# Patient Record
Sex: Female | Born: 1940 | Race: White | Hispanic: No | Smoking: Never smoker
Health system: Southern US, Community
[De-identification: ages and names within clinical notes are randomized; demographics above are authoritative.]

## PROBLEM LIST (undated history)

## (undated) DIAGNOSIS — F32A Depression, unspecified: Secondary | ICD-10-CM

## (undated) DIAGNOSIS — E039 Hypothyroidism, unspecified: Secondary | ICD-10-CM

## (undated) DIAGNOSIS — F329 Major depressive disorder, single episode, unspecified: Secondary | ICD-10-CM

## (undated) DIAGNOSIS — I2510 Atherosclerotic heart disease of native coronary artery without angina pectoris: Secondary | ICD-10-CM

## (undated) DIAGNOSIS — I639 Cerebral infarction, unspecified: Secondary | ICD-10-CM

## (undated) DIAGNOSIS — N289 Disorder of kidney and ureter, unspecified: Secondary | ICD-10-CM

## (undated) DIAGNOSIS — T8859XA Other complications of anesthesia, initial encounter: Secondary | ICD-10-CM

## (undated) DIAGNOSIS — I739 Peripheral vascular disease, unspecified: Secondary | ICD-10-CM

## (undated) DIAGNOSIS — N200 Calculus of kidney: Secondary | ICD-10-CM

## (undated) DIAGNOSIS — R5382 Chronic fatigue, unspecified: Secondary | ICD-10-CM

## (undated) DIAGNOSIS — I998 Other disorder of circulatory system: Secondary | ICD-10-CM

## (undated) DIAGNOSIS — D649 Anemia, unspecified: Secondary | ICD-10-CM

## (undated) DIAGNOSIS — Z992 Dependence on renal dialysis: Secondary | ICD-10-CM

## (undated) DIAGNOSIS — G629 Polyneuropathy, unspecified: Secondary | ICD-10-CM

## (undated) DIAGNOSIS — T884XXA Failed or difficult intubation, initial encounter: Secondary | ICD-10-CM

## (undated) DIAGNOSIS — M1990 Unspecified osteoarthritis, unspecified site: Secondary | ICD-10-CM

## (undated) DIAGNOSIS — N186 End stage renal disease: Secondary | ICD-10-CM

## (undated) DIAGNOSIS — G9332 Myalgic encephalomyelitis/chronic fatigue syndrome: Secondary | ICD-10-CM

## (undated) DIAGNOSIS — E669 Obesity, unspecified: Secondary | ICD-10-CM

## (undated) DIAGNOSIS — K5900 Constipation, unspecified: Secondary | ICD-10-CM

## (undated) DIAGNOSIS — E785 Hyperlipidemia, unspecified: Secondary | ICD-10-CM

## (undated) DIAGNOSIS — T4145XA Adverse effect of unspecified anesthetic, initial encounter: Secondary | ICD-10-CM

## (undated) DIAGNOSIS — I10 Essential (primary) hypertension: Secondary | ICD-10-CM

## (undated) DIAGNOSIS — M8580 Other specified disorders of bone density and structure, unspecified site: Secondary | ICD-10-CM

## (undated) DIAGNOSIS — J310 Chronic rhinitis: Secondary | ICD-10-CM

## (undated) HISTORY — DX: Other specified disorders of bone density and structure, unspecified site: M85.80

## (undated) HISTORY — DX: Major depressive disorder, single episode, unspecified: F32.9

## (undated) HISTORY — DX: Obesity, unspecified: E66.9

## (undated) HISTORY — DX: Depression, unspecified: F32.A

## (undated) HISTORY — DX: Hyperlipidemia, unspecified: E78.5

## (undated) HISTORY — PX: KNEE ARTHROSCOPY: SUR90

## (undated) HISTORY — DX: Essential (primary) hypertension: I10

## (undated) HISTORY — DX: Disorder of kidney and ureter, unspecified: N28.9

## (undated) HISTORY — DX: Chronic rhinitis: J31.0

## (undated) HISTORY — DX: Chronic fatigue, unspecified: R53.82

## (undated) HISTORY — PX: BACK SURGERY: SHX140

## (undated) HISTORY — PX: CARDIAC VALVE REPLACEMENT: SHX585

## (undated) HISTORY — PX: AORTIC VALVE REPLACEMENT: SHX41

## (undated) HISTORY — DX: Hypothyroidism, unspecified: E03.9

## (undated) HISTORY — DX: Anemia, unspecified: D64.9

## (undated) HISTORY — DX: Atherosclerotic heart disease of native coronary artery without angina pectoris: I25.10

## (undated) HISTORY — DX: Myalgic encephalomyelitis/chronic fatigue syndrome: G93.32

## (undated) HISTORY — DX: Unspecified osteoarthritis, unspecified site: M19.90

## (undated) HISTORY — PX: CORONARY ARTERY BYPASS GRAFT: SHX141

---

## 1974-11-09 HISTORY — PX: ABDOMINAL HYSTERECTOMY: SHX81

## 2001-05-02 ENCOUNTER — Encounter: Payer: Self-pay | Admitting: Emergency Medicine

## 2001-05-02 ENCOUNTER — Emergency Department (HOSPITAL_COMMUNITY): Admission: EM | Admit: 2001-05-02 | Discharge: 2001-05-02 | Payer: Self-pay | Admitting: Emergency Medicine

## 2009-02-27 ENCOUNTER — Encounter: Admission: RE | Admit: 2009-02-27 | Discharge: 2009-02-27 | Payer: Self-pay | Admitting: Orthopaedic Surgery

## 2009-12-26 ENCOUNTER — Ambulatory Visit (HOSPITAL_COMMUNITY): Admission: RE | Admit: 2009-12-26 | Discharge: 2009-12-26 | Payer: Self-pay | Admitting: Cardiology

## 2009-12-26 ENCOUNTER — Encounter: Payer: Self-pay | Admitting: Surgery

## 2009-12-26 ENCOUNTER — Ambulatory Visit: Payer: Self-pay | Admitting: Surgery

## 2010-01-21 ENCOUNTER — Ambulatory Visit: Payer: Self-pay | Admitting: Surgery

## 2010-01-22 ENCOUNTER — Encounter: Payer: Self-pay | Admitting: Surgery

## 2010-01-23 ENCOUNTER — Inpatient Hospital Stay (HOSPITAL_COMMUNITY): Admission: RE | Admit: 2010-01-23 | Discharge: 2010-02-01 | Payer: Self-pay | Admitting: Surgery

## 2010-01-23 ENCOUNTER — Ambulatory Visit: Payer: Self-pay | Admitting: Surgery

## 2010-01-23 ENCOUNTER — Encounter: Payer: Self-pay | Admitting: Surgery

## 2010-02-24 ENCOUNTER — Encounter: Admission: RE | Admit: 2010-02-24 | Discharge: 2010-02-24 | Payer: Self-pay | Admitting: Surgery

## 2010-02-24 ENCOUNTER — Ambulatory Visit: Payer: Self-pay | Admitting: Surgery

## 2011-01-30 LAB — CBC
HCT: 22.8 % — ABNORMAL LOW (ref 36.0–46.0)
HCT: 26 % — ABNORMAL LOW (ref 36.0–46.0)
HCT: 26.7 % — ABNORMAL LOW (ref 36.0–46.0)
HCT: 27.4 % — ABNORMAL LOW (ref 36.0–46.0)
HCT: 28.4 % — ABNORMAL LOW (ref 36.0–46.0)
HCT: 28.6 % — ABNORMAL LOW (ref 36.0–46.0)
HCT: 29 % — ABNORMAL LOW (ref 36.0–46.0)
HCT: 31.3 % — ABNORMAL LOW (ref 36.0–46.0)
Hemoglobin: 9 g/dL — ABNORMAL LOW (ref 12.0–15.0)
Hemoglobin: 9.3 g/dL — ABNORMAL LOW (ref 12.0–15.0)
Hemoglobin: 9.6 g/dL — ABNORMAL LOW (ref 12.0–15.0)
Hemoglobin: 9.7 g/dL — ABNORMAL LOW (ref 12.0–15.0)
MCHC: 33.9 g/dL (ref 30.0–36.0)
MCHC: 34 g/dL (ref 30.0–36.0)
MCHC: 34 g/dL (ref 30.0–36.0)
MCHC: 34.3 g/dL (ref 30.0–36.0)
MCV: 88.1 fL (ref 78.0–100.0)
MCV: 89.2 fL (ref 78.0–100.0)
MCV: 89.6 fL (ref 78.0–100.0)
MCV: 89.6 fL (ref 78.0–100.0)
Platelets: 123 10*3/uL — ABNORMAL LOW (ref 150–400)
Platelets: 141 10*3/uL — ABNORMAL LOW (ref 150–400)
Platelets: 156 10*3/uL (ref 150–400)
Platelets: 162 10*3/uL (ref 150–400)
Platelets: 170 10*3/uL (ref 150–400)
Platelets: 256 10*3/uL (ref 150–400)
Platelets: 295 10*3/uL (ref 150–400)
Platelets: 330 10*3/uL (ref 150–400)
RBC: 2.94 MIL/uL — ABNORMAL LOW (ref 3.87–5.11)
RBC: 2.97 MIL/uL — ABNORMAL LOW (ref 3.87–5.11)
RBC: 3.06 MIL/uL — ABNORMAL LOW (ref 3.87–5.11)
RBC: 3.26 MIL/uL — ABNORMAL LOW (ref 3.87–5.11)
RBC: 3.56 MIL/uL — ABNORMAL LOW (ref 3.87–5.11)
RDW: 15.1 % (ref 11.5–15.5)
RDW: 15.5 % (ref 11.5–15.5)
RDW: 15.5 % (ref 11.5–15.5)
RDW: 15.6 % — ABNORMAL HIGH (ref 11.5–15.5)
RDW: 15.6 % — ABNORMAL HIGH (ref 11.5–15.5)
RDW: 15.6 % — ABNORMAL HIGH (ref 11.5–15.5)
RDW: 15.8 % — ABNORMAL HIGH (ref 11.5–15.5)
RDW: 15.8 % — ABNORMAL HIGH (ref 11.5–15.5)
RDW: 16.3 % — ABNORMAL HIGH (ref 11.5–15.5)
WBC: 11.4 10*3/uL — ABNORMAL HIGH (ref 4.0–10.5)
WBC: 13 10*3/uL — ABNORMAL HIGH (ref 4.0–10.5)
WBC: 13.6 10*3/uL — ABNORMAL HIGH (ref 4.0–10.5)
WBC: 14 10*3/uL — ABNORMAL HIGH (ref 4.0–10.5)
WBC: 6.3 10*3/uL (ref 4.0–10.5)

## 2011-01-30 LAB — BASIC METABOLIC PANEL
BUN: 25 mg/dL — ABNORMAL HIGH (ref 6–23)
BUN: 37 mg/dL — ABNORMAL HIGH (ref 6–23)
BUN: 40 mg/dL — ABNORMAL HIGH (ref 6–23)
BUN: 45 mg/dL — ABNORMAL HIGH (ref 6–23)
BUN: 45 mg/dL — ABNORMAL HIGH (ref 6–23)
BUN: 46 mg/dL — ABNORMAL HIGH (ref 6–23)
CO2: 27 mEq/L (ref 19–32)
CO2: 28 mEq/L (ref 19–32)
CO2: 29 mEq/L (ref 19–32)
CO2: 30 mEq/L (ref 19–32)
Calcium: 8.1 mg/dL — ABNORMAL LOW (ref 8.4–10.5)
Calcium: 8.6 mg/dL (ref 8.4–10.5)
Calcium: 8.6 mg/dL (ref 8.4–10.5)
Calcium: 8.7 mg/dL (ref 8.4–10.5)
Calcium: 9.1 mg/dL (ref 8.4–10.5)
Chloride: 101 mEq/L (ref 96–112)
Chloride: 103 mEq/L (ref 96–112)
Chloride: 108 mEq/L (ref 96–112)
Chloride: 95 mEq/L — ABNORMAL LOW (ref 96–112)
Chloride: 99 mEq/L (ref 96–112)
Creatinine, Ser: 1.08 mg/dL (ref 0.4–1.2)
Creatinine, Ser: 1.09 mg/dL (ref 0.4–1.2)
Creatinine, Ser: 1.45 mg/dL — ABNORMAL HIGH (ref 0.4–1.2)
Creatinine, Ser: 1.64 mg/dL — ABNORMAL HIGH (ref 0.4–1.2)
Creatinine, Ser: 1.96 mg/dL — ABNORMAL HIGH (ref 0.4–1.2)
GFR calc Af Amer: 43 mL/min — ABNORMAL LOW (ref >60)
GFR calc Af Amer: >60 mL/min (ref >60)
GFR calc Af Amer: >60 mL/min (ref >60)
GFR calc non Af Amer: 27 mL/min — ABNORMAL LOW (ref >60)
GFR calc non Af Amer: 31 mL/min — ABNORMAL LOW (ref >60)
GFR calc non Af Amer: 36 mL/min — ABNORMAL LOW (ref >60)
Glucose, Bld: 113 mg/dL — ABNORMAL HIGH (ref 70–99)
Glucose, Bld: 116 mg/dL — ABNORMAL HIGH (ref 70–99)
Glucose, Bld: 116 mg/dL — ABNORMAL HIGH (ref 70–99)
Glucose, Bld: 122 mg/dL — ABNORMAL HIGH (ref 70–99)
Glucose, Bld: 125 mg/dL — ABNORMAL HIGH (ref 70–99)
Glucose, Bld: 141 mg/dL — ABNORMAL HIGH (ref 70–99)
Potassium: 4.1 mEq/L (ref 3.5–5.1)
Potassium: 4.2 mEq/L (ref 3.5–5.1)
Potassium: 4.2 mEq/L (ref 3.5–5.1)
Sodium: 135 mEq/L (ref 135–145)
Sodium: 136 mEq/L (ref 135–145)
Sodium: 137 mEq/L (ref 135–145)

## 2011-01-30 LAB — TYPE AND SCREEN
ABO/RH(D): A POS
Antibody Screen: NEGATIVE

## 2011-01-30 LAB — POCT I-STAT 3, ART BLOOD GAS (G3+)
Acid-base deficit: 1 mmol/L (ref 0.0–2.0)
Acid-base deficit: 2 mmol/L (ref 0.0–2.0)
Acid-base deficit: 4 mmol/L — ABNORMAL HIGH (ref 0.0–2.0)
Bicarbonate: 24.3 mEq/L — ABNORMAL HIGH (ref 20.0–24.0)
Bicarbonate: 27.6 mEq/L — ABNORMAL HIGH (ref 20.0–24.0)
Patient temperature: 36.2
Patient temperature: 37.3
TCO2: 25 mmol/L (ref 0–100)
TCO2: 25 mmol/L (ref 0–100)
TCO2: 29 mmol/L (ref 0–100)
pCO2 arterial: 45.8 mmHg — ABNORMAL HIGH (ref 35.0–45.0)
pCO2 arterial: 51.8 mmHg — ABNORMAL HIGH (ref 35.0–45.0)
pH, Arterial: 7.394 (ref 7.350–7.400)
pO2, Arterial: 74 mmHg — ABNORMAL LOW (ref 80.0–100.0)

## 2011-01-30 LAB — URINALYSIS, ROUTINE W REFLEX MICROSCOPIC
Bilirubin Urine: NEGATIVE
Bilirubin Urine: NEGATIVE
Glucose, UA: NEGATIVE mg/dL
Nitrite: NEGATIVE
Nitrite: NEGATIVE
Protein, ur: NEGATIVE mg/dL
Specific Gravity, Urine: 1.014 (ref 1.005–1.030)
Urobilinogen, UA: 1 mg/dL (ref 0.0–1.0)
pH: 5 (ref 5.0–8.0)

## 2011-01-30 LAB — URINE MICROSCOPIC-ADD ON

## 2011-01-30 LAB — URINE CULTURE
Colony Count: >=100000
Colony Count: >=100000

## 2011-01-30 LAB — POCT I-STAT 4, (NA,K, GLUC, HGB,HCT)
Glucose, Bld: 103 mg/dL — ABNORMAL HIGH (ref 70–99)
Glucose, Bld: 113 mg/dL — ABNORMAL HIGH (ref 70–99)
Glucose, Bld: 124 mg/dL — ABNORMAL HIGH (ref 70–99)
HCT: 24 % — ABNORMAL LOW (ref 36.0–46.0)
HCT: 25 % — ABNORMAL LOW (ref 36.0–46.0)
Hemoglobin: 8.2 g/dL — ABNORMAL LOW (ref 12.0–15.0)
Hemoglobin: 8.8 g/dL — ABNORMAL LOW (ref 12.0–15.0)
Hemoglobin: 8.8 g/dL — ABNORMAL LOW (ref 12.0–15.0)
Potassium: 4.1 mEq/L (ref 3.5–5.1)
Potassium: 4.2 mEq/L (ref 3.5–5.1)
Potassium: 4.3 mEq/L (ref 3.5–5.1)
Sodium: 137 mEq/L (ref 135–145)
Sodium: 138 mEq/L (ref 135–145)
Sodium: 139 mEq/L (ref 135–145)

## 2011-01-30 LAB — BLOOD GAS, ARTERIAL
Bicarbonate: 25.5 mEq/L — ABNORMAL HIGH (ref 20.0–24.0)
TCO2: 26.7 mmol/L (ref 0–100)
pO2, Arterial: 92.3 mmHg (ref 80.0–100.0)

## 2011-01-30 LAB — COMPREHENSIVE METABOLIC PANEL
ALT: 12 U/L (ref 0–35)
Albumin: 3.9 g/dL (ref 3.5–5.2)
BUN: 34 mg/dL — ABNORMAL HIGH (ref 6–23)
Calcium: 9.1 mg/dL (ref 8.4–10.5)
GFR calc non Af Amer: 44 mL/min — ABNORMAL LOW (ref >60)
Glucose, Bld: 102 mg/dL — ABNORMAL HIGH (ref 70–99)
Sodium: 135 mEq/L (ref 135–145)
Total Protein: 7.1 g/dL (ref 6.0–8.3)

## 2011-01-30 LAB — GLUCOSE, CAPILLARY
Glucose-Capillary: 103 mg/dL — ABNORMAL HIGH (ref 70–99)
Glucose-Capillary: 114 mg/dL — ABNORMAL HIGH (ref 70–99)
Glucose-Capillary: 125 mg/dL — ABNORMAL HIGH (ref 70–99)
Glucose-Capillary: 126 mg/dL — ABNORMAL HIGH (ref 70–99)
Glucose-Capillary: 157 mg/dL — ABNORMAL HIGH (ref 70–99)
Glucose-Capillary: 160 mg/dL — ABNORMAL HIGH (ref 70–99)

## 2011-01-30 LAB — APTT
aPTT: 33 seconds (ref 24–37)
aPTT: 40 seconds — ABNORMAL HIGH (ref 24–37)

## 2011-01-30 LAB — PROTIME-INR
INR: 1 (ref 0.00–1.49)
INR: 1.43 (ref 0.00–1.49)
Prothrombin Time: 17.3 seconds — ABNORMAL HIGH (ref 11.6–15.2)

## 2011-01-30 LAB — CREATININE, SERUM
Creatinine, Ser: 1.11 mg/dL (ref 0.4–1.2)
GFR calc Af Amer: 59 mL/min — ABNORMAL LOW (ref >60)

## 2011-01-30 LAB — POCT I-STAT 3, VENOUS BLOOD GAS (G3P V)
O2 Saturation: 89 %
TCO2: 28 mmol/L (ref 0–100)
pCO2, Ven: 52.2 mmHg — ABNORMAL HIGH (ref 45.0–50.0)

## 2011-01-30 LAB — ABO/RH: ABO/RH(D): A POS

## 2011-01-30 LAB — POCT I-STAT, CHEM 8
Calcium, Ion: 0.99 mmol/L — ABNORMAL LOW (ref 1.12–1.32)
Creatinine, Ser: 1.1 mg/dL (ref 0.4–1.2)
Glucose, Bld: 143 mg/dL — ABNORMAL HIGH (ref 70–99)
Hemoglobin: 9.2 g/dL — ABNORMAL LOW (ref 12.0–15.0)
Sodium: 137 mEq/L (ref 135–145)
TCO2: 25 mmol/L (ref 0–100)

## 2011-01-30 LAB — SURGICAL PCR SCREEN: MRSA, PCR: NEGATIVE

## 2011-01-30 LAB — MRSA PCR SCREENING: MRSA by PCR: NEGATIVE

## 2011-01-30 LAB — POCT I-STAT GLUCOSE: Glucose, Bld: 115 mg/dL — ABNORMAL HIGH (ref 70–99)

## 2011-03-24 NOTE — Assessment & Plan Note (Signed)
OFFICE VISIT   Jill Johnson, Jill Johnson  DOB:  04-Nov-1941                                        February 24, 2010  CHART #:  [id number]   REASON FOR OFFICE VISIT:  Routine followup status post CABG x3 and AVR  on January 23, 2010.   HISTORY OF PRESENT ILLNESS:  This is a 70 year old Caucasian female with  a past medical history of moderate AS, hypertension, hyperlipidemia,  DJD, history of TIA, history of chronic fatigue syndrome who had been  experiencing substernal chest discomfort and associated shortness of  breath that occurred with exertion.  A thorough workup was undertaken  and she was found to have moderate AS, as well as multivessel coronary  artery disease.  She underwent a CABG x3 (LIMA to LAD, SVG to diagonal,  SVG to PDA) as well as an AVR using a 21-mm Edwards pericardial Magna  Ease valve by Dr. Laneta Simmers on January 23, 2010.  The patient was discharged  from [facility] on January 30, 2010, in stable condition.  The patient's only complaints at this time include:  1.  Pain in  bilateral knees (the patient with a history of degenerative arthritis)  as well as occasional gait and balance.  The patient denies any chest  pain, shortness of breath, fever or chills.  The patient denies any  blurry or double vision, denies any upper or lower extremity weakness.  Denies any difficulty in speech.   PHYSICAL EXAMINATION:  General:  This is a pleasant 70 year old  Caucasian female who is in no acute distress.  Vital signs are as  follows:  BP 140/66, heart rate 71, respirations 18, O2 sat 95% on room  air.  Cardiovascular:  Regular rate and rhythm, S1 and S2 with a slight  systolic murmur.  Pulmonary:  Clear to auscultation bilaterally.  No  rales, wheezes or rhonchi.  Abdomen:  Soft, nontender.  Bowel sounds  present.  Extremities:  No cyanosis, clubbing or edema.  Right lower  extremity wound and sternal wounds are clean, dry and continuing to  heal.  No  signs of infection.  Some slight tenderness just distal to the  right _________ site.  NEURO:  Cranial nerves II through XII grossly  intact without any focal deficits.   IMPRESSION AND PLAN:  Overall, the patient surgically stable status post  coronary artery bypass graft x3 and aortic valve replacement.  She will  be seen by Dr. Laneta Simmers on a p.r.n. basis.  She has already followed up  with Dr. Anne Fu and will continue to be followed by him closely.  Regarding the patient's occasional gait instability, I have recommended  that she follow up with Dr. Kevan Ny, her primary medical doctor.  She was  instructed on potential sinus symptoms of stroke and to seek medical  treatment immediately (as the patient has a history of previous  transient ischemic attacks).  None of these symptoms grouped at the time  of this physical examination.  Regarding the patient's history  degenerative joint disease, her Mobic has been restarted, she will take  them preoperatively.  There are no changes to her medications but the  patient was instructed to continue the iron until it is finished but do  not refill this.  In addition, Dr. Anne Fu had stopped the Lopressor.  The patient is  on Cozaar 50 mg p.o. daily and hydrochlorothiazide 12.5  mg p.o. daily, however.  Finally, the patient was instructed may be can  driving short distances during the day and gradually the next couple of  weeks increase the duration and frequency (the patient is currently not  on a narcotics).  Also, the patient was instructed to continue with  sternal precautions for the next 3-4 weeks i.e. (no lifting more than 10  pounds).  Finally, the patient was encouraged to begin cardiac rehab  when she has decreased pain in her knees.   Evelene Croon, M.D.  Electronically Signed   DZ/MEDQ  D:  02/24/2010  T:  02/25/2010  Job:  [id number]   cc:   [name], MD  Evelene Croon, M.D.

## 2013-12-08 ENCOUNTER — Telehealth: Payer: Self-pay | Admitting: *Deleted

## 2013-12-08 MED ORDER — AMLODIPINE BESYLATE 10 MG PO TABS: 10.0000 mg | ORAL_TABLET | Freq: Every day | ORAL | Status: DC

## 2013-12-08 NOTE — Telephone Encounter (Signed)
Rx refilled.

## 2013-12-08 NOTE — Telephone Encounter (Signed)
[facility] in [place] requests amlodipine refill. Thanks, MI

## 2014-05-10 ENCOUNTER — Other Ambulatory Visit: Payer: Self-pay

## 2014-05-10 MED ORDER — AMLODIPINE BESYLATE 10 MG PO TABS: 10.0000 mg | ORAL_TABLET | Freq: Every day | ORAL | Status: AC

## 2014-06-23 ENCOUNTER — Encounter: Payer: Self-pay | Admitting: *Deleted

## 2014-08-22 ENCOUNTER — Other Ambulatory Visit: Payer: Self-pay | Admitting: Internal Medicine

## 2014-08-22 DIAGNOSIS — R7989 Other specified abnormal findings of blood chemistry: Secondary | ICD-10-CM

## 2014-08-31 ENCOUNTER — Other Ambulatory Visit: Payer: Self-pay

## 2014-10-09 ENCOUNTER — Ambulatory Visit: Payer: Self-pay | Admitting: Nurse Practitioner

## 2014-11-12 ENCOUNTER — Ambulatory Visit (INDEPENDENT_AMBULATORY_CARE_PROVIDER_SITE_OTHER): Payer: Commercial Managed Care - HMO

## 2014-11-12 ENCOUNTER — Ambulatory Visit (INDEPENDENT_AMBULATORY_CARE_PROVIDER_SITE_OTHER): Payer: Commercial Managed Care - HMO | Admitting: Family Medicine

## 2014-11-12 VITALS — BP 160/60 | HR 64 | Temp 97.5°F | Resp 16

## 2014-11-12 DIAGNOSIS — T148 Other injury of unspecified body region: Secondary | ICD-10-CM | POA: Diagnosis not present

## 2014-11-12 DIAGNOSIS — T148XXA Other injury of unspecified body region, initial encounter: Secondary | ICD-10-CM

## 2014-11-12 DIAGNOSIS — S0083XA Contusion of other part of head, initial encounter: Secondary | ICD-10-CM

## 2014-11-12 LAB — POCT CBC
Granulocyte percent: 69 %G (ref 37–80)
HCT, POC: 30.8 % — AB (ref 37.7–47.9)
HEMOGLOBIN: 10 g/dL — AB (ref 12.2–16.2)
LYMPH, POC: 3.1 (ref 0.6–3.4)
MCH: 29.3 pg (ref 27–31.2)
MCHC: 32.3 g/dL (ref 31.8–35.4)
MCV: 90.5 fL (ref 80–97)
MID (CBC): 0.8 (ref 0–0.9)
MPV: 8.2 fL (ref 0–99.8)
PLATELET COUNT, POC: 347 10*3/uL (ref 142–424)
POC Granulocyte: 8.6 — AB (ref 2–6.9)
POC LYMPH PERCENT: 24.6 %L (ref 10–50)
POC MID %: 6.4 %M (ref 0–12)
RBC: 3.41 M/uL — AB (ref 4.04–5.48)
RDW, POC: 14.4 %
WBC: 12.4 10*3/uL — AB (ref 4.6–10.2)

## 2014-11-12 NOTE — Progress Notes (Addendum)
Subjective 74 year old lady who for 5 days ago tripped and fell going through her house to the bathroom. She hit her right forehead on the door frame. She was not knocked out. She did fall down. She had to crawl into her bedroom and called her grandson came in helped her up. She had no loss of consciousness. She had some pain in her right forehead. She subsequently developed bruising around her right eye and in her left. The bruising tracked all the way down the right side of the face and down into the neck. She said everybody Negative her so she came in to see the doctor finally. She has felt okay, wondered whether it was still bleeding into the wound. She did not actually have a laceration had a tiny bit of blood on the skin. She does take aspirin. She is a retired Engineer, civil (consulting).  Objective: 2 cm crease visible on the right forehead where she struck the door frame. There is a tiny scab that. Has the large area of black and blue around both eyes and down the right side of the nose and down to the neck on the right. Some bruising around the left cheek. She has mild tenderness at the orbits. The bridge of the nose is a little bit tender.  Assessment: Forehead injury with extensive bruising  Plan: CBC Try and get an old labs to compare with [facility] x-rays  Results for orders placed or performed in visit on 11/12/14  POCT CBC  Result Value Ref Range   WBC 12.4 (A) 4.6 - 10.2 K/uL   Lymph, poc 3.1 0.6 - 3.4   POC LYMPH PERCENT 24.6 10 - 50 %L   MID (cbc) 0.8 0 - 0.9   POC MID % 6.4 0 - 12 %M   POC Granulocyte 8.6 (A) 2 - 6.9   Granulocyte percent 69.0 37 - 80 %G   RBC 3.41 (A) 4.04 - 5.48 M/uL   Hemoglobin 10.0 (A) 12.2 - 16.2 g/dL   HCT, POC 16.1 (A) 09.6 - 47.9 %   MCV 90.5 80 - 97 fL   MCH, POC 29.3 27 - 31.2 pg   MCHC 32.3 31.8 - 35.4 g/dL   RDW, POC 04.5 %   Platelet Count, POC 347 142 - 424 K/uL   MPV 8.2 0 - 99.8 fL   [facility] reading (PRIMARY) by  Dr. Alwyn Ren Normal orbit and face and skull.   Possibly a little fluid right maxillary sinus.  Marland Kitchen

## 2014-11-12 NOTE — Patient Instructions (Signed)
Stay off the aspirin for the next few days. Resume about Friday  Apply ice several times daily for the next couple of days  The bruise will probably spread more before he gets better.  Return if any concerns

## 2015-01-16 DIAGNOSIS — M1711 Unilateral primary osteoarthritis, right knee: Secondary | ICD-10-CM | POA: Diagnosis not present

## 2015-01-16 DIAGNOSIS — M1712 Unilateral primary osteoarthritis, left knee: Secondary | ICD-10-CM | POA: Diagnosis not present

## 2015-05-09 DIAGNOSIS — N184 Chronic kidney disease, stage 4 (severe): Secondary | ICD-10-CM | POA: Diagnosis not present

## 2015-05-09 DIAGNOSIS — I10 Essential (primary) hypertension: Secondary | ICD-10-CM | POA: Diagnosis not present

## 2015-05-09 DIAGNOSIS — I8310 Varicose veins of unspecified lower extremity with inflammation: Secondary | ICD-10-CM | POA: Diagnosis not present

## 2015-05-09 DIAGNOSIS — R350 Frequency of micturition: Secondary | ICD-10-CM | POA: Diagnosis not present

## 2015-05-09 DIAGNOSIS — K219 Gastro-esophageal reflux disease without esophagitis: Secondary | ICD-10-CM | POA: Diagnosis not present

## 2015-05-09 DIAGNOSIS — E039 Hypothyroidism, unspecified: Secondary | ICD-10-CM | POA: Diagnosis not present

## 2015-05-09 DIAGNOSIS — G609 Hereditary and idiopathic neuropathy, unspecified: Secondary | ICD-10-CM | POA: Diagnosis not present

## 2015-05-09 DIAGNOSIS — Z79899 Other long term (current) drug therapy: Secondary | ICD-10-CM | POA: Diagnosis not present

## 2015-05-16 DIAGNOSIS — I10 Essential (primary) hypertension: Secondary | ICD-10-CM | POA: Diagnosis not present

## 2015-09-18 DIAGNOSIS — M1712 Unilateral primary osteoarthritis, left knee: Secondary | ICD-10-CM | POA: Diagnosis not present

## 2015-09-18 DIAGNOSIS — M1711 Unilateral primary osteoarthritis, right knee: Secondary | ICD-10-CM | POA: Diagnosis not present

## 2015-11-13 ENCOUNTER — Other Ambulatory Visit: Payer: Self-pay | Admitting: Internal Medicine

## 2015-11-13 ENCOUNTER — Ambulatory Visit
Admission: RE | Admit: 2015-11-13 | Discharge: 2015-11-13 | Disposition: A | Discharge status: Home / Self Care | Payer: Commercial Managed Care - HMO | Source: Ambulatory Visit | Attending: Internal Medicine | Admitting: Internal Medicine

## 2015-11-13 DIAGNOSIS — R634 Abnormal weight loss: Secondary | ICD-10-CM | POA: Diagnosis not present

## 2015-11-13 DIAGNOSIS — R231 Pallor: Secondary | ICD-10-CM | POA: Diagnosis not present

## 2015-11-13 DIAGNOSIS — R630 Anorexia: Secondary | ICD-10-CM

## 2015-11-13 DIAGNOSIS — R5383 Other fatigue: Secondary | ICD-10-CM | POA: Diagnosis not present

## 2015-11-13 DIAGNOSIS — R0609 Other forms of dyspnea: Secondary | ICD-10-CM

## 2015-11-13 DIAGNOSIS — F329 Major depressive disorder, single episode, unspecified: Secondary | ICD-10-CM | POA: Diagnosis not present

## 2015-11-13 DIAGNOSIS — Z79899 Other long term (current) drug therapy: Secondary | ICD-10-CM | POA: Diagnosis not present

## 2015-11-13 DIAGNOSIS — R0602 Shortness of breath: Secondary | ICD-10-CM | POA: Diagnosis not present

## 2015-11-20 DIAGNOSIS — R600 Localized edema: Secondary | ICD-10-CM | POA: Diagnosis not present

## 2015-11-20 DIAGNOSIS — R5383 Other fatigue: Secondary | ICD-10-CM | POA: Diagnosis not present

## 2015-11-20 DIAGNOSIS — R634 Abnormal weight loss: Secondary | ICD-10-CM | POA: Diagnosis not present

## 2015-11-20 DIAGNOSIS — R0609 Other forms of dyspnea: Secondary | ICD-10-CM | POA: Diagnosis not present

## 2015-11-20 DIAGNOSIS — D649 Anemia, unspecified: Secondary | ICD-10-CM | POA: Diagnosis not present

## 2015-11-20 DIAGNOSIS — N390 Urinary tract infection, site not specified: Secondary | ICD-10-CM | POA: Diagnosis not present

## 2015-11-20 DIAGNOSIS — R630 Anorexia: Secondary | ICD-10-CM | POA: Diagnosis not present

## 2015-11-20 DIAGNOSIS — N184 Chronic kidney disease, stage 4 (severe): Secondary | ICD-10-CM | POA: Diagnosis not present

## 2015-11-20 DIAGNOSIS — R231 Pallor: Secondary | ICD-10-CM | POA: Diagnosis not present

## 2015-12-02 DIAGNOSIS — F329 Major depressive disorder, single episode, unspecified: Secondary | ICD-10-CM | POA: Diagnosis not present

## 2015-12-02 DIAGNOSIS — N184 Chronic kidney disease, stage 4 (severe): Secondary | ICD-10-CM | POA: Diagnosis not present

## 2015-12-02 DIAGNOSIS — I10 Essential (primary) hypertension: Secondary | ICD-10-CM | POA: Diagnosis not present

## 2015-12-02 DIAGNOSIS — R809 Proteinuria, unspecified: Secondary | ICD-10-CM | POA: Diagnosis not present

## 2015-12-03 ENCOUNTER — Other Ambulatory Visit: Payer: Self-pay | Admitting: Nephrology

## 2015-12-03 DIAGNOSIS — N184 Chronic kidney disease, stage 4 (severe): Secondary | ICD-10-CM

## 2015-12-05 ENCOUNTER — Ambulatory Visit
Admission: RE | Admit: 2015-12-05 | Discharge: 2015-12-05 | Disposition: A | Discharge status: Home / Self Care | Payer: Commercial Managed Care - HMO | Source: Ambulatory Visit | Attending: Nephrology | Admitting: Nephrology

## 2015-12-05 DIAGNOSIS — N189 Chronic kidney disease, unspecified: Secondary | ICD-10-CM | POA: Diagnosis not present

## 2015-12-05 DIAGNOSIS — N184 Chronic kidney disease, stage 4 (severe): Secondary | ICD-10-CM

## 2015-12-09 ENCOUNTER — Emergency Department (HOSPITAL_COMMUNITY)
Admission: EM | Admit: 2015-12-09 | Discharge: 2015-12-09 | Disposition: A | Discharge status: Home / Self Care | Payer: Commercial Managed Care - HMO | Attending: Emergency Medicine | Admitting: Emergency Medicine

## 2015-12-09 DIAGNOSIS — Z79899 Other long term (current) drug therapy: Secondary | ICD-10-CM | POA: Insufficient documentation

## 2015-12-09 DIAGNOSIS — M1990 Unspecified osteoarthritis, unspecified site: Secondary | ICD-10-CM | POA: Insufficient documentation

## 2015-12-09 DIAGNOSIS — B9689 Other specified bacterial agents as the cause of diseases classified elsewhere: Secondary | ICD-10-CM | POA: Diagnosis not present

## 2015-12-09 DIAGNOSIS — Z8709 Personal history of other diseases of the respiratory system: Secondary | ICD-10-CM | POA: Insufficient documentation

## 2015-12-09 DIAGNOSIS — E669 Obesity, unspecified: Secondary | ICD-10-CM | POA: Diagnosis not present

## 2015-12-09 DIAGNOSIS — I10 Essential (primary) hypertension: Secondary | ICD-10-CM | POA: Diagnosis not present

## 2015-12-09 DIAGNOSIS — R2241 Localized swelling, mass and lump, right lower limb: Secondary | ICD-10-CM | POA: Diagnosis present

## 2015-12-09 DIAGNOSIS — F329 Major depressive disorder, single episode, unspecified: Secondary | ICD-10-CM | POA: Diagnosis not present

## 2015-12-09 DIAGNOSIS — L259 Unspecified contact dermatitis, unspecified cause: Secondary | ICD-10-CM | POA: Diagnosis not present

## 2015-12-09 DIAGNOSIS — M8580 Other specified disorders of bone density and structure, unspecified site: Secondary | ICD-10-CM | POA: Insufficient documentation

## 2015-12-09 DIAGNOSIS — I2510 Atherosclerotic heart disease of native coronary artery without angina pectoris: Secondary | ICD-10-CM | POA: Diagnosis not present

## 2015-12-09 DIAGNOSIS — Z7982 Long term (current) use of aspirin: Secondary | ICD-10-CM | POA: Diagnosis not present

## 2015-12-09 DIAGNOSIS — Z87448 Personal history of other diseases of urinary system: Secondary | ICD-10-CM | POA: Insufficient documentation

## 2015-12-09 DIAGNOSIS — L03115 Cellulitis of right lower limb: Secondary | ICD-10-CM | POA: Diagnosis not present

## 2015-12-09 DIAGNOSIS — E039 Hypothyroidism, unspecified: Secondary | ICD-10-CM | POA: Insufficient documentation

## 2015-12-09 MED ORDER — PREDNISONE 20 MG PO TABS: ORAL_TABLET | ORAL | Status: DC

## 2015-12-09 MED ORDER — HYDROXYZINE HCL 10 MG PO TABS: 10.0000 mg | ORAL_TABLET | Freq: Three times a day (TID) | ORAL | Status: DC | PRN

## 2015-12-09 MED ORDER — CEPHALEXIN 500 MG PO CAPS: 500.0000 mg | ORAL_CAPSULE | Freq: Four times a day (QID) | ORAL | Status: DC

## 2015-12-09 MED ORDER — HYDROXYZINE HCL 25 MG PO TABS: 25.0000 mg | ORAL_TABLET | Freq: Three times a day (TID) | ORAL | Status: DC | PRN

## 2015-12-09 MED ORDER — PREDNISONE 20 MG PO TABS
60.0000 mg | ORAL_TABLET | Freq: Once | ORAL | Status: AC
  Administered 2015-12-09: 60 mg via ORAL
  Filled 2015-12-09: qty 3

## 2015-12-09 NOTE — Discharge Instructions (Signed)
Read the information below.  Use the prescribed medication as directed.  Please discuss all new medications with your pharmacist.  You may return to the Emergency Department at any time for worsening condition or any new symptoms that concern you.    If you develop increased redness, swelling, pus draining from the wound, or fevers greater than 100.4, return to the ER immediately for a recheck.      Contact Dermatitis Dermatitis is redness, soreness, and swelling (inflammation) of the skin. Contact dermatitis is a reaction to certain substances that touch the skin. There are two types of contact dermatitis:   Irritant contact dermatitis. This type is caused by something that irritates your skin, such as dry hands from washing them too much. This type does not require previous exposure to the substance for a reaction to occur. This type is more common.  Allergic contact dermatitis. This type is caused by a substance that you are allergic to, such as a nickel allergy or poison ivy. This type only occurs if you have been exposed to the substance (allergen) before. Upon a repeat exposure, your body reacts to the substance. This type is less common. CAUSES  Many different substances can cause contact dermatitis. Irritant contact dermatitis is most commonly caused by exposure to:   Makeup.   Soaps.   Detergents.   Bleaches.   Acids.   Metal salts, such as nickel.  Allergic contact dermatitis is most commonly caused by exposure to:   Poisonous plants.   Chemicals.   Jewelry.   Latex.   Medicines.   Preservatives in products, such as clothing.  RISK FACTORS This condition is more likely to develop in:   People who have jobs that expose them to irritants or allergens.  People who have certain medical conditions, such as asthma or eczema.  SYMPTOMS  Symptoms of this condition may occur anywhere on your body where the irritant has touched you or is touched by you.  Symptoms include:  Dryness or flaking.   Redness.   Cracks.   Itching.   Pain or a burning feeling.   Blisters.  Drainage of small amounts of blood or clear fluid from skin cracks. With allergic contact dermatitis, there may also be swelling in areas such as the eyelids, mouth, or genitals.  DIAGNOSIS  This condition is diagnosed with a medical history and physical exam. A patch skin test may be performed to help determine the cause. If the condition is related to your job, you may need to see an occupational medicine specialist. TREATMENT Treatment for this condition includes figuring out what caused the reaction and protecting your skin from further contact. Treatment may also include:   Steroid creams or ointments. Oral steroid medicines may be needed in more severe cases.  Antibiotics or antibacterial ointments, if a skin infection is present.  Antihistamine lotion or an antihistamine taken by mouth to ease itching.  A bandage (dressing). HOME CARE INSTRUCTIONS Skin Care  Moisturize your skin as needed.   Apply cool compresses to the affected areas.  Try taking a bath with:  Epsom salts. Follow the instructions on the packaging. You can get these at your local pharmacy or grocery store.  Baking soda. Pour a small amount into the bath as directed by your health care provider.  Colloidal oatmeal. Follow the instructions on the packaging. You can get this at your local pharmacy or grocery store.  Try applying baking soda paste to your skin. Stir water into baking soda  until it reaches a paste-like consistency.  Do not scratch your skin.  Bathe less frequently, such as every other day.  Bathe in lukewarm water. Avoid using hot water. Medicines  Take or apply over-the-counter and prescription medicines only as told by your health care provider.   If you were prescribed an antibiotic medicine, take or apply your antibiotic as told by your health care  provider. Do not stop using the antibiotic even if your condition starts to improve. General Instructions  Keep all follow-up visits as told by your health care provider. This is important.  Avoid the substance that caused your reaction. If you do not know what caused it, keep a journal to try to track what caused it. Write down:  What you eat.  What cosmetic products you use.  What you drink.  What you wear in the affected area. This includes jewelry.  If you were given a dressing, take care of it as told by your health care provider. This includes when to change and remove it. SEEK MEDICAL CARE IF:   Your condition does not improve with treatment.  Your condition gets worse.  You have signs of infection such as swelling, tenderness, redness, soreness, or warmth in the affected area.  You have a fever.  You have new symptoms. SEEK IMMEDIATE MEDICAL CARE IF:   You have a severe headache, neck pain, or neck stiffness.  You vomit.  You feel very sleepy.  You notice red streaks coming from the affected area.  Your bone or joint underneath the affected area becomes painful after the skin has healed.  The affected area turns darker.  You have difficulty breathing.   This information is not intended to replace advice given to you by your health care provider. Make sure you discuss any questions you have with your health care provider.   Document Released: 10/23/2000 Document Revised: 07/17/2015 Document Reviewed: 03/13/2015 Elsevier Interactive Patient Education 2016 [employer]  Cellulitis Cellulitis is an infection of the skin and the tissue beneath it. The infected area is usually red and tender. Cellulitis occurs most often in the arms and lower legs.  CAUSES  Cellulitis is caused by bacteria that enter the skin through cracks or cuts in the skin. The most common types of bacteria that cause cellulitis are staphylococci and streptococci. SIGNS AND SYMPTOMS    Redness and warmth.  Swelling.  Tenderness or pain.  Fever. DIAGNOSIS  Your health care provider can usually determine what is wrong based on a physical exam. Blood tests may also be done. TREATMENT  Treatment usually involves taking an antibiotic medicine. HOME CARE INSTRUCTIONS   Take your antibiotic medicine as directed by your health care provider. Finish the antibiotic even if you start to feel better.  Keep the infected arm or leg elevated to reduce swelling.  Apply a warm cloth to the affected area up to 4 times per day to relieve pain.  Take medicines only as directed by your health care provider.  Keep all follow-up visits as directed by your health care provider. SEEK MEDICAL CARE IF:   You notice red streaks coming from the infected area.  Your red area gets larger or turns dark in color.  Your bone or joint underneath the infected area becomes painful after the skin has healed.  Your infection returns in the same area or another area.  You notice a swollen bump in the infected area.  You develop new symptoms.  You have a fever. SEEK  IMMEDIATE MEDICAL CARE IF:   You feel very sleepy.  You develop vomiting or diarrhea.  You have a general ill feeling (malaise) with muscle aches and pains.   This information is not intended to replace advice given to you by your health care provider. Make sure you discuss any questions you have with your health care provider.   Document Released: 08/05/2005 Document Revised: 07/17/2015 Document Reviewed: 01/11/2012 Elsevier Interactive Patient Education [employer]

## 2015-12-09 NOTE — ED Notes (Signed)
Into assess pt, pt declines to get in the bed and change into a gown for assessment at this time.

## 2015-12-09 NOTE — ED Provider Notes (Signed)
CSN: [id number]     Arrival date & time 12/09/15  1951 History   First MD Initiated Contact with Patient 12/09/15 2105     Chief Complaint  Patient presents with  . Leg Swelling     (Consider location/radiation/quality/duration/timing/severity/associated sxs/prior Treatment) The history is provided by the patient.     Pt with hx CAD, obesity, bilateral knee DJD p/w right lower leg pruritic rash.  The rash developed approximately 6 months and was pink, itchy.  It was treated with steroid cream from her PCP with some improvement.  The rash returned and today the pinkness turned to a much deeper red and blisters developed.  She denies fevers, CP, SOB, unilateral leg swelling.  Denies any possibility of exposure of this leg to any allergen.   Denies any pain at all.  Notes the wounds are from scratching her leg in her sleep.    Past Medical History  Diagnosis Date  . CAD (coronary artery disease)   . DJD (degenerative joint disease)   . Osteopenia   . Hypertension   . Depression   . Hypothyroidism   . CFS (chronic fatigue syndrome)   . Chronic rhinitis   . Obesity   . Renal insufficiency     Post Op  . Hyperlipidemia   . Coronary atherosclerosis    Past Surgical History  Procedure Laterality Date  . Cardiac valve replacement     Family History  Problem Relation Age of Onset  . Alzheimer's disease Mother   . Aortic stenosis Father    Social History  Substance Use Topics  . Smoking status: Never Smoker   . Smokeless tobacco: Not on file  . Alcohol Use: No   OB History    No data available     Review of Systems  All other systems reviewed and are negative.     Allergies  Review of patient's allergies indicates no known allergies.  Home Medications   Prior to Admission medications   Medication Sig Start Date End Date Taking? Authorizing Provider  acetaminophen (TYLENOL) 650 MG CR tablet Take 650 mg by mouth every 8 (eight) hours as needed for pain.   Yes  Historical Provider, MD  amLODipine (NORVASC) 10 MG tablet Take 1 tablet (10 mg total) by mouth daily. 05/10/14  Yes [name], MD  aspirin 325 MG tablet Take 325 mg by mouth daily.   Yes Historical Provider, MD  cholecalciferol (VITAMIN D) 1000 units tablet Take 1,000 Units by mouth daily.   Yes Historical Provider, MD  fenofibrate 160 MG tablet Take 160 mg by mouth daily.   Yes Historical Provider, MD  FLUoxetine (PROZAC) 10 MG capsule Take 10 mg by mouth daily.   Yes Historical Provider, MD  furosemide (LASIX) 40 MG tablet Take 40 mg by mouth daily.   Yes Historical Provider, MD  gabapentin (NEURONTIN) 300 MG capsule Take 300 mg by mouth 3 (three) times daily.   Yes Historical Provider, MD  Glucosamine-Chondroit-Vit C-Mn (GLUCOSAMINE CHONDR 1500 COMPLX PO) Take 2 tablets by mouth daily.    Yes Historical Provider, MD  levothyroxine (SYNTHROID, LEVOTHROID) 100 MCG tablet Take 100 mcg by mouth daily before breakfast.   Yes Historical Provider, MD  Magnesium Hydroxide (MAGNESIA PO) Take 1 tablet by mouth at bedtime.    Yes Historical Provider, MD   BP 196/70 mmHg  Temp(Src) 98 F (36.7 C) (Oral)  Resp 18  Ht 5' (1.524 m)  Wt 74.844 kg  BMI 32.22 kg/m2 Physical Exam  Constitutional: She appears well-developed and well-nourished. No distress.  HENT:  Head: Normocephalic and atraumatic.  Neck: Neck supple.  Pulmonary/Chest: Effort normal.  Musculoskeletal:  Right lower leg with erythema and chronic wounds over shin and posterior lower leg with blistering.  Leg is nontender.  Mild edema throughout similar to left leg.  Distal pulses intact.    Neurological: She is alert.  Skin: She is not diaphoretic.  Nursing note and vitals reviewed.   ED Course  Procedures (including critical care time) Labs Review Labs Reviewed - No data to display  Imaging Review No results found. I have personally reviewed and evaluated these images and lab results as part of my medical decision-making.    EKG Interpretation None       9:52 PM Discussed pt with Dr Juleen China who will also see and examine the patient.   MDM   Final diagnoses:  Contact dermatitis  Cellulitis of right lower extremity    Afebrile, nontoxic patient with pruritic rash with blisters and chronic wounds over right lower leg.  No fever, no pain, no discharge.  Pt also seen and examined by Dr Juleen China. Dr Juleen China advised no topical treatment at this time.  Doubt DVT, venous stasis skin changes.  Suspect contact dermatitis or dermatitis from unknown source with possible early cellulitis.  D/C home with prednisone, keflex, Vistaril. PCP follow up.   Discussed result, findings, treatment, and follow up  with patient.  Pt given return precautions.  Pt verbalizes understanding and agrees with plan.         Trixie Dredge, PA-C 12/10/15 0119  Raeford Razor, MD 12/11/15 [phone]

## 2015-12-09 NOTE — ED Notes (Signed)
MD at bedside. 

## 2015-12-09 NOTE — ED Notes (Signed)
Pt states that she noticed that her R lower leg became pink 1 month ago but since yesterday it has become red and now has blisters. Alert and oriented.

## 2015-12-10 ENCOUNTER — Encounter: Payer: Self-pay | Admitting: Cardiology

## 2015-12-11 DIAGNOSIS — R809 Proteinuria, unspecified: Secondary | ICD-10-CM | POA: Diagnosis not present

## 2015-12-20 ENCOUNTER — Ambulatory Visit (INDEPENDENT_AMBULATORY_CARE_PROVIDER_SITE_OTHER): Payer: Commercial Managed Care - HMO | Admitting: Cardiology

## 2015-12-20 ENCOUNTER — Encounter: Payer: Self-pay | Admitting: Cardiology

## 2015-12-20 VITALS — BP 180/78 | HR 65 | Ht 59.0 in | Wt 156.8 lb

## 2015-12-20 DIAGNOSIS — I2510 Atherosclerotic heart disease of native coronary artery without angina pectoris: Secondary | ICD-10-CM | POA: Insufficient documentation

## 2015-12-20 DIAGNOSIS — I2583 Coronary atherosclerosis due to lipid rich plaque: Secondary | ICD-10-CM

## 2015-12-20 DIAGNOSIS — Z954 Presence of other heart-valve replacement: Secondary | ICD-10-CM

## 2015-12-20 DIAGNOSIS — N183 Chronic kidney disease, stage 3 unspecified: Secondary | ICD-10-CM | POA: Insufficient documentation

## 2015-12-20 DIAGNOSIS — I5031 Acute diastolic (congestive) heart failure: Secondary | ICD-10-CM | POA: Diagnosis not present

## 2015-12-20 DIAGNOSIS — Z952 Presence of prosthetic heart valve: Secondary | ICD-10-CM | POA: Insufficient documentation

## 2015-12-20 DIAGNOSIS — E669 Obesity, unspecified: Secondary | ICD-10-CM

## 2015-12-20 NOTE — Progress Notes (Signed)
Cardiology Office Note    Date:  12/20/2015   ID:  Jill Johnson, DOB 05-28-1941, MRN [id number]  PCP:  Hollice Espy, MD  Cardiologist:   Donato Schultz, MD   No chief complaint on file.   History of Present Illness:  Jill Johnson is a 75 y.o. female  With coronary artery disease status post aortic valve replacement in March 2020 11 , LIMA to LAD, SVG to PDA, SVG to diagonal 1 , 21 mm pericardial aortic valve with prior Crestor intolerance here for reestablish visit.    creatinine 2.86 , hemoglobin 9.2    chest x-ray was performed on 11/16/15 which showed moderate CHF, Lasix 40 mg was started. Some of her shortness of breath could've been from anemia or coronary disease. She is back here today to discuss.   she definitely feels better after taking her Lasix, after 2 days, she felt back to baseline. Her baseline however is fairly wheelchair bound because of her knee osteoarthritis. No chest pain.   Feels emotional at times.  worried about her kidney function.   Past Medical History  Diagnosis Date  . CAD (coronary artery disease)   . DJD (degenerative joint disease)   . Osteopenia   . Hypertension   . Depression   . Hypothyroidism   . CFS (chronic fatigue syndrome)   . Chronic rhinitis   . Obesity   . Renal insufficiency     Post Op  . Hyperlipidemia   . Coronary atherosclerosis     Past Surgical History  Procedure Laterality Date  . Cardiac valve replacement      Outpatient Prescriptions Prior to Visit  Medication Sig Dispense Refill  . acetaminophen (TYLENOL) 650 MG CR tablet Take 650 mg by mouth every 8 (eight) hours as needed for pain.    Marland Kitchen amLODipine (NORVASC) 10 MG tablet Take 1 tablet (10 mg total) by mouth daily. 30 tablet 5  . aspirin 325 MG tablet Take 325 mg by mouth daily.    . cholecalciferol (VITAMIN D) 1000 units tablet Take 1,000 Units by mouth daily.    . fenofibrate 160 MG tablet Take 160 mg by mouth daily.    Marland Kitchen FLUoxetine (PROZAC) 10 MG  capsule Take 10 mg by mouth daily.    . furosemide (LASIX) 40 MG tablet Take 40 mg by mouth daily.    . Glucosamine-Chondroit-Vit C-Mn (GLUCOSAMINE CHONDR 1500 COMPLX PO) Take 2 tablets by mouth daily.     . Magnesium Hydroxide (MAGNESIA PO) Take 1 tablet by mouth at bedtime.     . cephALEXin (KEFLEX) 500 MG capsule Take 1 capsule (500 mg total) by mouth 4 (four) times daily. 28 capsule 0  . gabapentin (NEURONTIN) 300 MG capsule Take 300 mg by mouth 3 (three) times daily.    . hydrOXYzine (ATARAX/VISTARIL) 10 MG tablet Take 1 tablet (10 mg total) by mouth every 8 (eight) hours as needed for itching. 15 tablet 0  . levothyroxine (SYNTHROID, LEVOTHROID) 100 MCG tablet Take 100 mcg by mouth daily before breakfast.    . predniSONE (DELTASONE) 20 MG tablet 3 tabs po daily x 3 days, then 2 tabs x 3 days, then 1.5 tabs x 3 days, then 1 tab x 3 days, then 0.5 tabs x 3 days 27 tablet 0   No facility-administered medications prior to visit.     Allergies:   Review of patient's allergies indicates no known allergies.   Social History   Social History  . Marital Status:  Divorced    Spouse Name: N/A  . Number of Children: N/A  . Years of Education: N/A   Occupational History  . RETIRED NURSE    Social History Main Topics  . Smoking status: Never Smoker   . Smokeless tobacco: None  . Alcohol Use: No  . Drug Use: No  . Sexual Activity: Not Asked   Other Topics Concern  . None   Social History Narrative     Family History:  The patient's family history includes Alzheimer's disease in her mother; Aortic stenosis in her father.   ROS:   Please see the history of present illness.    ROS shortness of breath with activity, easy bruising, joint swelling, balance issues , leg swelling, appetite change. All other systems reviewed and are negative.   PHYSICAL EXAM:   VS:  BP 180/78 mmHg  Pulse 65  Ht 4\' 11"  (1.499 m)  Wt 156 lb 12.8 oz (71.124 kg)  BMI 31.65 kg/m2   GEN: Well nourished,  well developed, in no acute distress , sitting in wheelchair HEENT: normal Neck: no JVD, carotid bruits, or masses Cardiac: RRR; 2/6 singing systolic murmur, no rubs, or gallops,no edema  Respiratory:  clear to auscultation bilaterally, normal work of breathing GI: soft, nontender, nondistended, + BS MS: no deformity or atrophy Skin: warm and dry, no rash Neuro:  Alert and Oriented x 3, Strength and sensation are intact Psych: euthymic mood, full affect  Wt Readings from Last 3 Encounters:  12/20/15 156 lb 12.8 oz (71.124 kg)  12/09/15 165 lb (74.844 kg)      Studies/Labs Reviewed:   EKG:  EKG is ordered today.  The ekg ordered today demonstrates  Normal sinus rhythm, 65 , no other significant abnormalities,  Personally viewed  Recent Labs: No results found for requested labs within last 365 days.   Lipid Panel No results found for: CHOL, TRIG, HDL, CHOLHDL, VLDL, LDLCALC, LDLDIRECT  Additional studies/ records that were reviewed today include:   prior office notes, labs, EKG reviewed    ASSESSMENT:    1. Acute diastolic heart failure (HCC)   2. Obesity   3. Coronary artery disease due to lipid rich plaque   4. S/P AVR (aortic valve replacement)   5. Chronic kidney disease, stage 3      PLAN:  In order of problems listed above:  1. Acute diastolic heart failure- continue with Lasix 40 mg once a day. After 2 days of taking Lasix, her symptoms resolved. 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 Sabra Heck. He will be monitoring her creatinine. I do think that maintenance Lasix will be necessary for her. I will also check an echocardiogram since it is been a few years since last check. She does have a murmur , aortic valve replacement. 2.  Status post aortic valve replacement 3.  Coronary artery disease status post bypass surgery and aortic valve replacement - checking echocardiogram 4.  Obesity-encourage weight  loss. 5.  Knee osteoarthritis- does not wish to proceed with knee replacement. Difficult to be mobile at this point. 6.  Chronic kidney disease stage3-Dr.  Sabra Heck.    Medication Adjustments/Labs and Tests Ordered: Current medicines are reviewed at length with the patient today.  Concerns regarding medicines are outlined above.  Medication changes, Labs and Tests ordered today are listed in the Patient Instructions below. Patient Instructions  Medication Instructions:  The current medical regimen  is effective;  continue present plan and medications.  Testing/Procedures: Your physician has requested that you have an echocardiogram. Echocardiography is a painless test that uses sound waves to create images of your heart. It provides your doctor with information about the size and shape of your heart and how well your heart's chambers and valves are working. This procedure takes approximately one hour. There are no restrictions for this procedure.  Follow-Up: Follow up in 2 months with Dr Anne Fu.  If you need a refill on your cardiac medications before your next appointment, please call your pharmacy.  Thank you for choosing [facility]!!             Signed, Donato Schultz, MD  12/20/2015 9:59 AM    [facility] [address] [place], [place], [place]  [address] Phone: [phone]; Fax: [phone]

## 2015-12-20 NOTE — Patient Instructions (Signed)
Medication Instructions:  The current medical regimen is effective;  continue present plan and medications.  Testing/Procedures: Your physician has requested that you have an echocardiogram. Echocardiography is a painless test that uses sound waves to create images of your heart. It provides your doctor with information about the size and shape of your heart and how well your heart's chambers and valves are working. This procedure takes approximately one hour. There are no restrictions for this procedure.  Follow-Up: Follow up in 2 months with Dr Skains.  If you need a refill on your cardiac medications before your next appointment, please call your pharmacy.  Thank you for choosing [facility]!!     

## 2015-12-23 DIAGNOSIS — N184 Chronic kidney disease, stage 4 (severe): Secondary | ICD-10-CM | POA: Diagnosis not present

## 2015-12-23 DIAGNOSIS — D649 Anemia, unspecified: Secondary | ICD-10-CM | POA: Diagnosis not present

## 2015-12-27 DIAGNOSIS — N184 Chronic kidney disease, stage 4 (severe): Secondary | ICD-10-CM | POA: Diagnosis not present

## 2015-12-27 DIAGNOSIS — D649 Anemia, unspecified: Secondary | ICD-10-CM | POA: Diagnosis not present

## 2016-01-02 ENCOUNTER — Other Ambulatory Visit (HOSPITAL_COMMUNITY): Payer: Commercial Managed Care - HMO

## 2016-01-03 DIAGNOSIS — N184 Chronic kidney disease, stage 4 (severe): Secondary | ICD-10-CM | POA: Diagnosis not present

## 2016-01-03 DIAGNOSIS — I10 Essential (primary) hypertension: Secondary | ICD-10-CM | POA: Diagnosis not present

## 2016-01-03 DIAGNOSIS — R809 Proteinuria, unspecified: Secondary | ICD-10-CM | POA: Diagnosis not present

## 2016-01-03 DIAGNOSIS — D649 Anemia, unspecified: Secondary | ICD-10-CM | POA: Diagnosis not present

## 2016-01-15 ENCOUNTER — Other Ambulatory Visit: Payer: Self-pay

## 2016-01-15 ENCOUNTER — Ambulatory Visit (HOSPITAL_COMMUNITY): Payer: Commercial Managed Care - HMO | Attending: Cardiology

## 2016-01-15 DIAGNOSIS — I77810 Thoracic aortic ectasia: Secondary | ICD-10-CM | POA: Insufficient documentation

## 2016-01-15 DIAGNOSIS — E785 Hyperlipidemia, unspecified: Secondary | ICD-10-CM | POA: Diagnosis not present

## 2016-01-15 DIAGNOSIS — I110 Hypertensive heart disease with heart failure: Secondary | ICD-10-CM | POA: Diagnosis not present

## 2016-01-15 DIAGNOSIS — I5031 Acute diastolic (congestive) heart failure: Secondary | ICD-10-CM | POA: Insufficient documentation

## 2016-02-13 DIAGNOSIS — R262 Difficulty in walking, not elsewhere classified: Secondary | ICD-10-CM | POA: Diagnosis not present

## 2016-02-13 DIAGNOSIS — M25562 Pain in left knee: Secondary | ICD-10-CM | POA: Diagnosis not present

## 2016-02-13 DIAGNOSIS — M25561 Pain in right knee: Secondary | ICD-10-CM | POA: Diagnosis not present

## 2016-02-13 DIAGNOSIS — M170 Bilateral primary osteoarthritis of knee: Secondary | ICD-10-CM | POA: Diagnosis not present

## 2016-03-02 ENCOUNTER — Encounter: Payer: Self-pay | Admitting: Cardiology

## 2016-03-02 ENCOUNTER — Ambulatory Visit (INDEPENDENT_AMBULATORY_CARE_PROVIDER_SITE_OTHER): Payer: Commercial Managed Care - HMO | Admitting: Cardiology

## 2016-03-02 VITALS — BP 160/58 | HR 60 | Ht 60.0 in | Wt 171.0 lb

## 2016-03-02 DIAGNOSIS — Z952 Presence of prosthetic heart valve: Secondary | ICD-10-CM

## 2016-03-02 DIAGNOSIS — E669 Obesity, unspecified: Secondary | ICD-10-CM

## 2016-03-02 DIAGNOSIS — Z954 Presence of other heart-valve replacement: Secondary | ICD-10-CM

## 2016-03-02 DIAGNOSIS — R809 Proteinuria, unspecified: Secondary | ICD-10-CM | POA: Diagnosis not present

## 2016-03-02 DIAGNOSIS — N183 Chronic kidney disease, stage 3 unspecified: Secondary | ICD-10-CM

## 2016-03-02 DIAGNOSIS — I5032 Chronic diastolic (congestive) heart failure: Secondary | ICD-10-CM | POA: Diagnosis not present

## 2016-03-02 DIAGNOSIS — I2510 Atherosclerotic heart disease of native coronary artery without angina pectoris: Secondary | ICD-10-CM | POA: Diagnosis not present

## 2016-03-02 DIAGNOSIS — N184 Chronic kidney disease, stage 4 (severe): Secondary | ICD-10-CM | POA: Diagnosis not present

## 2016-03-02 DIAGNOSIS — I2583 Coronary atherosclerosis due to lipid rich plaque: Secondary | ICD-10-CM

## 2016-03-02 DIAGNOSIS — D649 Anemia, unspecified: Secondary | ICD-10-CM | POA: Diagnosis not present

## 2016-03-02 NOTE — Progress Notes (Signed)
Cardiology Office Note    Date:  03/02/2016   ID:  Jill Johnson, DOB 21-Oct-1941, MRN [id number]  PCP:  Hollice EspyGATES,DONNA RUTH, MD  Cardiologist:   Donato SchultzSKAINS, Kyrene Longan, MD     History of Present Illness:  Jill Johnson is a 75 y.o. female  With coronary artery disease status post aortic valve replacement in January 26, 2010 , LIMA to LAD, SVG to PDA, SVG to diagonal 1, 21 mm pericardial aortic valve with prior Crestor intolerance here for follow up visit.   Dr. Londell MohHouston  [facility]([place], [place]). Used to work for them. [place].   Creatinine  Previously 2.86 , hemoglobin 9.2   Chest x-ray was performed on 11/16/15 which showed moderate CHF, Lasix 40 mg was started. Some of her shortness of breath could've been from anemia or coronary disease. She is back here today to discuss.  She definitely feelt better after taking her Lasix, after 2 days, she felt back to baseline. Her baseline however is fairly wheelchair bound because of her knee osteoarthritis. No chest pain.  Feels emotional at times.  Worried about her kidney function.  If she takes 2 Lasix, she is very frequently having to change depends. Overall feels much better from a breathing perspective.  Past Medical History  Diagnosis Date  . CAD (coronary artery disease)   . DJD (degenerative joint disease)   . Osteopenia   . Hypertension   . Depression   . Hypothyroidism   . CFS (chronic fatigue syndrome)   . Chronic rhinitis   . Obesity   . Renal insufficiency     Post Op  . Hyperlipidemia   . Coronary atherosclerosis     Past Surgical History  Procedure Laterality Date  . Cardiac valve replacement      Outpatient Prescriptions Prior to Visit  Medication Sig Dispense Refill  . acetaminophen (TYLENOL) 650 MG CR tablet Take 650 mg by mouth every 8 (eight) hours as needed for pain.    Marland Kitchen. amLODipine (NORVASC) 10 MG tablet Take 1 tablet (10 mg total) by mouth daily. 30 tablet 5  . aspirin 325 MG tablet Take 325 mg  by mouth daily.    . cholecalciferol (VITAMIN D) 1000 units tablet Take 1,000 Units by mouth daily.    . fenofibrate 160 MG tablet Take 160 mg by mouth daily.    . ferrous sulfate 325 (65 FE) MG tablet Take 4 tablets by mout daiy  12  . FLUoxetine (PROZAC) 10 MG capsule Take 10 mg by mouth daily.    . furosemide (LASIX) 40 MG tablet Take 40 mg by mouth daily.    . Glucosamine-Chondroit-Vit C-Mn (GLUCOSAMINE CHONDR 1500 COMPLX PO) Take 2 tablets by mouth daily.     Marland Kitchen. levothyroxine (SYNTHROID, LEVOTHROID) 112 MCG tablet Take 112 mcg by mouth daily before breakfast.     . Magnesium Hydroxide (MAGNESIA PO) Take 1 tablet by mouth at bedtime.      No facility-administered medications prior to visit.     Allergies:   Prednisone   Social History   Social History  . Marital Status: Divorced    Spouse Name: N/A  . Number of Children: N/A  . Years of Education: N/A   Occupational History  . RETIRED NURSE    Social History Main Topics  . Smoking status: Never Smoker   . Smokeless tobacco: None  . Alcohol Use: No  . Drug Use: No  . Sexual Activity: Not Asked  Other Topics Concern  . None   Social History Narrative     Family History:  The patient's family history includes Alzheimer's disease in her mother; Aortic stenosis in her father.   ROS:   Please see the history of present illness.    ROS shortness of breath with activity, easy bruising, joint swelling, balance issues , leg swelling, appetite change. All other systems reviewed and are negative.   PHYSICAL EXAM:   VS:  BP 160/58 mmHg  Pulse 60  Ht 5' (1.524 m)  Wt 171 lb (77.565 kg)  BMI 33.40 kg/m2   GEN: Well nourished, well developed, in no acute distress , sitting in wheelchair HEENT: normal Neck: no JVD, carotid bruits, or masses Cardiac: RRR; 2/6 singing systolic murmur, no rubs, or gallops,no edema  Respiratory:  clear to auscultation bilaterally, normal work of breathing GI: soft, nontender, nondistended, +  BS MS: no deformity or atrophy Skin: warm and dry, no rash Neuro:  Alert and Oriented x 3, Strength and sensation are intact Psych: euthymic mood, full affect  Wt Readings from Last 3 Encounters:  03/02/16 171 lb (77.565 kg)  12/20/15 156 lb 12.8 oz (71.124 kg)  12/09/15 165 lb (74.844 kg)      Studies/Labs Reviewed:   EKG:  EKG is ordered today.  The ekg ordered today demonstrates  Normal sinus rhythm, 65 , no other significant abnormalities,  Personally viewed  Recent Labs: No results found for requested labs within last 365 days.   Lipid Panel No results found for: CHOL, TRIG, HDL, CHOLHDL, VLDL, LDLCALC, LDLDIRECT  Additional studies/ records that were reviewed today include:   prior office notes, labs, EKG reviewed  Echocardiogram 01/15/16: EF 55%, AV valve bioprosthetic functioning well.    ASSESSMENT:    1. Chronic diastolic heart failure (HCC)   2. Coronary artery disease due to lipid rich plaque   3. Chronic kidney disease, stage 3   4. Obesity   5. S/P AVR (aortic valve replacement)      PLAN:  In order of problems listed above:  Chronic diastolic heart failure- continue with Lasix 40 mg once a day. After 2 days of taking Lasix, her symptoms resolved. 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 Sabra Heck. He will be monitoring her creatinine. I do think that maintenance Lasix will be necessary for her.  She does have a murmur , aortic valve replacement.  Status post aortic valve replacement - dental prophylaxis.Murmur heard on exam. Echocardiogram reassuring.  Coronary artery disease status post bypass surgery and aortic valve replacement - echocardiogram reassuring  Obesity-encourage weight loss. Weight has fluctuated. She likes to eat mostly peanut butter on bread. Does not like meat. At a prior visit, she had not eaten in a few days she thinks.  Knee osteoarthritis- does not wish to proceed with  knee replacement. Difficult to be mobile at this point.Once again reminded her to avoid NSAIDs. She may take a Tylenol and she is every night.  Chronic kidney disease stage 3-Dr.  Sabra Heck. Avoid NSAIDs, going to lab core.    Medication Adjustments/Labs and Tests Ordered: Current medicines are reviewed at length with the patient today.  Concerns regarding medicines are outlined above.  Medication changes, Labs and Tests ordered today are listed in the Patient Instructions below. Patient Instructions  Medication Instructions:  The current medical regimen is effective;  continue present plan and medications.  Follow-Up: Follow  up in 6 months with Dr. Anne Fu.  You will receive a letter in the mail 2 months before you are due.  Please call us when you receive this letter to schedule your follow up appointment.  If you need a refill on your cardiac medications before your next appointment, please call your pharmacy.  Thank you for choosing [facility]!!             Signed, Donato Schultz, MD  03/02/2016 11:34 AM    [facility] [address] [place], [place], [place]  [address] Phone: [phone]; Fax: [phone]

## 2016-03-02 NOTE — Patient Instructions (Signed)

## 2016-03-13 DIAGNOSIS — N184 Chronic kidney disease, stage 4 (severe): Secondary | ICD-10-CM | POA: Diagnosis not present

## 2016-03-13 DIAGNOSIS — I10 Essential (primary) hypertension: Secondary | ICD-10-CM | POA: Diagnosis not present

## 2016-03-13 DIAGNOSIS — R809 Proteinuria, unspecified: Secondary | ICD-10-CM | POA: Diagnosis not present

## 2016-03-13 DIAGNOSIS — D649 Anemia, unspecified: Secondary | ICD-10-CM | POA: Diagnosis not present

## 2016-03-19 ENCOUNTER — Ambulatory Visit
Admission: RE | Admit: 2016-03-19 | Discharge: 2016-03-19 | Disposition: A | Discharge status: Home / Self Care | Payer: Commercial Managed Care - HMO | Source: Ambulatory Visit | Attending: Internal Medicine | Admitting: Internal Medicine

## 2016-03-19 ENCOUNTER — Other Ambulatory Visit: Payer: Self-pay | Admitting: Internal Medicine

## 2016-03-19 DIAGNOSIS — Z79899 Other long term (current) drug therapy: Secondary | ICD-10-CM | POA: Diagnosis not present

## 2016-03-19 DIAGNOSIS — R5383 Other fatigue: Secondary | ICD-10-CM | POA: Diagnosis not present

## 2016-03-19 DIAGNOSIS — R110 Nausea: Secondary | ICD-10-CM

## 2016-03-19 DIAGNOSIS — R634 Abnormal weight loss: Secondary | ICD-10-CM

## 2016-03-19 DIAGNOSIS — R630 Anorexia: Secondary | ICD-10-CM

## 2016-03-19 DIAGNOSIS — N184 Chronic kidney disease, stage 4 (severe): Secondary | ICD-10-CM | POA: Diagnosis not present

## 2016-03-19 DIAGNOSIS — R231 Pallor: Secondary | ICD-10-CM | POA: Diagnosis not present

## 2016-03-19 DIAGNOSIS — F329 Major depressive disorder, single episode, unspecified: Secondary | ICD-10-CM | POA: Diagnosis not present

## 2016-03-19 DIAGNOSIS — D649 Anemia, unspecified: Secondary | ICD-10-CM | POA: Diagnosis not present

## 2016-03-25 ENCOUNTER — Encounter: Payer: Self-pay | Admitting: Cardiology

## 2016-03-26 DIAGNOSIS — R5383 Other fatigue: Secondary | ICD-10-CM | POA: Diagnosis not present

## 2016-03-26 DIAGNOSIS — R110 Nausea: Secondary | ICD-10-CM | POA: Diagnosis not present

## 2016-03-26 DIAGNOSIS — R634 Abnormal weight loss: Secondary | ICD-10-CM | POA: Diagnosis not present

## 2016-03-26 DIAGNOSIS — Z79899 Other long term (current) drug therapy: Secondary | ICD-10-CM | POA: Diagnosis not present

## 2016-03-26 DIAGNOSIS — R630 Anorexia: Secondary | ICD-10-CM | POA: Diagnosis not present

## 2016-03-26 DIAGNOSIS — R231 Pallor: Secondary | ICD-10-CM | POA: Diagnosis not present

## 2016-03-26 DIAGNOSIS — D649 Anemia, unspecified: Secondary | ICD-10-CM | POA: Diagnosis not present

## 2016-03-26 DIAGNOSIS — N184 Chronic kidney disease, stage 4 (severe): Secondary | ICD-10-CM | POA: Diagnosis not present

## 2016-03-26 DIAGNOSIS — F329 Major depressive disorder, single episode, unspecified: Secondary | ICD-10-CM | POA: Diagnosis not present

## 2016-04-03 ENCOUNTER — Encounter (HOSPITAL_COMMUNITY)
Admission: RE | Admit: 2016-04-03 | Discharge: 2016-04-03 | Disposition: A | Discharge status: Home / Self Care | Payer: Commercial Managed Care - HMO | Source: Ambulatory Visit | Attending: Nephrology | Admitting: Nephrology

## 2016-04-03 ENCOUNTER — Encounter (HOSPITAL_COMMUNITY): Payer: Self-pay

## 2016-04-03 DIAGNOSIS — D638 Anemia in other chronic diseases classified elsewhere: Secondary | ICD-10-CM | POA: Insufficient documentation

## 2016-04-03 DIAGNOSIS — N183 Chronic kidney disease, stage 3 unspecified: Secondary | ICD-10-CM

## 2016-04-03 DIAGNOSIS — N184 Chronic kidney disease, stage 4 (severe): Secondary | ICD-10-CM | POA: Diagnosis not present

## 2016-04-03 LAB — RENAL FUNCTION PANEL
ALBUMIN: 3.6 g/dL (ref 3.5–5.0)
Anion gap: 6 (ref 5–15)
BUN: 61 mg/dL — AB (ref 6–20)
CALCIUM: 8.7 mg/dL — AB (ref 8.9–10.3)
CO2: 25 mmol/L (ref 22–32)
CREATININE: 3.5 mg/dL — AB (ref 0.44–1.00)
Chloride: 109 mmol/L (ref 101–111)
GFR calc Af Amer: 14 mL/min — ABNORMAL LOW (ref >60)
GFR calc non Af Amer: 12 mL/min — ABNORMAL LOW (ref >60)
GLUCOSE: 111 mg/dL — AB (ref 65–99)
PHOSPHORUS: 3.6 mg/dL (ref 2.5–4.6)
Potassium: 5.1 mmol/L (ref 3.5–5.1)
SODIUM: 140 mmol/L (ref 135–145)

## 2016-04-03 LAB — IRON AND TIBC
Iron: 90 ug/dL (ref 28–170)
SATURATION RATIOS: 24 % (ref 10.4–31.8)
TIBC: 382 ug/dL (ref 250–450)
UIBC: 292 ug/dL

## 2016-04-03 LAB — HEMOGLOBIN: Hemoglobin: 9.2 g/dL — ABNORMAL LOW (ref 12.0–15.0)

## 2016-04-03 LAB — FERRITIN: Ferritin: 322 ng/mL — ABNORMAL HIGH (ref 11–307)

## 2016-04-03 MED ORDER — DARBEPOETIN ALFA 60 MCG/0.3ML IJ SOSY
60.0000 ug | PREFILLED_SYRINGE | INTRAMUSCULAR | Status: DC
  Administered 2016-04-03: 60 ug via SUBCUTANEOUS
  Filled 2016-04-03: qty 0.3

## 2016-04-03 NOTE — Discharge Instructions (Signed)
Darbepoetin Alfa injection What is this medicine? DARBEPOETIN ALFA (dar be POE e tin AL fa) helps your body make more red blood cells. It is used to treat anemia caused by chronic kidney failure and chemotherapy. This medicine may be used for other purposes; ask your health care provider or pharmacist if you have questions. What should I tell my health care provider before I take this medicine? They need to know if you have any of these conditions: -blood clotting disorders or history of blood clots -cancer patient not on chemotherapy -cystic fibrosis -heart disease, such as angina, heart failure, or a history of a heart attack -hemoglobin level of 12 g/dL or greater -high blood pressure -low levels of folate, iron, or vitamin B12 -seizures -an unusual or allergic reaction to darbepoetin, erythropoietin, albumin, hamster proteins, latex, other medicines, foods, dyes, or preservatives -pregnant or trying to get pregnant -breast-feeding How should I use this medicine? This medicine is for injection into a vein or under the skin. It is usually given by a health care professional in a hospital or clinic setting. If you get this medicine at home, you will be taught how to prepare and give this medicine. Do not shake the solution before you withdraw a dose. Use exactly as directed. Take your medicine at regular intervals. Do not take your medicine more often than directed. It is important that you put your used needles and syringes in a special sharps container. Do not put them in a trash can. If you do not have a sharps container, call your pharmacist or healthcare provider to get one. Talk to your pediatrician regarding the use of this medicine in children. While this medicine may be used in children as young as 1 year for selected conditions, precautions do apply. Overdosage: If you think you have taken too much of this medicine contact a poison control center or emergency room at once. NOTE:  This medicine is only for you. Do not share this medicine with others. What if I miss a dose? If you miss a dose, take it as soon as you can. If it is almost time for your next dose, take only that dose. Do not take double or extra doses. What may interact with this medicine? Do not take this medicine with any of the following medications: -epoetin alfa This list may not describe all possible interactions. Give your health care provider a list of all the medicines, herbs, non-prescription drugs, or dietary supplements you use. Also tell them if you smoke, drink alcohol, or use illegal drugs. Some items may interact with your medicine. What should I watch for while using this medicine? Visit your prescriber or health care professional for regular checks on your progress and for the needed blood tests and blood pressure measurements. It is especially important for the doctor to make sure your hemoglobin level is in the desired range, to limit the risk of potential side effects and to give you the best benefit. Keep all appointments for any recommended tests. Check your blood pressure as directed. Ask your doctor what your blood pressure should be and when you should contact him or her. As your body makes more red blood cells, you may need to take iron, folic acid, or vitamin B supplements. Ask your doctor or health care provider which products are right for you. If you have kidney disease continue dietary restrictions, even though this medication can make you feel better. Talk with your doctor or health care professional about the   foods you eat and the vitamins that you take. What side effects may I notice from receiving this medicine? Side effects that you should report to your doctor or health care professional as soon as possible: -allergic reactions like skin rash, itching or hives, swelling of the face, lips, or tongue -breathing problems -changes in vision -chest pain -confusion, trouble speaking  or understanding -feeling faint or lightheaded, falls -high blood pressure -muscle aches or pains -pain, swelling, warmth in the leg -rapid weight gain -severe headaches -sudden numbness or weakness of the face, arm or leg -trouble walking, dizziness, loss of balance or coordination -seizures (convulsions) -swelling of the ankles, feet, hands -unusually weak or tired Side effects that usually do not require medical attention (report to your doctor or health care professional if they continue or are bothersome): -diarrhea -fever, chills (flu-like symptoms) -headaches -nausea, vomiting -redness, stinging, or swelling at site where injected This list may not describe all possible side effects. Call your doctor for medical advice about side effects. You may report side effects to FDA at 1-800-FDA-1088. Where should I keep my medicine? Keep out of the reach of children. Store in a refrigerator between 2 and 8 degrees C (36 and 46 degrees F). Do not freeze. Do not shake. Throw away any unused portion if using a single-dose vial. Throw away any unused medicine after the expiration date. NOTE: This sheet is a summary. It may not cover all possible information. If you have questions about this medicine, talk to your doctor, pharmacist, or health care provider.    2016, Elsevier/Gold Standard. (2008-10-09 10:23:57)  

## 2016-04-04 LAB — PTH, INTACT AND CALCIUM
Calcium, Total (PTH): 8.9 mg/dL (ref 8.7–10.3)
PTH: 89 pg/mL — ABNORMAL HIGH (ref 15–65)

## 2016-04-15 DIAGNOSIS — M25561 Pain in right knee: Secondary | ICD-10-CM | POA: Diagnosis not present

## 2016-04-15 DIAGNOSIS — M170 Bilateral primary osteoarthritis of knee: Secondary | ICD-10-CM | POA: Diagnosis not present

## 2016-04-15 DIAGNOSIS — M25562 Pain in left knee: Secondary | ICD-10-CM | POA: Diagnosis not present

## 2016-04-21 DIAGNOSIS — M1711 Unilateral primary osteoarthritis, right knee: Secondary | ICD-10-CM | POA: Diagnosis not present

## 2016-04-21 DIAGNOSIS — M25561 Pain in right knee: Secondary | ICD-10-CM | POA: Diagnosis not present

## 2016-04-23 DIAGNOSIS — M25562 Pain in left knee: Secondary | ICD-10-CM | POA: Diagnosis not present

## 2016-04-23 DIAGNOSIS — M1712 Unilateral primary osteoarthritis, left knee: Secondary | ICD-10-CM | POA: Diagnosis not present

## 2016-04-27 DIAGNOSIS — M1711 Unilateral primary osteoarthritis, right knee: Secondary | ICD-10-CM | POA: Diagnosis not present

## 2016-04-27 DIAGNOSIS — M25561 Pain in right knee: Secondary | ICD-10-CM | POA: Diagnosis not present

## 2016-04-29 DIAGNOSIS — M25562 Pain in left knee: Secondary | ICD-10-CM | POA: Diagnosis not present

## 2016-04-29 DIAGNOSIS — M1712 Unilateral primary osteoarthritis, left knee: Secondary | ICD-10-CM | POA: Diagnosis not present

## 2016-05-01 ENCOUNTER — Encounter (HOSPITAL_COMMUNITY): Payer: Self-pay

## 2016-05-01 ENCOUNTER — Encounter (HOSPITAL_COMMUNITY)
Admission: RE | Admit: 2016-05-01 | Discharge: 2016-05-01 | Disposition: A | Discharge status: Home / Self Care | Payer: Commercial Managed Care - HMO | Source: Ambulatory Visit | Attending: Nephrology | Admitting: Nephrology

## 2016-05-01 DIAGNOSIS — D638 Anemia in other chronic diseases classified elsewhere: Secondary | ICD-10-CM | POA: Diagnosis not present

## 2016-05-01 DIAGNOSIS — N183 Chronic kidney disease, stage 3 unspecified: Secondary | ICD-10-CM

## 2016-05-01 DIAGNOSIS — N184 Chronic kidney disease, stage 4 (severe): Secondary | ICD-10-CM | POA: Diagnosis not present

## 2016-05-01 LAB — RENAL FUNCTION PANEL
ALBUMIN: 3.2 g/dL — AB (ref 3.5–5.0)
Anion gap: 7 (ref 5–15)
BUN: 66 mg/dL — AB (ref 6–20)
CHLORIDE: 109 mmol/L (ref 101–111)
CO2: 23 mmol/L (ref 22–32)
CREATININE: 3.56 mg/dL — AB (ref 0.44–1.00)
Calcium: 8.6 mg/dL — ABNORMAL LOW (ref 8.9–10.3)
GFR calc Af Amer: 13 mL/min — ABNORMAL LOW (ref >60)
GFR, EST NON AFRICAN AMERICAN: 11 mL/min — AB (ref >60)
GLUCOSE: 105 mg/dL — AB (ref 65–99)
Phosphorus: 4.2 mg/dL (ref 2.5–4.6)
Potassium: 6 mmol/L — ABNORMAL HIGH (ref 3.5–5.1)
Sodium: 139 mmol/L (ref 135–145)

## 2016-05-01 LAB — IRON AND TIBC
Iron: 78 ug/dL (ref 28–170)
Saturation Ratios: 20 % (ref 10.4–31.8)
TIBC: 386 ug/dL (ref 250–450)
UIBC: 308 ug/dL

## 2016-05-01 LAB — HEMOGLOBIN: Hemoglobin: 8.9 g/dL — ABNORMAL LOW (ref 12.0–15.0)

## 2016-05-01 LAB — FERRITIN: Ferritin: 237 ng/mL (ref 11–307)

## 2016-05-01 MED ORDER — DARBEPOETIN ALFA 60 MCG/0.3ML IJ SOSY
60.0000 ug | PREFILLED_SYRINGE | INTRAMUSCULAR | Status: DC
  Administered 2016-05-01: 60 ug via SUBCUTANEOUS
  Filled 2016-05-01: qty 0.3

## 2016-05-04 DIAGNOSIS — M25561 Pain in right knee: Secondary | ICD-10-CM | POA: Diagnosis not present

## 2016-05-04 DIAGNOSIS — M1711 Unilateral primary osteoarthritis, right knee: Secondary | ICD-10-CM | POA: Diagnosis not present

## 2016-05-07 DIAGNOSIS — M1712 Unilateral primary osteoarthritis, left knee: Secondary | ICD-10-CM | POA: Diagnosis not present

## 2016-05-07 DIAGNOSIS — M25562 Pain in left knee: Secondary | ICD-10-CM | POA: Diagnosis not present

## 2016-05-13 DIAGNOSIS — M25561 Pain in right knee: Secondary | ICD-10-CM | POA: Diagnosis not present

## 2016-05-13 DIAGNOSIS — M170 Bilateral primary osteoarthritis of knee: Secondary | ICD-10-CM | POA: Diagnosis not present

## 2016-05-13 DIAGNOSIS — M25562 Pain in left knee: Secondary | ICD-10-CM | POA: Diagnosis not present

## 2016-05-14 DIAGNOSIS — E669 Obesity, unspecified: Secondary | ICD-10-CM | POA: Diagnosis not present

## 2016-05-14 DIAGNOSIS — N184 Chronic kidney disease, stage 4 (severe): Secondary | ICD-10-CM | POA: Diagnosis not present

## 2016-05-14 DIAGNOSIS — D649 Anemia, unspecified: Secondary | ICD-10-CM | POA: Diagnosis not present

## 2016-05-14 DIAGNOSIS — E875 Hyperkalemia: Secondary | ICD-10-CM | POA: Diagnosis not present

## 2016-05-14 DIAGNOSIS — R809 Proteinuria, unspecified: Secondary | ICD-10-CM | POA: Diagnosis not present

## 2016-05-14 DIAGNOSIS — I10 Essential (primary) hypertension: Secondary | ICD-10-CM | POA: Diagnosis not present

## 2016-05-14 DIAGNOSIS — E663 Overweight: Secondary | ICD-10-CM | POA: Diagnosis not present

## 2016-05-28 ENCOUNTER — Other Ambulatory Visit: Payer: Self-pay | Admitting: Vascular Surgery

## 2016-05-29 DIAGNOSIS — D649 Anemia, unspecified: Secondary | ICD-10-CM | POA: Diagnosis not present

## 2016-05-29 DIAGNOSIS — R630 Anorexia: Secondary | ICD-10-CM | POA: Diagnosis not present

## 2016-05-29 DIAGNOSIS — R110 Nausea: Secondary | ICD-10-CM | POA: Diagnosis not present

## 2016-05-29 DIAGNOSIS — Z1389 Encounter for screening for other disorder: Secondary | ICD-10-CM | POA: Diagnosis not present

## 2016-05-29 DIAGNOSIS — Z79899 Other long term (current) drug therapy: Secondary | ICD-10-CM | POA: Diagnosis not present

## 2016-05-29 DIAGNOSIS — Z7189 Other specified counseling: Secondary | ICD-10-CM | POA: Diagnosis not present

## 2016-05-29 DIAGNOSIS — Z0000 Encounter for general adult medical examination without abnormal findings: Secondary | ICD-10-CM | POA: Diagnosis not present

## 2016-05-29 DIAGNOSIS — R231 Pallor: Secondary | ICD-10-CM | POA: Diagnosis not present

## 2016-05-29 DIAGNOSIS — F419 Anxiety disorder, unspecified: Secondary | ICD-10-CM | POA: Diagnosis not present

## 2016-05-29 DIAGNOSIS — Z23 Encounter for immunization: Secondary | ICD-10-CM | POA: Diagnosis not present

## 2016-05-29 DIAGNOSIS — Z6832 Body mass index (BMI) 32.0-32.9, adult: Secondary | ICD-10-CM | POA: Diagnosis not present

## 2016-05-29 DIAGNOSIS — R5383 Other fatigue: Secondary | ICD-10-CM | POA: Diagnosis not present

## 2016-05-29 DIAGNOSIS — R634 Abnormal weight loss: Secondary | ICD-10-CM | POA: Diagnosis not present

## 2016-05-29 DIAGNOSIS — N184 Chronic kidney disease, stage 4 (severe): Secondary | ICD-10-CM | POA: Diagnosis not present

## 2016-06-01 ENCOUNTER — Encounter (HOSPITAL_COMMUNITY)
Admission: RE | Admit: 2016-06-01 | Discharge: 2016-06-01 | Disposition: A | Discharge status: Home / Self Care | Payer: Commercial Managed Care - HMO | Source: Ambulatory Visit | Attending: Nephrology | Admitting: Nephrology

## 2016-06-01 ENCOUNTER — Encounter (HOSPITAL_COMMUNITY): Payer: Self-pay

## 2016-06-01 ENCOUNTER — Other Ambulatory Visit: Payer: Self-pay | Admitting: Vascular Surgery

## 2016-06-01 DIAGNOSIS — D638 Anemia in other chronic diseases classified elsewhere: Secondary | ICD-10-CM | POA: Diagnosis not present

## 2016-06-01 DIAGNOSIS — N183 Chronic kidney disease, stage 3 unspecified: Secondary | ICD-10-CM

## 2016-06-01 DIAGNOSIS — Z01810 Encounter for preprocedural cardiovascular examination: Secondary | ICD-10-CM

## 2016-06-01 DIAGNOSIS — N184 Chronic kidney disease, stage 4 (severe): Secondary | ICD-10-CM | POA: Insufficient documentation

## 2016-06-01 LAB — IRON AND TIBC
Iron: 65 ug/dL (ref 28–170)
Saturation Ratios: 17 % (ref 10.4–31.8)
TIBC: 377 ug/dL (ref 250–450)
UIBC: 312 ug/dL

## 2016-06-01 LAB — FERRITIN: Ferritin: 177 ng/mL (ref 11–307)

## 2016-06-01 LAB — RENAL FUNCTION PANEL
ALBUMIN: 3.5 g/dL (ref 3.5–5.0)
ANION GAP: 7 (ref 5–15)
BUN: 75 mg/dL — ABNORMAL HIGH (ref 6–20)
CALCIUM: 8.5 mg/dL — AB (ref 8.9–10.3)
CO2: 26 mmol/L (ref 22–32)
Chloride: 105 mmol/L (ref 101–111)
Creatinine, Ser: 4.19 mg/dL — ABNORMAL HIGH (ref 0.44–1.00)
GFR, EST AFRICAN AMERICAN: 11 mL/min — AB (ref >60)
GFR, EST NON AFRICAN AMERICAN: 9 mL/min — AB (ref >60)
GLUCOSE: 110 mg/dL — AB (ref 65–99)
PHOSPHORUS: 4.4 mg/dL (ref 2.5–4.6)
POTASSIUM: 4.9 mmol/L (ref 3.5–5.1)
SODIUM: 138 mmol/L (ref 135–145)

## 2016-06-01 LAB — HEMOGLOBIN: HEMOGLOBIN: 8.6 g/dL — AB (ref 12.0–15.0)

## 2016-06-01 MED ORDER — DARBEPOETIN ALFA 200 MCG/0.4ML IJ SOSY
150.0000 ug | PREFILLED_SYRINGE | INTRAMUSCULAR | Status: DC
  Administered 2016-06-01: 150 ug via SUBCUTANEOUS
  Filled 2016-06-01 (×2): qty 0.4

## 2016-06-11 ENCOUNTER — Encounter: Payer: Self-pay | Admitting: Vascular Surgery

## 2016-06-17 ENCOUNTER — Ambulatory Visit (INDEPENDENT_AMBULATORY_CARE_PROVIDER_SITE_OTHER): Payer: Commercial Managed Care - HMO | Admitting: Vascular Surgery

## 2016-06-17 ENCOUNTER — Ambulatory Visit (HOSPITAL_COMMUNITY)
Admission: RE | Admit: 2016-06-17 | Discharge: 2016-06-17 | Disposition: A | Discharge status: Home / Self Care | Payer: Commercial Managed Care - HMO | Source: Ambulatory Visit | Attending: Vascular Surgery | Admitting: Vascular Surgery

## 2016-06-17 ENCOUNTER — Ambulatory Visit (INDEPENDENT_AMBULATORY_CARE_PROVIDER_SITE_OTHER)
Admission: RE | Admit: 2016-06-17 | Discharge: 2016-06-17 | Disposition: A | Discharge status: Home / Self Care | Payer: Commercial Managed Care - HMO | Source: Ambulatory Visit | Attending: Vascular Surgery | Admitting: Vascular Surgery

## 2016-06-17 ENCOUNTER — Encounter: Payer: Self-pay | Admitting: Vascular Surgery

## 2016-06-17 VITALS — BP 185/68 | HR 59 | Ht 60.0 in | Wt 164.0 lb

## 2016-06-17 DIAGNOSIS — E785 Hyperlipidemia, unspecified: Secondary | ICD-10-CM | POA: Diagnosis not present

## 2016-06-17 DIAGNOSIS — N184 Chronic kidney disease, stage 4 (severe): Secondary | ICD-10-CM | POA: Diagnosis not present

## 2016-06-17 DIAGNOSIS — I2510 Atherosclerotic heart disease of native coronary artery without angina pectoris: Secondary | ICD-10-CM | POA: Insufficient documentation

## 2016-06-17 DIAGNOSIS — I129 Hypertensive chronic kidney disease with stage 1 through stage 4 chronic kidney disease, or unspecified chronic kidney disease: Secondary | ICD-10-CM | POA: Diagnosis not present

## 2016-06-17 DIAGNOSIS — F329 Major depressive disorder, single episode, unspecified: Secondary | ICD-10-CM | POA: Insufficient documentation

## 2016-06-17 DIAGNOSIS — Z01810 Encounter for preprocedural cardiovascular examination: Secondary | ICD-10-CM

## 2016-06-17 DIAGNOSIS — E039 Hypothyroidism, unspecified: Secondary | ICD-10-CM | POA: Diagnosis not present

## 2016-06-17 NOTE — Progress Notes (Signed)
[facility] [place]  Patient name: Jill Johnson MRN: [id number] DOB: [birth year] Sex: female  REASON FOR CONSULT: Discuss hemodialysis access  HPI: Jill Johnson is a 75 y.o. female, who is in today for discussion of hemodialysis access. She has had progressive renal insufficiency isn't approaching need for hemodialysis access. She is quite upset regarding this possibility and has not been willing to consider dialysis to this point. She has had no prior access for hemodialysis. His status post coronary artery bypass grafting in number of years ago. She is a retired Engineer, civil (consulting).  Past Medical History:  Diagnosis Date  . Anemia   . CAD (coronary artery disease)   . CFS (chronic fatigue syndrome)   . Chronic rhinitis   . Coronary atherosclerosis   . Depression   . DJD (degenerative joint disease)   . Hyperlipidemia   . Hypertension   . Hypothyroidism   . MDD (major depressive disorder) (HCC)   . Obesity   . Osteopenia   . Renal insufficiency    Post Op    Family History  Problem Relation Age of Onset  . Alzheimer's disease Mother   . Aortic stenosis Father   . Heart disease Father   . Heart disease Son     SOCIAL HISTORY: Social History   Social History  . Marital status: Divorced    Spouse name: N/A  . Number of children: N/A  . Years of education: N/A   Occupational History  . RETIRED NURSE    Social History Main Topics  . Smoking status: Never Smoker  . Smokeless tobacco: Never Used  . Alcohol use No  . Drug use: No  . Sexual activity: Not on file   Other Topics Concern  . Not on file   Social History Narrative  . No narrative on file    Allergies  Allergen Reactions  . Prednisone     Makes her loony    Current Outpatient Prescriptions  Medication Sig Dispense Refill  . acetaminophen (TYLENOL) 650 MG CR tablet Take 650 mg by mouth every 8 (eight) hours as needed for pain.    Marland Kitchen amLODipine (NORVASC) 10 MG tablet Take 1 tablet  (10 mg total) by mouth daily. 30 tablet 5  . aspirin 325 MG tablet Take 325 mg by mouth daily.    . cholecalciferol (VITAMIN D) 1000 units tablet Take 1,000 Units by mouth daily.    . citalopram (CELEXA) 20 MG tablet Take 20 mg by mouth daily.    . fenofibrate 160 MG tablet Take 160 mg by mouth daily.    . ferrous sulfate 325 (65 FE) MG tablet Take 4 tablets by mout daiy  12  . furosemide (LASIX) 40 MG tablet Take 40 mg by mouth daily.    Marland Kitchen gabapentin (NEURONTIN) 300 MG capsule     . Glucosamine-Chondroit-Vit C-Mn (GLUCOSAMINE CHONDR 1500 COMPLX PO) Take 2 tablets by mouth daily.     Marland Kitchen levothyroxine (SYNTHROID, LEVOTHROID) 112 MCG tablet Take 112 mcg by mouth daily before breakfast.     . losartan (COZAAR) 25 MG tablet Take 25 mg by mouth daily.     . Magnesium Hydroxide (MAGNESIA PO) Take 1 tablet by mouth at bedtime.     . pantoprazole (PROTONIX) 40 MG tablet TK 1 T PO QD  3  . FLUoxetine (PROZAC) 10 MG capsule Take 10 mg by mouth daily.     No current facility-administered medications for this visit.  REVIEW OF SYSTEMS:  [X]  denotes positive finding, [ ]  denotes negative finding Cardiac  Comments:  Chest pain or chest pressure:    Shortness of breath upon exertion: x   Short of breath when lying flat:    Irregular heart rhythm:        Vascular    Pain in calf, thigh, or hip brought on by ambulation:    Pain in feet at night that wakes you up from your sleep:     Blood clot in your veins:    Leg swelling:  x       Pulmonary    Oxygen at home:    Productive cough:     Wheezing:         Neurologic    Sudden weakness in arms or legs:     Sudden numbness in arms or legs:     Sudden onset of difficulty speaking or slurred speech:    Temporary loss of vision in one eye:     Problems with dizziness:         Gastrointestinal    Blood in stool:     Vomited blood:         Genitourinary    Burning when urinating:     Blood in urine:        Psychiatric    Major  depression:  x       Hematologic    Bleeding problems:    Problems with blood clotting too easily:        Skin    Rashes or ulcers:        Constitutional    Fever or chills:      PHYSICAL EXAM: Vitals:   06/17/16 1304 06/17/16 1307 06/17/16 1308  BP: (!) 192/68 (!) 190/64 (!) 185/68  Pulse: (!) 59    SpO2: 97%    Weight: 164 lb (74.4 kg)    Height: 5' (1.524 m)      GENERAL: The patient is a well-nourished female, in no acute distress. The vital signs are documented above. VASCULAR: 2+ radial and brachial pulses bilaterally PULMONARY: There is good air exchange MUSCULOSKELETAL: There are no major deformities or cyanosis. NEUROLOGIC: No focal weakness or paresthesias are detected. SKIN: There are no ulcers or rashes noted. PSYCHIATRIC: The patient has a normal affect.  DATA:  Upper extremity arterial studies are normal bilaterally with normal pressures and waveforms. Left cephalic vein seems moderate sized in the forearm and nicely sized in the left upper arm. Seligman vein is patent and the left as well. Right cephalic vein is of good caliber from the antecubital space proximally.  MEDICAL ISSUES: Discussion with patient regarding options for hemodialysis. I discussed the temporary catheter, AV fistula and AV graft and also the limitations of all of these. She does appear to have adequate vein size for attempted fistula on the left. Have recommended left arm AV fistula placement. Explain the possibility of non-maturation and occlusion. She is not willing to consent to access placement currently and wished to continue to discuss this with Dr. Marisue HumbleSanford. We will be available as needed for left arm AV fistula creation.   [name], [name] [facility] [place] [facility] [phone]

## 2016-06-29 ENCOUNTER — Encounter (HOSPITAL_COMMUNITY): Payer: Self-pay

## 2016-06-29 ENCOUNTER — Encounter (HOSPITAL_COMMUNITY)
Admission: RE | Admit: 2016-06-29 | Discharge: 2016-06-29 | Disposition: A | Discharge status: Home / Self Care | Payer: Commercial Managed Care - HMO | Source: Ambulatory Visit | Attending: Nephrology | Admitting: Nephrology

## 2016-06-29 DIAGNOSIS — N184 Chronic kidney disease, stage 4 (severe): Secondary | ICD-10-CM | POA: Insufficient documentation

## 2016-06-29 DIAGNOSIS — D638 Anemia in other chronic diseases classified elsewhere: Secondary | ICD-10-CM | POA: Insufficient documentation

## 2016-06-29 DIAGNOSIS — N183 Chronic kidney disease, stage 3 unspecified: Secondary | ICD-10-CM

## 2016-06-29 LAB — FERRITIN: Ferritin: 131 ng/mL (ref 11–307)

## 2016-06-29 LAB — RENAL FUNCTION PANEL
ALBUMIN: 3.6 g/dL (ref 3.5–5.0)
ANION GAP: 5 (ref 5–15)
BUN: 80 mg/dL — ABNORMAL HIGH (ref 6–20)
CALCIUM: 8.7 mg/dL — AB (ref 8.9–10.3)
CO2: 24 mmol/L (ref 22–32)
Chloride: 108 mmol/L (ref 101–111)
Creatinine, Ser: 3.88 mg/dL — ABNORMAL HIGH (ref 0.44–1.00)
GFR, EST AFRICAN AMERICAN: 12 mL/min — AB (ref >60)
GFR, EST NON AFRICAN AMERICAN: 10 mL/min — AB (ref >60)
Glucose, Bld: 104 mg/dL — ABNORMAL HIGH (ref 65–99)
PHOSPHORUS: 3.9 mg/dL (ref 2.5–4.6)
POTASSIUM: 5.4 mmol/L — AB (ref 3.5–5.1)
Sodium: 137 mmol/L (ref 135–145)

## 2016-06-29 LAB — IRON AND TIBC
Iron: 82 ug/dL (ref 28–170)
Saturation Ratios: 21 % (ref 10.4–31.8)
TIBC: 388 ug/dL (ref 250–450)
UIBC: 306 ug/dL

## 2016-06-29 LAB — HEMOGLOBIN: HEMOGLOBIN: 8.9 g/dL — AB (ref 12.0–15.0)

## 2016-06-29 MED ORDER — SODIUM CHLORIDE 0.9 % IV SOLN
510.0000 mg | INTRAVENOUS | Status: DC
  Administered 2016-06-29: 510 mg via INTRAVENOUS
  Filled 2016-06-29: qty 17

## 2016-06-29 MED ORDER — DARBEPOETIN ALFA 200 MCG/0.4ML IJ SOSY
150.0000 ug | PREFILLED_SYRINGE | INTRAMUSCULAR | Status: DC
  Administered 2016-06-29: 150 ug via SUBCUTANEOUS
  Filled 2016-06-29: qty 0.4

## 2016-06-29 MED ORDER — SODIUM CHLORIDE 0.9 % IV SOLN
INTRAVENOUS | Status: DC
  Administered 2016-06-29: 11:00:00 via INTRAVENOUS

## 2016-06-29 NOTE — Discharge Instructions (Signed)

## 2016-06-30 DIAGNOSIS — R809 Proteinuria, unspecified: Secondary | ICD-10-CM | POA: Diagnosis not present

## 2016-06-30 DIAGNOSIS — N185 Chronic kidney disease, stage 5: Secondary | ICD-10-CM | POA: Diagnosis not present

## 2016-06-30 DIAGNOSIS — D649 Anemia, unspecified: Secondary | ICD-10-CM | POA: Diagnosis not present

## 2016-06-30 DIAGNOSIS — I10 Essential (primary) hypertension: Secondary | ICD-10-CM | POA: Diagnosis not present

## 2016-06-30 DIAGNOSIS — N2581 Secondary hyperparathyroidism of renal origin: Secondary | ICD-10-CM | POA: Diagnosis not present

## 2016-06-30 LAB — PTH, INTACT AND CALCIUM
CALCIUM TOTAL (PTH): 8.5 mg/dL — AB (ref 8.7–10.3)
PTH: 91 pg/mL — ABNORMAL HIGH (ref 15–65)

## 2016-07-08 DIAGNOSIS — N184 Chronic kidney disease, stage 4 (severe): Secondary | ICD-10-CM | POA: Diagnosis not present

## 2016-07-08 DIAGNOSIS — M62830 Muscle spasm of back: Secondary | ICD-10-CM | POA: Diagnosis not present

## 2016-07-27 ENCOUNTER — Encounter (HOSPITAL_COMMUNITY): Payer: Self-pay

## 2016-07-27 ENCOUNTER — Encounter (HOSPITAL_COMMUNITY)
Admission: RE | Admit: 2016-07-27 | Discharge: 2016-07-27 | Disposition: A | Discharge status: Home / Self Care | Payer: Commercial Managed Care - HMO | Source: Ambulatory Visit | Attending: Nephrology | Admitting: Nephrology

## 2016-07-27 DIAGNOSIS — D638 Anemia in other chronic diseases classified elsewhere: Secondary | ICD-10-CM | POA: Diagnosis not present

## 2016-07-27 DIAGNOSIS — N184 Chronic kidney disease, stage 4 (severe): Secondary | ICD-10-CM | POA: Diagnosis not present

## 2016-07-27 DIAGNOSIS — N183 Chronic kidney disease, stage 3 unspecified: Secondary | ICD-10-CM

## 2016-07-27 LAB — RENAL FUNCTION PANEL
ALBUMIN: 3.4 g/dL — AB (ref 3.5–5.0)
Anion gap: 8 (ref 5–15)
BUN: 79 mg/dL — AB (ref 6–20)
CHLORIDE: 105 mmol/L (ref 101–111)
CO2: 23 mmol/L (ref 22–32)
Calcium: 8.5 mg/dL — ABNORMAL LOW (ref 8.9–10.3)
Creatinine, Ser: 3.65 mg/dL — ABNORMAL HIGH (ref 0.44–1.00)
GFR calc Af Amer: 13 mL/min — ABNORMAL LOW (ref >60)
GFR calc non Af Amer: 11 mL/min — ABNORMAL LOW (ref >60)
GLUCOSE: 111 mg/dL — AB (ref 65–99)
PHOSPHORUS: 3.7 mg/dL (ref 2.5–4.6)
POTASSIUM: 4.8 mmol/L (ref 3.5–5.1)
Sodium: 136 mmol/L (ref 135–145)

## 2016-07-27 LAB — IRON AND TIBC
Iron: 75 ug/dL (ref 28–170)
Saturation Ratios: 23 % (ref 10.4–31.8)
TIBC: 323 ug/dL (ref 250–450)
UIBC: 248 ug/dL

## 2016-07-27 LAB — FERRITIN: Ferritin: 355 ng/mL — ABNORMAL HIGH (ref 11–307)

## 2016-07-27 LAB — HEMOGLOBIN: HEMOGLOBIN: 9.2 g/dL — AB (ref 12.0–15.0)

## 2016-07-27 MED ORDER — DARBEPOETIN ALFA 200 MCG/0.4ML IJ SOSY
150.0000 ug | PREFILLED_SYRINGE | INTRAMUSCULAR | Status: DC
  Administered 2016-07-27: 150 ug via SUBCUTANEOUS
  Filled 2016-07-27: qty 0.4

## 2016-07-27 MED ORDER — SODIUM CHLORIDE 0.9 % IV SOLN
510.0000 mg | INTRAVENOUS | Status: AC
  Administered 2016-07-27: 510 mg via INTRAVENOUS
  Filled 2016-07-27: qty 17

## 2016-07-27 MED ORDER — CLONIDINE HCL 0.1 MG PO TABS
0.1000 mg | ORAL_TABLET | Freq: Once | ORAL | Status: AC | PRN
  Administered 2016-07-27: 0.1 mg via ORAL
  Filled 2016-07-27: qty 1

## 2016-07-27 MED ORDER — SODIUM CHLORIDE 0.9 % IV SOLN
INTRAVENOUS | Status: AC
  Administered 2016-07-27: 12:00:00 via INTRAVENOUS

## 2016-07-27 NOTE — Progress Notes (Signed)
Hypertension improved after clonidine.  Pt given injection Aranesp and Ferahem infusion without problems.  Encouraged Pt per Dr Marisue HumbleSanford to followup with medical MD regarding right leg. Pt has difficulty putting weight on right leg when ambulating with assistance to bathroom.

## 2016-07-27 NOTE — Progress Notes (Signed)
Pt fell this past Sat night and has right low leg swelling and redness, right knee bruised and has a small blister. BP elevated.  Previous findings reported to Dr Marisue HumbleSanford and he gives permission to proceed with infusion if BP improves.

## 2016-07-29 DIAGNOSIS — S8011XA Contusion of right lower leg, initial encounter: Secondary | ICD-10-CM | POA: Diagnosis not present

## 2016-07-29 DIAGNOSIS — L03115 Cellulitis of right lower limb: Secondary | ICD-10-CM | POA: Diagnosis not present

## 2016-07-29 DIAGNOSIS — W19XXXA Unspecified fall, initial encounter: Secondary | ICD-10-CM | POA: Diagnosis not present

## 2016-07-29 DIAGNOSIS — N184 Chronic kidney disease, stage 4 (severe): Secondary | ICD-10-CM | POA: Diagnosis not present

## 2016-07-29 DIAGNOSIS — F322 Major depressive disorder, single episode, severe without psychotic features: Secondary | ICD-10-CM | POA: Diagnosis not present

## 2016-07-29 DIAGNOSIS — I10 Essential (primary) hypertension: Secondary | ICD-10-CM | POA: Diagnosis not present

## 2016-07-30 ENCOUNTER — Other Ambulatory Visit: Payer: Self-pay

## 2016-08-06 ENCOUNTER — Encounter (HOSPITAL_COMMUNITY): Payer: Self-pay | Admitting: *Deleted

## 2016-08-06 NOTE — Progress Notes (Signed)
Pt has history of Aortic Valve replacement and CABG done in 2011. Pt's cardiologist is Dr. Anne FuSkains. Pt denies any recent chest pain or sob. Pt will be arriving via wheelchair due to an injured knee 2 weeks ago and cannot walk.

## 2016-08-07 ENCOUNTER — Ambulatory Visit (HOSPITAL_COMMUNITY): Payer: Commercial Managed Care - HMO | Admitting: Anesthesiology

## 2016-08-07 ENCOUNTER — Telehealth: Payer: Self-pay | Admitting: Vascular Surgery

## 2016-08-07 ENCOUNTER — Ambulatory Visit (HOSPITAL_COMMUNITY)
Admission: RE | Admit: 2016-08-07 | Discharge: 2016-08-07 | Disposition: A | Discharge status: Home / Self Care | Payer: Commercial Managed Care - HMO | Source: Ambulatory Visit | Attending: Vascular Surgery | Admitting: Vascular Surgery

## 2016-08-07 ENCOUNTER — Other Ambulatory Visit: Payer: Self-pay | Admitting: *Deleted

## 2016-08-07 ENCOUNTER — Encounter (HOSPITAL_COMMUNITY): Payer: Self-pay | Admitting: Surgery

## 2016-08-07 ENCOUNTER — Encounter (HOSPITAL_COMMUNITY)
Admission: RE | Disposition: A | Discharge status: Home / Self Care | Payer: Self-pay | Source: Ambulatory Visit | Attending: Vascular Surgery

## 2016-08-07 DIAGNOSIS — I129 Hypertensive chronic kidney disease with stage 1 through stage 4 chronic kidney disease, or unspecified chronic kidney disease: Secondary | ICD-10-CM | POA: Insufficient documentation

## 2016-08-07 DIAGNOSIS — E785 Hyperlipidemia, unspecified: Secondary | ICD-10-CM | POA: Diagnosis not present

## 2016-08-07 DIAGNOSIS — I2510 Atherosclerotic heart disease of native coronary artery without angina pectoris: Secondary | ICD-10-CM | POA: Diagnosis not present

## 2016-08-07 DIAGNOSIS — N184 Chronic kidney disease, stage 4 (severe): Secondary | ICD-10-CM | POA: Diagnosis not present

## 2016-08-07 DIAGNOSIS — D631 Anemia in chronic kidney disease: Secondary | ICD-10-CM | POA: Diagnosis not present

## 2016-08-07 DIAGNOSIS — F329 Major depressive disorder, single episode, unspecified: Secondary | ICD-10-CM | POA: Insufficient documentation

## 2016-08-07 DIAGNOSIS — N186 End stage renal disease: Secondary | ICD-10-CM

## 2016-08-07 DIAGNOSIS — Z820 Family history of epilepsy and other diseases of the nervous system: Secondary | ICD-10-CM | POA: Insufficient documentation

## 2016-08-07 DIAGNOSIS — E669 Obesity, unspecified: Secondary | ICD-10-CM | POA: Diagnosis not present

## 2016-08-07 DIAGNOSIS — Z79899 Other long term (current) drug therapy: Secondary | ICD-10-CM | POA: Diagnosis not present

## 2016-08-07 DIAGNOSIS — Z7982 Long term (current) use of aspirin: Secondary | ICD-10-CM | POA: Diagnosis not present

## 2016-08-07 DIAGNOSIS — Z4931 Encounter for adequacy testing for hemodialysis: Secondary | ICD-10-CM

## 2016-08-07 DIAGNOSIS — R5382 Chronic fatigue, unspecified: Secondary | ICD-10-CM | POA: Insufficient documentation

## 2016-08-07 DIAGNOSIS — Z888 Allergy status to other drugs, medicaments and biological substances status: Secondary | ICD-10-CM | POA: Insufficient documentation

## 2016-08-07 DIAGNOSIS — M1990 Unspecified osteoarthritis, unspecified site: Secondary | ICD-10-CM | POA: Insufficient documentation

## 2016-08-07 DIAGNOSIS — N189 Chronic kidney disease, unspecified: Secondary | ICD-10-CM | POA: Diagnosis not present

## 2016-08-07 DIAGNOSIS — D649 Anemia, unspecified: Secondary | ICD-10-CM | POA: Diagnosis not present

## 2016-08-07 DIAGNOSIS — E039 Hypothyroidism, unspecified: Secondary | ICD-10-CM | POA: Insufficient documentation

## 2016-08-07 DIAGNOSIS — M8580 Other specified disorders of bone density and structure, unspecified site: Secondary | ICD-10-CM | POA: Diagnosis not present

## 2016-08-07 DIAGNOSIS — Z6833 Body mass index (BMI) 33.0-33.9, adult: Secondary | ICD-10-CM | POA: Insufficient documentation

## 2016-08-07 DIAGNOSIS — Z8249 Family history of ischemic heart disease and other diseases of the circulatory system: Secondary | ICD-10-CM | POA: Diagnosis not present

## 2016-08-07 HISTORY — DX: Adverse effect of unspecified anesthetic, initial encounter: T41.45XA

## 2016-08-07 HISTORY — PX: AV FISTULA PLACEMENT: SHX1204

## 2016-08-07 HISTORY — DX: Constipation, unspecified: K59.00

## 2016-08-07 HISTORY — DX: Polyneuropathy, unspecified: G62.9

## 2016-08-07 HISTORY — DX: Cerebral infarction, unspecified: I63.9

## 2016-08-07 HISTORY — DX: Calculus of kidney: N20.0

## 2016-08-07 HISTORY — DX: Other complications of anesthesia, initial encounter: T88.59XA

## 2016-08-07 HISTORY — DX: Failed or difficult intubation, initial encounter: T88.4XXA

## 2016-08-07 LAB — POCT I-STAT 4, (NA,K, GLUC, HGB,HCT)
Glucose, Bld: 94 mg/dL (ref 65–99)
HEMATOCRIT: 34 % — AB (ref 36.0–46.0)
HEMOGLOBIN: 11.6 g/dL — AB (ref 12.0–15.0)
Potassium: 5.8 mmol/L — ABNORMAL HIGH (ref 3.5–5.1)
Sodium: 134 mmol/L — ABNORMAL LOW (ref 135–145)

## 2016-08-07 SURGERY — ARTERIOVENOUS (AV) FISTULA CREATION
Anesthesia: Monitor Anesthesia Care | Site: Arm Upper | Laterality: Left

## 2016-08-07 MED ORDER — CHLORHEXIDINE GLUCONATE CLOTH 2 % EX PADS: 6.0000 | MEDICATED_PAD | Freq: Once | CUTANEOUS | Status: DC

## 2016-08-07 MED ORDER — FENTANYL CITRATE (PF) 100 MCG/2ML IJ SOLN
INTRAMUSCULAR | Status: DC | PRN
  Administered 2016-08-07 (×2): 25 ug via INTRAVENOUS
  Administered 2016-08-07: 50 ug via INTRAVENOUS

## 2016-08-07 MED ORDER — ONDANSETRON HCL 4 MG/2ML IJ SOLN
INTRAMUSCULAR | Status: AC
  Filled 2016-08-07: qty 2

## 2016-08-07 MED ORDER — SODIUM CHLORIDE 0.9 % IV SOLN
INTRAVENOUS | Status: DC | PRN
  Administered 2016-08-07: 500 mL

## 2016-08-07 MED ORDER — ONDANSETRON HCL 4 MG/2ML IJ SOLN
INTRAMUSCULAR | Status: DC | PRN
  Administered 2016-08-07: 4 mg via INTRAVENOUS

## 2016-08-07 MED ORDER — DEXTROSE 5 % IV SOLN
1.5000 g | INTRAVENOUS | Status: AC
  Administered 2016-08-07: 1.5 g via INTRAVENOUS

## 2016-08-07 MED ORDER — OXYCODONE-ACETAMINOPHEN 5-325 MG PO TABS: 1.0000 | ORAL_TABLET | Freq: Four times a day (QID) | ORAL | 0 refills | Status: DC | PRN

## 2016-08-07 MED ORDER — MIDAZOLAM HCL 5 MG/5ML IJ SOLN
INTRAMUSCULAR | Status: DC | PRN
  Administered 2016-08-07: 2 mg via INTRAVENOUS

## 2016-08-07 MED ORDER — HEPARIN SODIUM (PORCINE) 1000 UNIT/ML IJ SOLN
INTRAMUSCULAR | Status: AC
  Filled 2016-08-07: qty 1

## 2016-08-07 MED ORDER — PROPOFOL 500 MG/50ML IV EMUL
INTRAVENOUS | Status: DC | PRN
  Administered 2016-08-07: 25 ug/kg/min via INTRAVENOUS

## 2016-08-07 MED ORDER — PROPOFOL 10 MG/ML IV BOLUS
INTRAVENOUS | Status: DC | PRN
  Administered 2016-08-07 (×2): 30 mg via INTRAVENOUS

## 2016-08-07 MED ORDER — PROPOFOL 10 MG/ML IV BOLUS
INTRAVENOUS | Status: AC
  Filled 2016-08-07: qty 40

## 2016-08-07 MED ORDER — DEXTROSE 5 % IV SOLN
INTRAVENOUS | Status: AC
  Filled 2016-08-07: qty 1.5

## 2016-08-07 MED ORDER — LIDOCAINE-EPINEPHRINE (PF) 1 %-1:200000 IJ SOLN
INTRAMUSCULAR | Status: DC | PRN
  Administered 2016-08-07: 7 mL

## 2016-08-07 MED ORDER — SODIUM CHLORIDE 0.9 % IV SOLN
INTRAVENOUS | Status: DC
  Administered 2016-08-07: 13:00:00 via INTRAVENOUS

## 2016-08-07 MED ORDER — MIDAZOLAM HCL 2 MG/2ML IJ SOLN
INTRAMUSCULAR | Status: AC
  Filled 2016-08-07: qty 2

## 2016-08-07 MED ORDER — FENTANYL CITRATE (PF) 100 MCG/2ML IJ SOLN
INTRAMUSCULAR | Status: AC
  Filled 2016-08-07: qty 2

## 2016-08-07 MED ORDER — LIDOCAINE-EPINEPHRINE (PF) 1 %-1:200000 IJ SOLN
INTRAMUSCULAR | Status: AC
  Filled 2016-08-07: qty 30

## 2016-08-07 MED ORDER — LIDOCAINE 2% (20 MG/ML) 5 ML SYRINGE
INTRAMUSCULAR | Status: AC
  Filled 2016-08-07: qty 5

## 2016-08-07 MED ORDER — 0.9 % SODIUM CHLORIDE (POUR BTL) OPTIME
TOPICAL | Status: DC | PRN
  Administered 2016-08-07: 1000 mL

## 2016-08-07 SURGICAL SUPPLY — 40 items
APL SKNCLS STERI-STRIP NONHPOA (GAUZE/BANDAGES/DRESSINGS) ×1
ARMBAND PINK RESTRICT EXTREMIT (MISCELLANEOUS) ×4 IMPLANT
BENZOIN TINCTURE PRP APPL 2/3 (GAUZE/BANDAGES/DRESSINGS) ×3 IMPLANT
CANISTER SUCTION 2500CC (MISCELLANEOUS) ×3 IMPLANT
CANNULA VESSEL 3MM 2 BLNT TIP (CANNULA) ×3 IMPLANT
CLIP LIGATING EXTRA MED SLVR (CLIP) ×3 IMPLANT
CLIP LIGATING EXTRA SM BLUE (MISCELLANEOUS) ×3 IMPLANT
CLOSURE STERI-STRIP 1/2X4 (GAUZE/BANDAGES/DRESSINGS) ×1
CLOSURE WOUND 1/2 X4 (GAUZE/BANDAGES/DRESSINGS) ×1
CLSR STERI-STRIP ANTIMIC 1/2X4 (GAUZE/BANDAGES/DRESSINGS) ×1 IMPLANT
COVER PROBE W GEL 5X96 (DRAPES) ×3 IMPLANT
DECANTER SPIKE VIAL GLASS SM (MISCELLANEOUS) ×3 IMPLANT
DRAPE ORTHO SPLIT 77X108 STRL (DRAPES) ×3
DRAPE SURG ORHT 6 SPLT 77X108 (DRAPES) IMPLANT
ELECT REM PT RETURN 9FT ADLT (ELECTROSURGICAL) ×3
ELECTRODE REM PT RTRN 9FT ADLT (ELECTROSURGICAL) ×1 IMPLANT
GAUZE SPONGE 4X4 12PLY STRL (GAUZE/BANDAGES/DRESSINGS) ×3 IMPLANT
GEL ULTRASOUND 20GR AQUASONIC (MISCELLANEOUS) IMPLANT
GLOVE BIOGEL PI IND STRL 6.5 (GLOVE) IMPLANT
GLOVE BIOGEL PI IND STRL 7.0 (GLOVE) IMPLANT
GLOVE BIOGEL PI INDICATOR 6.5 (GLOVE) ×2
GLOVE BIOGEL PI INDICATOR 7.0 (GLOVE) ×2
GLOVE SS BIOGEL STRL SZ 7.5 (GLOVE) ×1 IMPLANT
GLOVE SUPERSENSE BIOGEL SZ 7.5 (GLOVE) ×2
GLOVE SURG SS PI 7.0 STRL IVOR (GLOVE) ×2 IMPLANT
GOWN STRL REUS W/ TWL LRG LVL3 (GOWN DISPOSABLE) ×3 IMPLANT
GOWN STRL REUS W/TWL LRG LVL3 (GOWN DISPOSABLE) ×9
KIT BASIN OR (CUSTOM PROCEDURE TRAY) ×3 IMPLANT
KIT ROOM TURNOVER OR (KITS) ×3 IMPLANT
NS IRRIG 1000ML POUR BTL (IV SOLUTION) ×3 IMPLANT
PACK CV ACCESS (CUSTOM PROCEDURE TRAY) ×3 IMPLANT
PAD ARMBOARD 7.5X6 YLW CONV (MISCELLANEOUS) ×6 IMPLANT
SPONGE GAUZE 4X4 12PLY STER LF (GAUZE/BANDAGES/DRESSINGS) ×2 IMPLANT
STRIP CLOSURE SKIN 1/2X4 (GAUZE/BANDAGES/DRESSINGS) ×2 IMPLANT
SUT PROLENE 6 0 CC (SUTURE) ×3 IMPLANT
SUT VIC AB 3-0 SH 27 (SUTURE) ×3
SUT VIC AB 3-0 SH 27X BRD (SUTURE) ×1 IMPLANT
TAPE CLOTH SURG 4X10 WHT LF (GAUZE/BANDAGES/DRESSINGS) ×2 IMPLANT
UNDERPAD 30X30 (UNDERPADS AND DIAPERS) ×3 IMPLANT
WATER STERILE IRR 1000ML POUR (IV SOLUTION) ×3 IMPLANT

## 2016-08-07 NOTE — H&P (Signed)
Office Visit   06/17/2016 [facility]  Todd F Early, MD  Vascular Surgery   Chronic renal insufficiency, stage 4 (severe) [facility](HCC)  Dx   New Evaluation ; Referred by Shaune Pollackonna Gates, MD  Reason for Visit   Additional Documentation   Vitals:   BP  185/68 (BP Location: Left Arm, Patient Position: Sitting, Cuff Size: Normal)    Pulse  59   Ht 5' (1.524 m)   Wt 164 lb (74.4 kg)   SpO2 97%   BMI 32.03 kg/m   BSA 1.77 m      More Vitals   Flowsheets:   Infectious Disease Screening,   Healthcare Directives,   Amb Nursing Assessment,   Custom Formula Data,   MEWS Score,   Anthropometrics     Encounter Info:   Billing Info,   History,   Allergies,   Detailed Report     All Notes   Progress Notes by Larina Earthlyodd F Early, MD at 06/17/2016 1:30 PM   Author: Larina Earthlyodd F Early, MD Author Type: Physician Filed: 06/17/2016 2:18 PM  Note Status: Signed Cosign: Cosign Not Required Encounter Date: 06/17/2016  Editor: Larina Earthlyodd F Early, MD (Physician)      [facility]  Patient name: Jill Johnson          MRN: [id number]        DOB: 1941/07/04          Sex: female  REASON FOR CONSULT: Discuss hemodialysis access  HPI: Jill Perfectancy L Debruhl is a 75 y.o. female, who is in today for discussion of hemodialysis access. She has had progressive renal insufficiency isn't approaching need for hemodialysis access. She is quite upset regarding this possibility and has not been willing to consider dialysis to this point. She has had no prior access for hemodialysis. His status post coronary artery bypass grafting in number of years ago. She is a retired Engineer, civil (consulting)nurse.      Past Medical History:  Diagnosis Date  . Anemia   . CAD (coronary artery disease)   . CFS (chronic fatigue syndrome)   . Chronic rhinitis   . Coronary atherosclerosis   . Depression   . DJD (degenerative joint disease)   . Hyperlipidemia   . Hypertension   . Hypothyroidism   . MDD  (major depressive disorder) (HCC)   . Obesity   . Osteopenia   . Renal insufficiency    Post Op         Family History  Problem Relation Age of Onset  . Alzheimer's disease Mother   . Aortic stenosis Father   . Heart disease Father   . Heart disease Son     SOCIAL HISTORY: Social History        Social History  . Marital status: Divorced    Spouse name: N/A  . Number of children: N/A  . Years of education: N/A       Occupational History  . RETIRED NURSE        Social History Main Topics  . Smoking status: Never Smoker  . Smokeless tobacco: Never Used  . Alcohol use No  . Drug use: No  . Sexual activity: Not on file       Other Topics Concern  . Not on file      Social History Narrative  . No narrative on file         Allergies  Allergen Reactions  . Prednisone  Makes her loony          Current Outpatient Prescriptions  Medication Sig Dispense Refill  . acetaminophen (TYLENOL) 650 MG CR tablet Take 650 mg by mouth every 8 (eight) hours as needed for pain.    Marland Kitchen amLODipine (NORVASC) 10 MG tablet Take 1 tablet (10 mg total) by mouth daily. 30 tablet 5  . aspirin 325 MG tablet Take 325 mg by mouth daily.    . cholecalciferol (VITAMIN D) 1000 units tablet Take 1,000 Units by mouth daily.    . citalopram (CELEXA) 20 MG tablet Take 20 mg by mouth daily.    . fenofibrate 160 MG tablet Take 160 mg by mouth daily.    . ferrous sulfate 325 (65 FE) MG tablet Take 4 tablets by mout daiy  12  . furosemide (LASIX) 40 MG tablet Take 40 mg by mouth daily.    Marland Kitchen gabapentin (NEURONTIN) 300 MG capsule     . Glucosamine-Chondroit-Vit C-Mn (GLUCOSAMINE CHONDR 1500 COMPLX PO) Take 2 tablets by mouth daily.     Marland Kitchen levothyroxine (SYNTHROID, LEVOTHROID) 112 MCG tablet Take 112 mcg by mouth daily before breakfast.     . losartan (COZAAR) 25 MG tablet Take 25 mg by mouth daily.     . Magnesium Hydroxide (MAGNESIA PO) Take 1  tablet by mouth at bedtime.     . pantoprazole (PROTONIX) 40 MG tablet TK 1 T PO QD  3  . FLUoxetine (PROZAC) 10 MG capsule Take 10 mg by mouth daily.     No current facility-administered medications for this visit.     REVIEW OF SYSTEMS:  [X]  denotes positive finding, [ ]  denotes negative finding Cardiac  Comments:  Chest pain or chest pressure:    Shortness of breath upon exertion: x   Short of breath when lying flat:    Irregular heart rhythm:        Vascular    Pain in calf, thigh, or hip brought on by ambulation:    Pain in feet at night that wakes you up from your sleep:     Blood clot in your veins:    Leg swelling:  x       Pulmonary    Oxygen at home:    Productive cough:     Wheezing:         Neurologic    Sudden weakness in arms or legs:     Sudden numbness in arms or legs:     Sudden onset of difficulty speaking or slurred speech:    Temporary loss of vision in one eye:     Problems with dizziness:         Gastrointestinal    Blood in stool:     Vomited blood:         Genitourinary    Burning when urinating:     Blood in urine:        Psychiatric    Major depression:  x       Hematologic    Bleeding problems:    Problems with blood clotting too easily:        Skin    Rashes or ulcers:        Constitutional    Fever or chills:      PHYSICAL EXAM:      Vitals:   06/17/16 1304 06/17/16 1307 06/17/16 1308  BP: (!) 192/68 (!) 190/64 (!) 185/68  Pulse: (!) 59    SpO2: 97%  Weight: 164 lb (74.4 kg)    Height: 5' (1.524 m)      GENERAL: The patient is a well-nourished female, in no acute distress. The vital signs are documented above. VASCULAR: 2+ radial and brachial pulses bilaterally PULMONARY: There is good air exchange MUSCULOSKELETAL: There are no major deformities or cyanosis. NEUROLOGIC: No focal weakness or paresthesias  are detected. SKIN: There are no ulcers or rashes noted. PSYCHIATRIC: The patient has a normal affect.  DATA:  Upper extremity arterial studies are normal bilaterally with normal pressures and waveforms. Left cephalic vein seems moderate sized in the forearm and nicely sized in the left upper arm. Seligman vein is patent and the left as well. Right cephalic vein is of good caliber from the antecubital space proximally.  MEDICAL ISSUES: Discussion with patient regarding options for hemodialysis. I discussed the temporary catheter, AV fistula and AV graft and also the limitations of all of these. She does appear to have adequate vein size for attempted fistula on the left. Have recommended left arm AV fistula placement. Explain the possibility of non-maturation and occlusion. She is not willing to consent to access placement currently and wished to continue to discuss this with Dr. Marisue Humble. We will be available as needed for left arm AV fistula creation.   Gretta Began [facility] [place] [facility] [phone]       Instructions    After Visit Summary (Printed 06/17/2016)  Communications      [facility] Provider CC Chart Rep sent to Shaune Pollack, MD  Media   Electronic signature on 06/17/2016 10:41 AM   Orders Placed    None  Medication Changes     None    Medication List  Visit Diagnoses      Chronic renal insufficiency, stage 4 (severe) (HCC)    Problem List  Level of Service   Level of Service  PR OFFICE OUTPATIENT NEW 45 MINUTES [99204]  All Charges for This Encounter   Code  13086  Description: PR OFFICE OUTPATIENT NEW 45 MINUTES  Service Date: 06/17/2016  Service Provider: Larina Earthly, MD  Qty: 1   Addendum:  The patient has been re-examined and re-evaluated.  The patient's history and physical has been reviewed and is unchanged.    Jill Johnson is a 75 y.o. female is being admitted with Stage IV Chronic Kidney Disease N18.4. All the  risks, benefits and other treatment options have been discussed with the patient. The patient has consented to proceed with Procedure(s): ARTERIOVENOUS (AV) FISTULA CREATION as a surgical intervention.  Gretta Began 08/07/2016 12:19 PM Vascular and Vein Surgery

## 2016-08-07 NOTE — Transfer of Care (Signed)
Immediate Anesthesia Transfer of Care Note  Patient: Dorthula PerfectNancy L Jeziorski  Procedure(s) Performed: Procedure(s): CREATION OF  LEFT UPPER ARM ARTERIOVENOUS (AV) FISTULA (Left)  Patient Location: PACU  Anesthesia Type:MAC  Level of Consciousness: awake, alert , oriented and patient cooperative  Airway & Oxygen Therapy: Patient Spontanous Breathing and Patient connected to nasal cannula oxygen  Post-op Assessment: Report given to RN, Post -op Vital signs reviewed and stable and Patient moving all extremities  Post vital signs: Reviewed and stable  Last Vitals:  Vitals:   08/07/16 1123 08/07/16 1149  BP:  (!) 239/65  Pulse: 64   Resp: 20   Temp: 36.8 C     Last Pain:  Vitals:   08/07/16 1123  TempSrc: Oral  PainSc:       Patients Stated Pain Goal: 9 (08/07/16 1106)  Complications: No apparent anesthesia complications

## 2016-08-07 NOTE — Anesthesia Postprocedure Evaluation (Signed)
Anesthesia Post Note  Patient: Jill Johnson  Procedure(s) Performed: Procedure(s) (LRB): CREATION OF  LEFT UPPER ARM ARTERIOVENOUS (AV) FISTULA (Left)  Patient location during evaluation: Endoscopy Anesthesia Type: MAC Level of consciousness: awake Pain management: pain level controlled Vital Signs Assessment: post-procedure vital signs reviewed and stable Respiratory status: spontaneous breathing Cardiovascular status: stable Anesthetic complications: no    Last Vitals:  Vitals:   08/07/16 1123 08/07/16 1149  BP:  (!) 239/65  Pulse: 64   Resp: 20   Temp: 36.8 C     Last Pain:  Vitals:   08/07/16 1123  TempSrc: Oral  PainSc:                  EDWARDS,Benjamine Strout

## 2016-08-07 NOTE — Op Note (Signed)
    OPERATIVE REPORT  DATE OF SURGERY: 08/07/2016  PATIENT: Jill Johnson, 75 y.o. female MRN: [id number]  DOB: 03/06/1941  PRE-OPERATIVE DIAGNOSIS: Chronic renal insufficiency  POST-OPERATIVE DIAGNOSIS:  Same  PROCEDURE: Left upper arm AV fistula creation  SURGEON:  Gretta Beganodd Early, M.D.  PHYSICIAN ASSISTANT: Nurse  ANESTHESIA:  Local with sedation  EBL: Minimal ml  Total I/O In: 300 [I.V.:300] Out: 25 [Blood:25]  BLOOD ADMINISTERED: None  DRAINS: None  SPECIMEN: None  COUNTS CORRECT:  YES  PLAN OF CARE: PACU   PATIENT DISPOSITION:  PACU - hemodynamically stable  PROCEDURE DETAILS: Patient was taken to the operative placed supine position where the area of the left arm prepped and draped in sterile fashion. SonoSite ultrasound was used to visualize the left arm. The antecubital vein was a very large caliber. The vein in the forearm was small. Patient large brachial artery as well. Using local anesthesia, incision was made between the cephalic vein at the antecubital space and the brachial artery. Cephalic vein tributary branches were ligated with 301 4-0 silk ties and divided. The vein was divided distally and was ligated. The vein was brought into approximation of the brachial artery. The artery was occluded proximally and distally and was opened with 11 blade some ulcerative Potts scissors. The vein was cut to appropriate length and was sewn end-to-side to the artery with a running 6-0 Prolene suture. Clamps removed and excellent thrill was noted. The wound irrigated with saline. Hemostasis tablet cautery. Wounds were closed with 3-0 Vicryl in the subcutaneous subcuticular tissue. Benzoin and Steri-Strips were applied. Patient had a palpable radial pulse and was transferred to the recovery room in stable condition   Larina Earthlyodd F. Early, M.D., [facility] 08/07/2016 2:21 PM

## 2016-08-07 NOTE — Telephone Encounter (Signed)
-----   Message from Sharee PimpleMarilyn K McChesney, RN sent at 08/07/2016 12:28 PM EDT ----- Regarding: schedule   ----- Message ----- From: Raymond GurneyKimberly A Trinh, PA-C Sent: 08/07/2016  12:11 PM To: [facility] Charge Pool  S/p left arm fistula 08/07/16  F/u with TFE in 4 weeks with duplex  Thanks Selena BattenKim

## 2016-08-07 NOTE — Telephone Encounter (Signed)
Called home # no answer, mailed letter for appt on 11/7 with Doc and [place]

## 2016-08-07 NOTE — Discharge Instructions (Signed)
° ° °  08/07/2016 Jill Johnson [id number] [birth year]  Surgeon(s): Larina Earthlyodd F Early, MD  Procedure(s): ARTERIOVENOUS (AV) FISTULA CREATION  x Do not stick graft for 12 weeks

## 2016-08-07 NOTE — Anesthesia Preprocedure Evaluation (Addendum)
Anesthesia Evaluation  Patient identified by MRN, date of birth, ID band Patient awake    Reviewed: Allergy & Precautions, Patient's Chart, lab work & pertinent test results  History of Anesthesia Complications (+) DIFFICULT AIRWAY  Airway Mallampati: II  TM Distance: >3 FB     Dental   Pulmonary    breath sounds clear to auscultation       Cardiovascular hypertension, + CAD   Rhythm:Regular     Neuro/Psych    GI/Hepatic negative GI ROS,   Endo/Other  Hypothyroidism   Renal/GU Renal disease     Musculoskeletal  (+) Arthritis ,   Abdominal   Peds  Hematology   Anesthesia Other Findings   Reproductive/Obstetrics                            Anesthesia Physical Anesthesia Plan  ASA: III  Anesthesia Plan: MAC   Post-op Pain Management:    Induction: Intravenous  Airway Management Planned: Simple Face Mask  Additional Equipment:   Intra-op Plan:   Post-operative Plan:   Informed Consent: I have reviewed the patients History and Physical, chart, labs and discussed the procedure including the risks, benefits and alternatives for the proposed anesthesia with the patient or authorized representative who has indicated his/her understanding and acceptance.   Dental advisory given  Plan Discussed with: CRNA and Anesthesiologist  Anesthesia Plan Comments:         Anesthesia Quick Evaluation

## 2016-08-09 ENCOUNTER — Encounter (HOSPITAL_COMMUNITY): Payer: Self-pay | Admitting: Vascular Surgery

## 2016-08-10 DIAGNOSIS — I10 Essential (primary) hypertension: Secondary | ICD-10-CM | POA: Diagnosis not present

## 2016-08-10 DIAGNOSIS — Z23 Encounter for immunization: Secondary | ICD-10-CM | POA: Diagnosis not present

## 2016-08-10 DIAGNOSIS — N185 Chronic kidney disease, stage 5: Secondary | ICD-10-CM | POA: Diagnosis not present

## 2016-08-10 DIAGNOSIS — Z6833 Body mass index (BMI) 33.0-33.9, adult: Secondary | ICD-10-CM | POA: Diagnosis not present

## 2016-08-10 DIAGNOSIS — N2581 Secondary hyperparathyroidism of renal origin: Secondary | ICD-10-CM | POA: Diagnosis not present

## 2016-08-10 DIAGNOSIS — D649 Anemia, unspecified: Secondary | ICD-10-CM | POA: Diagnosis not present

## 2016-08-10 DIAGNOSIS — I770 Arteriovenous fistula, acquired: Secondary | ICD-10-CM | POA: Diagnosis not present

## 2016-08-20 DIAGNOSIS — L0889 Other specified local infections of the skin and subcutaneous tissue: Secondary | ICD-10-CM | POA: Diagnosis not present

## 2016-08-20 DIAGNOSIS — L03115 Cellulitis of right lower limb: Secondary | ICD-10-CM | POA: Diagnosis not present

## 2016-08-20 DIAGNOSIS — I8311 Varicose veins of right lower extremity with inflammation: Secondary | ICD-10-CM | POA: Diagnosis not present

## 2016-08-20 DIAGNOSIS — I872 Venous insufficiency (chronic) (peripheral): Secondary | ICD-10-CM | POA: Diagnosis not present

## 2016-08-20 DIAGNOSIS — I8312 Varicose veins of left lower extremity with inflammation: Secondary | ICD-10-CM | POA: Diagnosis not present

## 2016-09-11 ENCOUNTER — Encounter: Payer: Self-pay | Admitting: Vascular Surgery

## 2016-09-15 ENCOUNTER — Ambulatory Visit (HOSPITAL_COMMUNITY)
Admit: 2016-09-15 | Discharge: 2016-09-15 | Disposition: A | Discharge status: Home / Self Care | Payer: Commercial Managed Care - HMO | Source: Ambulatory Visit | Attending: Vascular Surgery | Admitting: Vascular Surgery

## 2016-09-15 ENCOUNTER — Ambulatory Visit (INDEPENDENT_AMBULATORY_CARE_PROVIDER_SITE_OTHER): Payer: Commercial Managed Care - HMO | Admitting: Vascular Surgery

## 2016-09-15 ENCOUNTER — Encounter: Payer: Self-pay | Admitting: Vascular Surgery

## 2016-09-15 VITALS — BP 193/68 | HR 64 | Temp 97.2°F | Resp 18 | Ht 60.0 in | Wt 168.0 lb

## 2016-09-15 DIAGNOSIS — Z4931 Encounter for adequacy testing for hemodialysis: Secondary | ICD-10-CM | POA: Insufficient documentation

## 2016-09-15 DIAGNOSIS — Z6833 Body mass index (BMI) 33.0-33.9, adult: Secondary | ICD-10-CM | POA: Diagnosis not present

## 2016-09-15 DIAGNOSIS — D649 Anemia, unspecified: Secondary | ICD-10-CM | POA: Diagnosis not present

## 2016-09-15 DIAGNOSIS — N186 End stage renal disease: Secondary | ICD-10-CM | POA: Diagnosis not present

## 2016-09-15 DIAGNOSIS — N2581 Secondary hyperparathyroidism of renal origin: Secondary | ICD-10-CM | POA: Diagnosis not present

## 2016-09-15 DIAGNOSIS — I770 Arteriovenous fistula, acquired: Secondary | ICD-10-CM | POA: Diagnosis not present

## 2016-09-15 DIAGNOSIS — I10 Essential (primary) hypertension: Secondary | ICD-10-CM | POA: Diagnosis not present

## 2016-09-15 DIAGNOSIS — N184 Chronic kidney disease, stage 4 (severe): Secondary | ICD-10-CM

## 2016-09-15 DIAGNOSIS — R809 Proteinuria, unspecified: Secondary | ICD-10-CM | POA: Diagnosis not present

## 2016-09-15 DIAGNOSIS — N185 Chronic kidney disease, stage 5: Secondary | ICD-10-CM | POA: Diagnosis not present

## 2016-09-15 NOTE — Progress Notes (Signed)
Patient name: Jill Perfectancy L Quintero MRN: [id number] DOB: 1941/01/01 Sex: female  REASON FOR VISIT: Postop  HPI: Jill Johnson is a 75 y.o. female who presents for postoperative follow-up status post left brachiocephalic AV fistula on 08/07/2016. The patient is not yet on hemodialysis. She denies any pain with her left arm or hand. Her primary nephrologist is Dr. Marisue HumbleSanford. She saw him today and says that she may be able to hold off on dialysis until the first of the year. She does have leg swelling with some sores. Her Lasix was increased to 4 times a day by Dr. Marisue HumbleSanford.  Current Outpatient Prescriptions  Medication Sig Dispense Refill  . acetaminophen (TYLENOL) 650 MG CR tablet Take 650 mg by mouth 2 (two) times daily.     Marland Kitchen. amLODipine (NORVASC) 10 MG tablet Take 1 tablet (10 mg total) by mouth daily. 30 tablet 5  . aspirin 325 MG tablet Take 325 mg by mouth daily.    . cholecalciferol (VITAMIN D) 1000 units tablet Take 2,000 Units by mouth daily.     . Cyanocobalamin (VITAMIN B-12 PO) Take 1 tablet by mouth daily.    . furosemide (LASIX) 40 MG tablet Take 160 mg by mouth daily.     Marland Kitchen. gabapentin (NEURONTIN) 300 MG capsule Take 300 mg by mouth daily.     Marland Kitchen. levothyroxine (SYNTHROID, LEVOTHROID) 112 MCG tablet Take 112 mcg by mouth daily before breakfast.     . losartan (COZAAR) 25 MG tablet Take 50 mg by mouth at bedtime.     . Magnesium Hydroxide (MAGNESIA PO) Take 1 tablet by mouth at bedtime.     Marland Kitchen. UNABLE TO FIND Iron Infusion once a month     No current facility-administered medications for this visit.     REVIEW OF SYSTEMS:  [X]  denotes positive finding, [ ]  denotes negative finding Cardiac  Comments:  Chest pain or chest pressure:    Shortness of breath upon exertion:    Short of breath when lying flat:    Irregular heart rhythm:    Constitutional    Fever or chills:      PHYSICAL EXAM: Vitals:   09/15/16 1540  BP: (!) 193/68  Pulse: 64  Resp: 18  Temp: 97.2 F (36.2 C)    TempSrc: Oral  SpO2: 96%  Weight: 168 lb (76.2 kg)  Height: 5' (1.524 m)    GENERAL: The patient is a well-nourished female, in no acute distress. The vital signs are documented above. PULMONARY: Non-labored respiratory effort. VASCULAR: Left antecubital incision well healed. Easily palpable thrill left upper arm fistula. Bilateral lower extremity swelling with mild erythema and superficial sores.  DATA:  Dialysis access duplex 09/15/2016  Left upper arm fistula with diameters of 0.83 at the distal brachium to 0.96 cm at the proximal brachium. Depths are less than 1 cm throughout.  MEDICAL ISSUES: Chronic kidney disease stage IV  The patient's left upper arm fistula is maturing well. She denies any steal symptoms. She is not yet on dialysis. Access will be ready for use on 11/06/2016. Discussed that if she did require dialysis before then, may cannulate at the two-month mark (10/07/16) to avoid catheter insertion. She will follow up on an as-needed basis.  Maris BergerKimberly Trinh, PA-C [facility] [place]    I have examined the patient, reviewed and agree with above. Very nice Yareliz Thorstenson maturation of fistula. I feel this has an excellent chance of being providing reasonable access. She will continue her  follow-up with Dr. Marisue HumbleSanford and see us again on an as-needed basis  [name], Harumi Yamin, MD 09/15/2016 4:15 PM

## 2016-10-06 ENCOUNTER — Encounter (HOSPITAL_COMMUNITY)
Admission: RE | Admit: 2016-10-06 | Discharge: 2016-10-06 | Disposition: A | Discharge status: Home / Self Care | Payer: Commercial Managed Care - HMO | Source: Ambulatory Visit | Attending: Nephrology | Admitting: Nephrology

## 2016-10-06 DIAGNOSIS — N184 Chronic kidney disease, stage 4 (severe): Secondary | ICD-10-CM | POA: Insufficient documentation

## 2016-10-06 DIAGNOSIS — D638 Anemia in other chronic diseases classified elsewhere: Secondary | ICD-10-CM | POA: Insufficient documentation

## 2016-10-20 ENCOUNTER — Ambulatory Visit (HOSPITAL_COMMUNITY)
Admission: RE | Admit: 2016-10-20 | Discharge: 2016-10-20 | Disposition: A | Discharge status: Home / Self Care | Payer: Commercial Managed Care - HMO | Source: Ambulatory Visit | Attending: Nephrology | Admitting: Nephrology

## 2016-10-20 ENCOUNTER — Encounter (HOSPITAL_COMMUNITY): Payer: Self-pay

## 2016-10-20 DIAGNOSIS — N184 Chronic kidney disease, stage 4 (severe): Secondary | ICD-10-CM | POA: Diagnosis not present

## 2016-10-20 DIAGNOSIS — Z79899 Other long term (current) drug therapy: Secondary | ICD-10-CM | POA: Diagnosis not present

## 2016-10-20 DIAGNOSIS — D631 Anemia in chronic kidney disease: Secondary | ICD-10-CM | POA: Diagnosis not present

## 2016-10-20 DIAGNOSIS — N183 Chronic kidney disease, stage 3 unspecified: Secondary | ICD-10-CM

## 2016-10-20 DIAGNOSIS — Z5181 Encounter for therapeutic drug level monitoring: Secondary | ICD-10-CM | POA: Diagnosis not present

## 2016-10-20 LAB — RENAL FUNCTION PANEL
ALBUMIN: 3.5 g/dL (ref 3.5–5.0)
ANION GAP: 10 (ref 5–15)
BUN: 101 mg/dL — ABNORMAL HIGH (ref 6–20)
CALCIUM: 8.5 mg/dL — AB (ref 8.9–10.3)
CO2: 26 mmol/L (ref 22–32)
Chloride: 103 mmol/L (ref 101–111)
Creatinine, Ser: 4.17 mg/dL — ABNORMAL HIGH (ref 0.44–1.00)
GFR, EST AFRICAN AMERICAN: 11 mL/min — AB (ref >60)
GFR, EST NON AFRICAN AMERICAN: 9 mL/min — AB (ref >60)
GLUCOSE: 131 mg/dL — AB (ref 65–99)
PHOSPHORUS: 5.1 mg/dL — AB (ref 2.5–4.6)
Potassium: 3.8 mmol/L (ref 3.5–5.1)
SODIUM: 139 mmol/L (ref 135–145)

## 2016-10-20 LAB — HEMOGLOBIN: Hemoglobin: 8.1 g/dL — ABNORMAL LOW (ref 12.0–15.0)

## 2016-10-20 LAB — IRON AND TIBC
Iron: 49 ug/dL (ref 28–170)
Saturation Ratios: 14 % (ref 10.4–31.8)
TIBC: 354 ug/dL (ref 250–450)
UIBC: 305 ug/dL

## 2016-10-20 LAB — FERRITIN: Ferritin: 387 ng/mL — ABNORMAL HIGH (ref 11–307)

## 2016-10-20 MED ORDER — DARBEPOETIN ALFA 200 MCG/0.4ML IJ SOSY
150.0000 ug | PREFILLED_SYRINGE | INTRAMUSCULAR | Status: DC
  Administered 2016-10-20: 150 ug via SUBCUTANEOUS
  Filled 2016-10-20: qty 0.4

## 2016-10-21 LAB — PTH, INTACT AND CALCIUM
Calcium, Total (PTH): 8.4 mg/dL — ABNORMAL LOW (ref 8.7–10.3)
PTH: 104 pg/mL — ABNORMAL HIGH (ref 15–65)

## 2016-10-27 DIAGNOSIS — I770 Arteriovenous fistula, acquired: Secondary | ICD-10-CM | POA: Diagnosis not present

## 2016-10-27 DIAGNOSIS — N185 Chronic kidney disease, stage 5: Secondary | ICD-10-CM | POA: Diagnosis not present

## 2016-10-27 DIAGNOSIS — D649 Anemia, unspecified: Secondary | ICD-10-CM | POA: Diagnosis not present

## 2016-10-27 DIAGNOSIS — N2581 Secondary hyperparathyroidism of renal origin: Secondary | ICD-10-CM | POA: Diagnosis not present

## 2016-10-27 DIAGNOSIS — I10 Essential (primary) hypertension: Secondary | ICD-10-CM | POA: Diagnosis not present

## 2016-10-27 DIAGNOSIS — R809 Proteinuria, unspecified: Secondary | ICD-10-CM | POA: Diagnosis not present

## 2016-10-27 DIAGNOSIS — Z6833 Body mass index (BMI) 33.0-33.9, adult: Secondary | ICD-10-CM | POA: Diagnosis not present

## 2016-10-28 ENCOUNTER — Emergency Department (HOSPITAL_BASED_OUTPATIENT_CLINIC_OR_DEPARTMENT_OTHER): Payer: Commercial Managed Care - HMO

## 2016-10-28 ENCOUNTER — Encounter (HOSPITAL_BASED_OUTPATIENT_CLINIC_OR_DEPARTMENT_OTHER): Payer: Self-pay | Admitting: *Deleted

## 2016-10-28 ENCOUNTER — Inpatient Hospital Stay (HOSPITAL_BASED_OUTPATIENT_CLINIC_OR_DEPARTMENT_OTHER)
Admission: EM | Admit: 2016-10-28 | Discharge: 2016-11-07 | DRG: 291 | Disposition: A | Discharge status: Skilled Nursing Facility | Payer: Commercial Managed Care - HMO | Attending: Family Medicine | Admitting: Family Medicine

## 2016-10-28 DIAGNOSIS — I248 Other forms of acute ischemic heart disease: Secondary | ICD-10-CM | POA: Diagnosis present

## 2016-10-28 DIAGNOSIS — I739 Peripheral vascular disease, unspecified: Secondary | ICD-10-CM | POA: Diagnosis present

## 2016-10-28 DIAGNOSIS — Z992 Dependence on renal dialysis: Secondary | ICD-10-CM | POA: Diagnosis not present

## 2016-10-28 DIAGNOSIS — Z952 Presence of prosthetic heart valve: Secondary | ICD-10-CM | POA: Diagnosis not present

## 2016-10-28 DIAGNOSIS — R600 Localized edema: Secondary | ICD-10-CM | POA: Diagnosis not present

## 2016-10-28 DIAGNOSIS — T39015A Adverse effect of aspirin, initial encounter: Secondary | ICD-10-CM | POA: Diagnosis present

## 2016-10-28 DIAGNOSIS — Z951 Presence of aortocoronary bypass graft: Secondary | ICD-10-CM | POA: Diagnosis not present

## 2016-10-28 DIAGNOSIS — N179 Acute kidney failure, unspecified: Secondary | ICD-10-CM | POA: Diagnosis not present

## 2016-10-28 DIAGNOSIS — R748 Abnormal levels of other serum enzymes: Secondary | ICD-10-CM | POA: Diagnosis not present

## 2016-10-28 DIAGNOSIS — K319 Disease of stomach and duodenum, unspecified: Secondary | ICD-10-CM | POA: Diagnosis present

## 2016-10-28 DIAGNOSIS — R079 Chest pain, unspecified: Secondary | ICD-10-CM

## 2016-10-28 DIAGNOSIS — E785 Hyperlipidemia, unspecified: Secondary | ICD-10-CM | POA: Diagnosis present

## 2016-10-28 DIAGNOSIS — E039 Hypothyroidism, unspecified: Secondary | ICD-10-CM | POA: Diagnosis not present

## 2016-10-28 DIAGNOSIS — N2581 Secondary hyperparathyroidism of renal origin: Secondary | ICD-10-CM | POA: Diagnosis present

## 2016-10-28 DIAGNOSIS — I340 Nonrheumatic mitral (valve) insufficiency: Secondary | ICD-10-CM | POA: Diagnosis present

## 2016-10-28 DIAGNOSIS — R778 Other specified abnormalities of plasma proteins: Secondary | ICD-10-CM | POA: Diagnosis present

## 2016-10-28 DIAGNOSIS — F329 Major depressive disorder, single episode, unspecified: Secondary | ICD-10-CM | POA: Diagnosis present

## 2016-10-28 DIAGNOSIS — R071 Chest pain on breathing: Secondary | ICD-10-CM | POA: Diagnosis not present

## 2016-10-28 DIAGNOSIS — E8770 Fluid overload, unspecified: Secondary | ICD-10-CM | POA: Diagnosis not present

## 2016-10-28 DIAGNOSIS — R296 Repeated falls: Secondary | ICD-10-CM | POA: Diagnosis present

## 2016-10-28 DIAGNOSIS — R0603 Acute respiratory distress: Secondary | ICD-10-CM

## 2016-10-28 DIAGNOSIS — R0600 Dyspnea, unspecified: Secondary | ICD-10-CM | POA: Diagnosis not present

## 2016-10-28 DIAGNOSIS — I70203 Unspecified atherosclerosis of native arteries of extremities, bilateral legs: Secondary | ICD-10-CM | POA: Diagnosis not present

## 2016-10-28 DIAGNOSIS — I779 Disorder of arteries and arterioles, unspecified: Secondary | ICD-10-CM | POA: Diagnosis not present

## 2016-10-28 DIAGNOSIS — D638 Anemia in other chronic diseases classified elsewhere: Secondary | ICD-10-CM | POA: Diagnosis not present

## 2016-10-28 DIAGNOSIS — I5032 Chronic diastolic (congestive) heart failure: Secondary | ICD-10-CM | POA: Diagnosis present

## 2016-10-28 DIAGNOSIS — R7989 Other specified abnormal findings of blood chemistry: Secondary | ICD-10-CM

## 2016-10-28 DIAGNOSIS — F419 Anxiety disorder, unspecified: Secondary | ICD-10-CM | POA: Diagnosis present

## 2016-10-28 DIAGNOSIS — K922 Gastrointestinal hemorrhage, unspecified: Secondary | ICD-10-CM | POA: Diagnosis present

## 2016-10-28 DIAGNOSIS — I2510 Atherosclerotic heart disease of native coronary artery without angina pectoris: Secondary | ICD-10-CM | POA: Diagnosis present

## 2016-10-28 DIAGNOSIS — D62 Acute posthemorrhagic anemia: Secondary | ICD-10-CM | POA: Diagnosis not present

## 2016-10-28 DIAGNOSIS — D631 Anemia in chronic kidney disease: Secondary | ICD-10-CM | POA: Diagnosis present

## 2016-10-28 DIAGNOSIS — Z8673 Personal history of transient ischemic attack (TIA), and cerebral infarction without residual deficits: Secondary | ICD-10-CM | POA: Diagnosis not present

## 2016-10-28 DIAGNOSIS — Z79899 Other long term (current) drug therapy: Secondary | ICD-10-CM

## 2016-10-28 DIAGNOSIS — R0789 Other chest pain: Secondary | ICD-10-CM | POA: Diagnosis not present

## 2016-10-28 DIAGNOSIS — Z7982 Long term (current) use of aspirin: Secondary | ICD-10-CM | POA: Diagnosis not present

## 2016-10-28 DIAGNOSIS — R609 Edema, unspecified: Secondary | ICD-10-CM | POA: Diagnosis not present

## 2016-10-28 DIAGNOSIS — K921 Melena: Secondary | ICD-10-CM | POA: Diagnosis not present

## 2016-10-28 DIAGNOSIS — N186 End stage renal disease: Secondary | ICD-10-CM | POA: Diagnosis not present

## 2016-10-28 DIAGNOSIS — I132 Hypertensive heart and chronic kidney disease with heart failure and with stage 5 chronic kidney disease, or end stage renal disease: Principal | ICD-10-CM | POA: Diagnosis present

## 2016-10-28 DIAGNOSIS — G629 Polyneuropathy, unspecified: Secondary | ICD-10-CM | POA: Diagnosis present

## 2016-10-28 DIAGNOSIS — D649 Anemia, unspecified: Secondary | ICD-10-CM | POA: Diagnosis present

## 2016-10-28 DIAGNOSIS — M1990 Unspecified osteoarthritis, unspecified site: Secondary | ICD-10-CM | POA: Diagnosis present

## 2016-10-28 DIAGNOSIS — I120 Hypertensive chronic kidney disease with stage 5 chronic kidney disease or end stage renal disease: Secondary | ICD-10-CM | POA: Diagnosis not present

## 2016-10-28 LAB — CBC WITH DIFFERENTIAL/PLATELET
BASOS ABS: 0 10*3/uL (ref 0.0–0.1)
BASOS PCT: 0 %
EOS ABS: 0 10*3/uL (ref 0.0–0.7)
Eosinophils Relative: 0 %
HCT: 23.8 % — ABNORMAL LOW (ref 36.0–46.0)
HEMOGLOBIN: 7.6 g/dL — AB (ref 12.0–15.0)
Lymphocytes Relative: 10 %
Lymphs Abs: 1.1 10*3/uL (ref 0.7–4.0)
MCH: 32.6 pg (ref 26.0–34.0)
MCHC: 31.9 g/dL (ref 30.0–36.0)
MCV: 102.1 fL — ABNORMAL HIGH (ref 78.0–100.0)
MONOS PCT: 4 %
Monocytes Absolute: 0.4 10*3/uL (ref 0.1–1.0)
NEUTROS ABS: 8.6 10*3/uL — AB (ref 1.7–7.7)
NEUTROS PCT: 86 %
Platelets: 293 10*3/uL (ref 150–400)
RBC: 2.33 MIL/uL — ABNORMAL LOW (ref 3.87–5.11)
RDW: 15.5 % (ref 11.5–15.5)
WBC: 10.2 10*3/uL (ref 4.0–10.5)

## 2016-10-28 LAB — COMPREHENSIVE METABOLIC PANEL
ALK PHOS: 37 U/L — AB (ref 38–126)
ALT: 15 U/L (ref 14–54)
ANION GAP: 10 (ref 5–15)
AST: 24 U/L (ref 15–41)
Albumin: 3.1 g/dL — ABNORMAL LOW (ref 3.5–5.0)
BILIRUBIN TOTAL: 1.1 mg/dL (ref 0.3–1.2)
BUN: 114 mg/dL — ABNORMAL HIGH (ref 6–20)
CALCIUM: 8.4 mg/dL — AB (ref 8.9–10.3)
CO2: 25 mmol/L (ref 22–32)
CREATININE: 4.61 mg/dL — AB (ref 0.44–1.00)
Chloride: 99 mmol/L — ABNORMAL LOW (ref 101–111)
GFR, EST AFRICAN AMERICAN: 10 mL/min — AB (ref >60)
GFR, EST NON AFRICAN AMERICAN: 8 mL/min — AB (ref >60)
Glucose, Bld: 129 mg/dL — ABNORMAL HIGH (ref 65–99)
Potassium: 5 mmol/L (ref 3.5–5.1)
Sodium: 134 mmol/L — ABNORMAL LOW (ref 135–145)
TOTAL PROTEIN: 6.3 g/dL — AB (ref 6.5–8.1)

## 2016-10-28 LAB — URINALYSIS, ROUTINE W REFLEX MICROSCOPIC
BILIRUBIN URINE: NEGATIVE
GLUCOSE, UA: NEGATIVE mg/dL
Hgb urine dipstick: NEGATIVE
KETONES UR: NEGATIVE mg/dL
Leukocytes, UA: NEGATIVE
NITRITE: NEGATIVE
PH: 6.5 (ref 5.0–8.0)
PROTEIN: 30 mg/dL — AB
Specific Gravity, Urine: 1.011 (ref 1.005–1.030)

## 2016-10-28 LAB — URINALYSIS, MICROSCOPIC (REFLEX): WBC, UA: NONE SEEN WBC/hpf (ref 0–5)

## 2016-10-28 LAB — TROPONIN I
TROPONIN I: 0.04 ng/mL — AB (ref <0.03)
TROPONIN I: 0.06 ng/mL — AB (ref <0.03)

## 2016-10-28 LAB — PROTIME-INR
INR: 1.35
PROTHROMBIN TIME: 16.8 s — AB (ref 11.4–15.2)

## 2016-10-28 LAB — BRAIN NATRIURETIC PEPTIDE: B Natriuretic Peptide: 1384.6 pg/mL — ABNORMAL HIGH (ref 0.0–100.0)

## 2016-10-28 MED ORDER — ACETAMINOPHEN 650 MG RE SUPP: 650.0000 mg | Freq: Four times a day (QID) | RECTAL | Status: DC | PRN

## 2016-10-28 MED ORDER — VITAMIN B-12 100 MCG PO TABS
100.0000 ug | ORAL_TABLET | Freq: Every day | ORAL | Status: DC
  Administered 2016-10-31 – 2016-11-07 (×8): 100 ug via ORAL
  Filled 2016-10-28 (×9): qty 1

## 2016-10-28 MED ORDER — LORAZEPAM 2 MG/ML IJ SOLN
0.5000 mg | Freq: Once | INTRAMUSCULAR | Status: AC
  Administered 2016-10-28: 0.5 mg via INTRAVENOUS
  Filled 2016-10-28: qty 1

## 2016-10-28 MED ORDER — AMLODIPINE BESYLATE 10 MG PO TABS
10.0000 mg | ORAL_TABLET | Freq: Every day | ORAL | Status: DC
  Administered 2016-10-29 – 2016-11-06 (×9): 10 mg via ORAL
  Filled 2016-10-28 (×9): qty 1

## 2016-10-28 MED ORDER — FUROSEMIDE 80 MG PO TABS
80.0000 mg | ORAL_TABLET | Freq: Every evening | ORAL | Status: DC
  Administered 2016-10-29 – 2016-10-30 (×2): 80 mg via ORAL
  Filled 2016-10-28 (×2): qty 1

## 2016-10-28 MED ORDER — LOSARTAN POTASSIUM 50 MG PO TABS
50.0000 mg | ORAL_TABLET | Freq: Every day | ORAL | Status: DC
  Administered 2016-10-28 – 2016-11-06 (×10): 50 mg via ORAL
  Filled 2016-10-28 (×10): qty 1

## 2016-10-28 MED ORDER — ACETAMINOPHEN 325 MG PO TABS
650.0000 mg | ORAL_TABLET | Freq: Four times a day (QID) | ORAL | Status: DC | PRN
  Administered 2016-11-01 – 2016-11-07 (×9): 650 mg via ORAL
  Filled 2016-10-28 (×10): qty 2

## 2016-10-28 MED ORDER — LEVOTHYROXINE SODIUM 112 MCG PO TABS
112.0000 ug | ORAL_TABLET | Freq: Every day | ORAL | Status: DC
  Administered 2016-10-29 – 2016-11-07 (×7): 112 ug via ORAL
  Filled 2016-10-28 (×7): qty 1

## 2016-10-28 MED ORDER — HEPARIN SODIUM (PORCINE) 5000 UNIT/ML IJ SOLN: 5000.0000 [IU] | Freq: Three times a day (TID) | INTRAMUSCULAR | Status: DC

## 2016-10-28 MED ORDER — ZINC OXIDE 11.3 % EX CREA
TOPICAL_CREAM | CUTANEOUS | Status: AC
  Filled 2016-10-28: qty 56

## 2016-10-28 MED ORDER — ALBUTEROL SULFATE (2.5 MG/3ML) 0.083% IN NEBU: 2.5000 mg | INHALATION_SOLUTION | RESPIRATORY_TRACT | Status: DC | PRN

## 2016-10-28 MED ORDER — FUROSEMIDE 80 MG PO TABS
120.0000 mg | ORAL_TABLET | Freq: Every day | ORAL | Status: DC
Start: 2016-10-29 — End: 2016-10-31
  Administered 2016-10-29 – 2016-10-31 (×3): 120 mg via ORAL
  Filled 2016-10-28 (×2): qty 1

## 2016-10-28 MED ORDER — VITAMIN D 1000 UNITS PO TABS
2000.0000 [IU] | ORAL_TABLET | Freq: Every day | ORAL | Status: DC
  Administered 2016-10-28 – 2016-11-07 (×9): 2000 [IU] via ORAL
  Filled 2016-10-28 (×10): qty 2

## 2016-10-28 MED ORDER — ZINC OXIDE 11.3 % EX CREA
TOPICAL_CREAM | Freq: Two times a day (BID) | CUTANEOUS | Status: DC
  Administered 2016-10-28 – 2016-10-30 (×3): via TOPICAL
  Administered 2016-10-30: 1 via TOPICAL
  Administered 2016-10-31 – 2016-11-05 (×6): via TOPICAL
  Filled 2016-10-28: qty 56

## 2016-10-28 MED ORDER — LORAZEPAM 2 MG/ML IJ SOLN
0.5000 mg | Freq: Once | INTRAMUSCULAR | Status: AC
  Administered 2016-10-28: 0.5 mg via INTRAVENOUS

## 2016-10-28 MED ORDER — ASPIRIN 325 MG PO TABS
325.0000 mg | ORAL_TABLET | Freq: Every day | ORAL | Status: DC
  Administered 2016-10-28: 325 mg via ORAL
  Filled 2016-10-28 (×2): qty 1

## 2016-10-28 MED ORDER — FUROSEMIDE 10 MG/ML IJ SOLN
60.0000 mg | Freq: Once | INTRAMUSCULAR | Status: AC
  Administered 2016-10-28: 60 mg via INTRAVENOUS
  Filled 2016-10-28: qty 6

## 2016-10-28 MED ORDER — LORAZEPAM 2 MG/ML IJ SOLN
INTRAMUSCULAR | Status: AC
  Filled 2016-10-28: qty 1

## 2016-10-28 MED ORDER — GABAPENTIN 300 MG PO CAPS
300.0000 mg | ORAL_CAPSULE | Freq: Every day | ORAL | Status: DC
Start: 2016-10-28 — End: 2016-11-07
  Administered 2016-10-28 – 2016-11-07 (×11): 300 mg via ORAL
  Filled 2016-10-28 (×11): qty 1

## 2016-10-28 NOTE — H&P (Addendum)
History and Physical    Jill Johnson:[id number] DOB: [birth year] DOA: 10/28/2016  Referring MD/NP/PA:  PCP: Hollice Espy, MD  Patient coming from: [facility] Chief Complaint: shortness of breath  HPI: Jill Johnson is a 75 y.o. female with medical history significant of HTN, HLD, CKD, CAD s/p CABG, AVR, anemia, depression, hypothyroidism; who presents with complaints of shortness of breath. History is obtained by the patient's son as the patient is currently upset and unable to gather herself to provide adequate history. Patient had just recently been seen by her nephrologist Dr. Marisue Humble yesterday, and was told that her fistula was ready to be used and that it was time to start dialysis. However, the patient wanted to wait until after Christmas. Since that time the patient has had complaints of intermittent chest pain and been very anxious stating that she cannot breathe. She describes the chest pain more so as pressure and tightness. Associated symptoms included increasing swelling in her bilateral lower extremities, more depressed, and easily fatigued. Patient has had no loss of consciousness, focal weakness, changes in vision, abdominal pain, nausea, vomiting, or fever. At this time she still makes urine, but the son is unsure if she took her Lasix this morning.  ED Course: Upon admission to emergency department patient was evaluated and seen to be afebrile with respirations up to 23, O2 saturations as low as 90% on room air, and all other vitals are relatively within normal limits. Lab work revealed WBC 10.2, hemoglobin 7.6, platelets 293, sodium 134, potassium 5, chloride 99, CO2 25, BUN 114, creatinine 4.61, BNP 1384.6, troponin 0.04. Dr. Juel Burrow of Nephrology was consulted. The patient was transferred to a stepdown bed.  Review of Systems: As per HPI otherwise 10 point review of systems negative.   Past Medical History:  Diagnosis Date  . Anemia   . CAD (coronary artery disease)   . CFS  (chronic fatigue syndrome)   . Chronic rhinitis   . Complication of anesthesia    "a little hard waking up"  . Constipation    due to iron  . Coronary atherosclerosis   . Depression   . Difficult intubation    "I have a small opening" When she had her CABG   . DJD (degenerative joint disease)   . Hyperlipidemia   . Hypertension   . Hypothyroidism   . Kidney stone   . MDD (major depressive disorder)   . Neuropathy (HCC)    in legs  . Obesity   . Osteopenia   . Renal insufficiency    Post Op  . Stroke [facility])    TIA, several    Past Surgical History:  Procedure Laterality Date  . ABDOMINAL HYSTERECTOMY  1976  . AORTIC VALVE REPLACEMENT    . AV FISTULA PLACEMENT Left 08/07/2016   Procedure: CREATION OF  LEFT UPPER ARM ARTERIOVENOUS (AV) FISTULA;  Surgeon: Larina Earthly, MD;  Location: [facility] OR;  Service: Vascular;  Laterality: Left;  . BACK SURGERY  1980's  . CARDIAC VALVE REPLACEMENT    . CORONARY ARTERY BYPASS GRAFT    . KNEE ARTHROSCOPY       reports that she has never smoked. She has never used smokeless tobacco. She reports that she does not drink alcohol or use drugs.  Allergies  Allergen Reactions  . Prednisone Other (See Comments)    Makes her loony    Family History  Problem Relation Age of Onset  . Alzheimer's disease Mother   . Aortic stenosis  Father   . Heart disease Father   . Heart disease Son     Prior to Admission medications   Medication Sig Start Date End Date Taking? Authorizing Provider  acetaminophen (TYLENOL) 650 MG CR tablet Take 650 mg by mouth 2 (two) times daily.     Historical Provider, MD  amLODipine (NORVASC) 10 MG tablet Take 1 tablet (10 mg total) by mouth daily. 05/10/14   Jake BatheMark C Skains, MD  aspirin 325 MG tablet Take 325 mg by mouth daily.    Historical Provider, MD  cholecalciferol (VITAMIN D) 1000 units tablet Take 2,000 Units by mouth daily.     Historical Provider, MD  Cyanocobalamin (VITAMIN B-12 PO) Take 1 tablet by mouth daily.     Historical Provider, MD  furosemide (LASIX) 40 MG tablet Take 160 mg by mouth daily.     Historical Provider, MD  gabapentin (NEURONTIN) 300 MG capsule Take 300 mg by mouth daily.  03/27/16   Historical Provider, MD  levothyroxine (SYNTHROID, LEVOTHROID) 112 MCG tablet Take 112 mcg by mouth daily before breakfast.  12/11/15   Historical Provider, MD  losartan (COZAAR) 25 MG tablet Take 50 mg by mouth at bedtime.  01/29/16   Historical Provider, MD  Magnesium Hydroxide (MAGNESIA PO) Take 1 tablet by mouth at bedtime.     Historical Provider, MD  UNABLE TO FIND Iron Infusion once a month    Historical Provider, MD    Physical Exam:    Constitutional: Elderly female who appears in moderate distress able to get comfortable. Vitals:   10/28/16 1642 10/28/16 1748 10/28/16 1750 10/28/16 1853  BP: 144/63 182/60 (!) 186/50 (!) 154/84  Pulse: 74 74 72 77  Resp: 18 23 22 20   Temp:    98.3 F (36.8 C)  TempSrc:    Oral  SpO2: 90% 92% 93% 96%  Weight:      Height:       Eyes: PERRL, lids and conjunctivae normal ENMT: Mucous membranes are moist. Posterior pharynx clear of any exudate or lesions..  Neck: normal, supple, no masses, no thyromegaly, JVD present Respiratory: Tachypneic with bilateral crackles appreciated, but no wheezes or rhonchi. Patient talking in short sentences. Cardiovascular: Regular rate and rhythm, +SEM. No rubs / gallops. 2+ pitting lower extremity edema. 1+ pedal pulses. No carotid bruits. Left upper arm fistula with palpable thrill. Abdomen: no tenderness, no masses palpated. No hepatosplenomegaly. Bowel sounds positive.  Musculoskeletal:  No joint deformity upper and lower extremities. Good ROM, no contractures. Normal muscle tone.  Skin: Scabbed over lesions of the left foot noted with bluish discoloration of the ends of the toes Neurologic: CN 2-12 grossly intact. Sensation intact, DTR normal. Strength 5/5 in all 4.  Psychiatric: Normal judgment and insight. Alert and  oriented x 3. Very anxious l mood.     Labs on Admission: I have personally reviewed following labs and imaging studies  CBC:  Recent Labs Lab 10/28/16 1545  WBC 10.2  NEUTROABS 8.6*  HGB 7.6*  HCT 23.8*  MCV 102.1*  PLT 293   Basic Metabolic Panel:  Recent Labs Lab 10/28/16 1545  NA 134*  K 5.0  CL 99*  CO2 25  GLUCOSE 129*  BUN 114*  CREATININE 4.61*  CALCIUM 8.4*   GFR: Estimated Creatinine Clearance: 9.2 mL/min (by C-G formula based on SCr of 4.61 mg/dL (H)). Liver Function Tests:  Recent Labs Lab 10/28/16 1545  AST 24  ALT 15  ALKPHOS 37*  BILITOT 1.1  PROT  6.3*  ALBUMIN 3.1*   No results for input(s): LIPASE, AMYLASE in the last 168 hours. No results for input(s): AMMONIA in the last 168 hours. Coagulation Profile:  Recent Labs Lab 10/28/16 1545  INR 1.35   Cardiac Enzymes:  Recent Labs Lab 10/28/16 1545  TROPONINI 0.04*   BNP (last 3 results) No results for input(s): PROBNP in the last 8760 hours. HbA1C: No results for input(s): HGBA1C in the last 72 hours. CBG: No results for input(s): GLUCAP in the last 168 hours. Lipid Profile: No results for input(s): CHOL, HDL, LDLCALC, TRIG, CHOLHDL, LDLDIRECT in the last 72 hours. Thyroid Function Tests: No results for input(s): TSH, T4TOTAL, FREET4, T3FREE, THYROIDAB in the last 72 hours. Anemia Panel: No results for input(s): VITAMINB12, FOLATE, FERRITIN, TIBC, IRON, RETICCTPCT in the last 72 hours. Urine analysis:    Component Value Date/Time   COLORURINE YELLOW 10/28/2016 1600   APPEARANCEUR CLEAR 10/28/2016 1600   LABSPEC 1.011 10/28/2016 1600   PHURINE 6.5 10/28/2016 1600   GLUCOSEU NEGATIVE 10/28/2016 1600   HGBUR NEGATIVE 10/28/2016 1600   BILIRUBINUR NEGATIVE 10/28/2016 1600   KETONESUR NEGATIVE 10/28/2016 1600   PROTEINUR 30 (A) 10/28/2016 1600   UROBILINOGEN 1.0 01/30/2010 1104   NITRITE NEGATIVE 10/28/2016 1600   LEUKOCYTESUR NEGATIVE 10/28/2016 1600   Sepsis Labs: No  results found for this or any previous visit (from the past 240 hour(s)).   Radiological Exams on Admission: Dg Chest 2 View  Result Date: 10/28/2016 CLINICAL DATA:  Chest pain, anxiety, history coronary disease post CABG, hypertension, stroke, acute diastolic heart failure, prior AVR EXAM: CHEST  2 VIEW COMPARISON:  11/13/2015 FINDINGS: Enlargement of cardiac silhouette post CABG and AVR. Pulmonary vascular congestion. BILATERAL pulmonary infiltrates favoring pulmonary edema and CHF. Atherosclerotic calcification aorta. Rotation to the LEFT on the AP view. No gross pleural effusion or pneumothorax. Bones demineralized. IMPRESSION: Probable CHF. Post CABG and AVR. Aortic atherosclerosis. Electronically Signed   By: Ulyses SouthwardMark  Boles M.D.   On: 10/28/2016 16:40    EKG: Independently reviewed.Sinus rhythm with a prolonged PR interval  Assessment/Plan Acute respiratory distress/ Fluid overload 2/2 ESRD: Acute. Patient presents with complaints of shortness of breath. Crackles noted bilaterally on physical exam. Lab work shows elevated BNP of >1000, BUN 114, and Cr 4.61 .Chest x-ray showing probable CHF. Nephrology consulted to possibly dialyze patient this evening. - Admit to stepdown - Continuous pulse oximetry with nasal cannula oxygen to keep O2 sats greater than 90% - Lasix IV 60 mg x 1 dose now as patient still makes urine - Continue home oral Lasix regimen tomorrow a.m. - albuterol prn SOB/Wheezing - Appreciate nephrology consultative services, will follow up with further recommendations - Hemodialysis per nephrology    Chest pain with elevated troponin: Suspected patient's initial troponin of 0.04 is likely related to demand ischemia. - Trend troponins  Anemia of chronic disease: Hemoglobin on admission 7.6. Baseline hemoglobin appears to have been between 8 and 9. patient on iron infusions and vitamin B12  - Type and screen for possible need of blood transfusion but will not transfuse  currently as risks of further fluid overload - Continue vitamin B12 supplement  - Per nephrology to determine if transfusion to be given during hemodialysis  Anxiety/depression: Acute on chronic. Patient appears acutely distressed due to shortness of breath symptoms. Given Ativan 2 prior to transport of 0.5 mg IV with some ease and anxiety symptoms. -  Ativan IV 0.5 1 dose now  - Patient may be of benefit  from being on SSRI   Coronary artery disease s/p CABG/AVR - Continue aspirin  Essential hypertension - Continue Cozaar and amlodipine  Peripheral neuropathy:Suspect some aspect of peripheral vascular disease due to decreased circulation of lower extremities.  - Continue gabapentin - ABI  and/or VVS consult recommended per nephrology   Hypothyroidism - Continue levothyroxine  DVT prophylaxis: Heparin Code Status: Full Family Communication: Discussed plan of care with the patient and her son was present at bedside  Disposition Plan: Likely discharge home once medically stable  Consults called: Nephrology Dr. Juel Burrow  Admission status: Inpatient  Clydie Braun MD [facility] Pager [phone]  If 7PM-7AM, please contact night-coverage [email or web address] Password [email or web address]  10/28/2016, 8:12 PM

## 2016-10-28 NOTE — Progress Notes (Signed)
Patient is in camera room. Patient and family aware. Explained that they are not being recorded but rather it's a safety purpose. Family expressed no concern and were agreeable with that. No further questions.

## 2016-10-28 NOTE — ED Notes (Signed)
Patient transported to X-ray 

## 2016-10-28 NOTE — Progress Notes (Signed)
[facility]. Patient presented with shortness of breath, in need of dialysis. ED doctor notified nephrology on call. Patient with known history of chronic kidney disease but has not received hemodialysis at this point. Plan to admit to step down unit. Please notify nephrologist once patient arrives to the unit.  Jill PrestoMAGICK-Covey Baller, MD  [facility] Pager [phone]  If 7PM-7AM, please contact night-coverage [email or web address] Password [email or web address]

## 2016-10-28 NOTE — ED Triage Notes (Signed)
Pt c/o SOb and "panic attack " x 1 day, pt tearful in triage

## 2016-10-28 NOTE — ED Provider Notes (Signed)
[facility] Provider Note   CSN: [id number] Arrival date & time: 10/28/16  1427     History   Chief Complaint Chief Complaint  Patient presents with  . Shortness of Breath    anxiety    HPI LANICE FOLDEN is a 75 y.o. female.  HPI   Patient is a 75 year old female with past history significant for CABG, CK D, stroke, plan to start dialysis this week. Patient went to renal physician, Dr. Verna Czech yesterday and was told to change it to start dialysis. Patient was upset and has had intermittent chest pain since then. Patient's son thinks it may be due to anxiety. However she's been having exertional chest pain and shortness of breath. Patient has also noticed increasing edema in her bilateral legs.  Patient states "I don't want to come in the hospital around Christmas".  Chest pain does not radiate. Associated with shortness of breath. Not associated with diaphoresis.  Past Medical History:  Diagnosis Date  . Anemia   . CAD (coronary artery disease)   . CFS (chronic fatigue syndrome)   . Chronic rhinitis   . Complication of anesthesia    "a little hard waking up"  . Constipation    due to iron  . Coronary atherosclerosis   . Depression   . Difficult intubation    "I have a small opening" When she had her CABG   . DJD (degenerative joint disease)   . Hyperlipidemia   . Hypertension   . Hypothyroidism   . Kidney stone   . MDD (major depressive disorder)   . Neuropathy (HCC)    in legs  . Obesity   . Osteopenia   . Renal insufficiency    Post Op  . Stroke [facility])    TIA, several    Patient Active Problem List   Diagnosis Date Noted  . Chest pain 10/28/2016  . Fluid overload 10/28/2016  . Elevated troponin 10/28/2016  . Anemia of chronic disease 10/28/2016  . ESRD (end stage renal disease) (HCC) 10/28/2016  . Hypothyroidism 10/28/2016  . Respiratory distress 10/28/2016  . Acute diastolic heart failure (HCC) 12/20/2015  . Obesity 12/20/2015  .  Coronary artery disease due to lipid rich plaque 12/20/2015  . S/P AVR (aortic valve replacement) 12/20/2015  . Chronic kidney disease, stage 3 12/20/2015    Past Surgical History:  Procedure Laterality Date  . ABDOMINAL HYSTERECTOMY  1976  . AORTIC VALVE REPLACEMENT    . AV FISTULA PLACEMENT Left 08/07/2016   Procedure: CREATION OF  LEFT UPPER ARM ARTERIOVENOUS (AV) FISTULA;  Surgeon: Larina Earthly, MD;  Location: [facility] OR;  Service: Vascular;  Laterality: Left;  . BACK SURGERY  1980's  . CARDIAC VALVE REPLACEMENT    . CORONARY ARTERY BYPASS GRAFT    . KNEE ARTHROSCOPY      OB History    No data available       Home Medications    Prior to Admission medications   Medication Sig Start Date End Date Taking? Authorizing Provider  acetaminophen (TYLENOL) 650 MG CR tablet Take 650 mg by mouth 2 (two) times daily.     Historical Provider, MD  amLODipine (NORVASC) 10 MG tablet Take 1 tablet (10 mg total) by mouth daily. 05/10/14   [name], MD  aspirin 325 MG tablet Take 325 mg by mouth daily.    Historical Provider, MD  cholecalciferol (VITAMIN D) 1000 units tablet Take 2,000 Units by mouth daily.  Historical Provider, MD  Cyanocobalamin (VITAMIN B-12 PO) Take 1 tablet by mouth daily.    Historical Provider, MD  furosemide (LASIX) 40 MG tablet Take 160 mg by mouth daily.     Historical Provider, MD  gabapentin (NEURONTIN) 300 MG capsule Take 300 mg by mouth daily.  03/27/16   Historical Provider, MD  levothyroxine (SYNTHROID, LEVOTHROID) 112 MCG tablet Take 112 mcg by mouth daily before breakfast.  12/11/15   Historical Provider, MD  losartan (COZAAR) 25 MG tablet Take 50 mg by mouth at bedtime.  01/29/16   Historical Provider, MD  Magnesium Hydroxide (MAGNESIA PO) Take 1 tablet by mouth at bedtime.     Historical Provider, MD  UNABLE TO FIND Iron Infusion once a month    Historical Provider, MD    Family History Family History  Problem Relation Age of Onset  . Alzheimer's disease  Mother   . Aortic stenosis Father   . Heart disease Father   . Heart disease Son     Social History Social History  Substance Use Topics  . Smoking status: Never Smoker  . Smokeless tobacco: Never Used  . Alcohol use No     Allergies   Prednisone   Review of Systems Review of Systems  Constitutional: Positive for fatigue. Negative for fever.  Respiratory: Positive for shortness of breath.   Cardiovascular: Positive for chest pain and leg swelling.     Physical Exam Updated Vital Signs BP (!) 154/84 (BP Location: Right Arm)   Pulse 77   Temp 98.2 F (36.8 C) (Oral)   Resp 18   Ht 5' (1.524 m)   Wt 163 lb (73.9 kg)   SpO2 96%   BMI 31.83 kg/m   Physical Exam  Constitutional: She is oriented to person, place, and time. She appears well-developed and well-nourished.  HENT:  Head: Normocephalic and atraumatic.  Eyes: Right eye exhibits no discharge.  Cardiovascular: Normal rate, regular rhythm and normal heart sounds.   No murmur heard. Sternal scar  Pulmonary/Chest: Effort normal and breath sounds normal. She has no wheezes. She has no rales.  + small crackles  Abdominal: Soft. She exhibits no distension. There is no tenderness.  Musculoskeletal: She exhibits edema.  +3 pitting edema bilateral lower extremities.  Neurological: She is oriented to person, place, and time. No cranial nerve deficit.  Skin: Skin is warm and dry. She is not diaphoretic.  Psychiatric: She has a normal mood and affect.  Nursing note and vitals reviewed.    ED Treatments / Results  Labs (all labs ordered are listed, but only abnormal results are displayed) Labs Reviewed  COMPREHENSIVE METABOLIC PANEL - Abnormal; Notable for the following:       Result Value   Sodium 134 (*)    Chloride 99 (*)    Glucose, Bld 129 (*)    BUN 114 (*)    Creatinine, Ser 4.61 (*)    Calcium 8.4 (*)    Total Protein 6.3 (*)    Albumin 3.1 (*)    Alkaline Phosphatase 37 (*)    GFR calc non Af  Amer 8 (*)    GFR calc Af Amer 10 (*)    All other components within normal limits  CBC WITH DIFFERENTIAL/PLATELET - Abnormal; Notable for the following:    RBC 2.33 (*)    Hemoglobin 7.6 (*)    HCT 23.8 (*)    MCV 102.1 (*)    Neutro Abs 8.6 (*)    All other  components within normal limits  TROPONIN I - Abnormal; Notable for the following:    Troponin I 0.04 (*)    All other components within normal limits  PROTIME-INR - Abnormal; Notable for the following:    Prothrombin Time 16.8 (*)    All other components within normal limits  URINALYSIS, ROUTINE W REFLEX MICROSCOPIC - Abnormal; Notable for the following:    Protein, ur 30 (*)    All other components within normal limits  BRAIN NATRIURETIC PEPTIDE - Abnormal; Notable for the following:    B Natriuretic Peptide 1,384.6 (*)    All other components within normal limits  URINALYSIS, MICROSCOPIC (REFLEX) - Abnormal; Notable for the following:    Bacteria, UA RARE (*)    Squamous Epithelial / LPF 0-5 (*)    All other components within normal limits  URINE CULTURE  MRSA PCR SCREENING  CBC  RENAL FUNCTION PANEL  TROPONIN I  TROPONIN I  TROPONIN I  MAGNESIUM  PARATHYROID HORMONE, INTACT (NO CA)  TYPE AND SCREEN    EKG  EKG Interpretation  Date/Time:  Wednesday October 28 2016 15:44:08 EST Ventricular Rate:  70 PR Interval:    QRS Duration: 116 QT Interval:  407 QTC Calculation: 440 R Axis:   63 Text Interpretation:  Sinus rhythm Nonspecific intraventricular conduction delay Normal sinus rhythm Confirmed by Kandis Mannan ([id number]) on 10/28/2016 3:47:01 PM       Radiology Dg Chest 2 View  Result Date: 10/28/2016 CLINICAL DATA:  Chest pain, anxiety, history coronary disease post CABG, hypertension, stroke, acute diastolic heart failure, prior AVR EXAM: CHEST  2 VIEW COMPARISON:  11/13/2015 FINDINGS: Enlargement of cardiac silhouette post CABG and AVR. Pulmonary vascular congestion. BILATERAL pulmonary infiltrates  favoring pulmonary edema and CHF. Atherosclerotic calcification aorta. Rotation to the LEFT on the AP view. No gross pleural effusion or pneumothorax. Bones demineralized. IMPRESSION: Probable CHF. Post CABG and AVR. Aortic atherosclerosis. Electronically Signed   By: Ulyses Southward M.D.   On: 10/28/2016 16:40    Procedures Procedures (including critical care time)  Medications Ordered in ED Medications  zinc oxide (BALMEX) 11.3 % cream ( Topical Given 10/28/16 1747)  levothyroxine (SYNTHROID, LEVOTHROID) tablet 112 mcg (not administered)  losartan (COZAAR) tablet 50 mg (not administered)  amLODipine (NORVASC) tablet 10 mg (not administered)  aspirin tablet 325 mg (not administered)  heparin injection 5,000 Units (not administered)  acetaminophen (TYLENOL) tablet 650 mg (not administered)    Or  acetaminophen (TYLENOL) suppository 650 mg (not administered)  albuterol (PROVENTIL) (2.5 MG/3ML) 0.083% nebulizer solution 2.5 mg (not administered)  vitamin B-12 (CYANOCOBALAMIN) tablet 100 mcg (not administered)  cholecalciferol (VITAMIN D) tablet 2,000 Units (not administered)  gabapentin (NEURONTIN) capsule 300 mg (not administered)  furosemide (LASIX) tablet 120 mg (not administered)  furosemide (LASIX) tablet 80 mg (not administered)  LORazepam (ATIVAN) injection 0.5 mg ( Intravenous Canceled Entry 10/28/16 1615)  LORazepam (ATIVAN) injection 0.5 mg (0.5 mg Intravenous Given 10/28/16 1746)  LORazepam (ATIVAN) injection 0.5 mg (0.5 mg Intravenous Given 10/28/16 2022)  furosemide (LASIX) injection 60 mg (60 mg Intravenous Given 10/28/16 2141)     Initial Impression / Assessment and Plan / ED Course  I have reviewed the triage vital signs and the nursing notes.  Pertinent labs & imaging results that were available during my care of the patient were reviewed by me and considered in my medical decision making (see chart for details).  Clinical Course     Patient is a 75 year old female  with  CABG, CK D, plain start dialysis this week. She is presenting with intermittent shortness of breath and chest pain. Concern that fluid overload may be contributing to her shortness of breath. Also concerned about chest pain given her past medical history significant for CABG. We'll get EKG, troponin, chest x-ray.  I think patient will likely require admission given her elevated heart score, chest pain complaints. In addition given her likely fluid overload in the next short period of time giving her increasing edema and need for dailysis.  Elevated trop, fluid on Xray, elevated BNP. Discussed with Dr. Tristan SchroederLien on for nephrology and Dr. Izola PriceMyers hospitlist for admission.  Final Clinical Impressions(s) / ED Diagnoses   Final diagnoses:  Chest pain, unspecified type    New Prescriptions Current Discharge Medication List       Bach Rocchi Randall AnLyn Jaivian Battaglini, MD 10/28/16 2209

## 2016-10-28 NOTE — Consult Note (Addendum)
Reason for Consult: CKDV/ESRD  Referring Physician: Dr. Fuller Plan  Chief Complaint: Dyspnea  Assessment/Plan: 1 Dyspnea - Lasix was increased to '120mg'$ /'80mg'$  AM/PM by Dr. Joelyn Oms yesterday and per son her urine output is decent but she is using diapers/bedpan. Son has noticed that the lower extremity swelling has been worsening over the past few weeks. CXR in ED shows pulmonary congestion. - She has progressed to ESRD and would benefit from ultrafiltration to manage her hypervolemia.  - May continue Lasix for now more for venodilation to help manage the dyspnea and per son she still has decent UOP. 2 ESRD: Will initiate dialysis but will first ask [facility] to give the OK to use the fistula. 12/29 will be the 3 mth mark since access placement by Dr. Donnetta Hutching. There is a strong bruit in this left BCF and augmentation is decent. Her skin is on the thin side and fistula also may be on the deeper side. There is also mild tortuosity to the fistula; should be ok to initiate but would like a 2nd opinion by [facility]. 3 Hypertension: Resume home anti-hypertensives. UF will help manage this as I suspect UOP is not as great as the son believes. Difficult to quantitate as weights are not being taken consistently and the pt usually uses diapers. 4. Anemia of ESRD: progression as she was off ESA for 26mhs. Will given IV iron on dialysis. TSAT is only 14% with Ferritin of 3458 5. Metabolic Bone Disease: Will check a phos (previously was 5.1) as well as a iPTH. 6. Anxiety 7. PAD? - has strong FH and also discoloration in both feet with lesions in 2 digits on the left side. Will also have [facility] evaluate and request an ABI.  Addendum 12/21 '@735am'$ : spoke with Dr. CBridgett Larsson([facility]) who also confirmed Dr. ELuther Parodyrecommendation that it would be ok to cannulated before 11/06/16 if she needs to initiate dialysis. Use 17ga needles during this hospitalization. Will need SW for placement at outpt center. Lastly, if arterial duplex shows  significant disease then call [facility] @ that time. If negative no need to consult [facility].  Primary Nephrologist:  Dr. SJoelyn OmsAccess : Lt BCF  HPI: NCARAGH GASPERis a 75y.o. female with CKDV followed by Dr. SJoelyn Omsseen yesterday with increase in Lasix to 120/'80mg'$  AM/PM because of worsening lower extremity edema. She is presenting to the hospital for worsening dyspnea, weakness, fatigue, lower extremity edema and anorexia. She was offered initiation of dialysis by Dr. SJoelyn Omsyesterday as much of her sxs could be attributed to uremia but she wanted to wait till after the holiday season. She has also had frequent falls but no loss of consciousness; she also denies CP, dysgeusia or pruritus or N/V. She was found to have a BUN/Cr 114/4.61 which is not far off her baseline.   Past Medical History:  Diagnosis Date  . Anemia   . CAD (coronary artery disease)   . CFS (chronic fatigue syndrome)   . Chronic rhinitis   . Complication of anesthesia    "a little hard waking up"  . Constipation    due to iron  . Coronary atherosclerosis   . Depression   . Difficult intubation    "I have a small opening" When she had her CABG   . DJD (degenerative joint disease)   . Hyperlipidemia   . Hypertension   . Hypothyroidism   . Kidney stone   . MDD (major depressive disorder)   . Neuropathy ([place]  in legs  . Obesity   . Osteopenia   . Renal insufficiency    Post Op  . Stroke [facility])    TIA, several    Medications: I have reviewed the patient's current medications.  Allergies:  Allergies  Allergen Reactions  . Prednisone Other (See Comments)    Makes her loony    Past Surgical History:  Procedure Laterality Date  . ABDOMINAL HYSTERECTOMY  1976  . AORTIC VALVE REPLACEMENT    . AV FISTULA PLACEMENT Left 08/07/2016   Procedure: CREATION OF  LEFT UPPER ARM ARTERIOVENOUS (AV) FISTULA;  Surgeon: Rosetta Posner, MD;  Location: [place];  Service: Vascular;  Laterality: Left;  . BACK SURGERY  1980's  .  CARDIAC VALVE REPLACEMENT    . CORONARY ARTERY BYPASS GRAFT    . KNEE ARTHROSCOPY      Family History  Problem Relation Age of Onset  . Alzheimer's disease Mother   . Aortic stenosis Father   . Heart disease Father   . Heart disease Son     Social History:  reports that she has never smoked. She has never used smokeless tobacco. She reports that she does not drink alcohol or use drugs.  ROS Pertinent items are noted in HPI. Most of history from son who was bedside  Blood pressure (!) 154/84, pulse 77, temperature 98.2 F (36.8 C), temperature source Oral, resp. rate 18, height 5' (1.524 m), weight 73.9 kg (163 lb), SpO2 96 %. General appearance: slowed mentation Eyes: negative Neck: no adenopathy, no carotid bruit, supple, symmetrical, trachea midline and thyroid not enlarged, symmetric, no tenderness/mass/nodules Back: kyphosis Resp: rales bibasilar Chest wall: no tenderness Cardio: S1, S2 normal and harsh  systolic murmur GI: soft, non-tender; bowel sounds normal; no masses,  no organomegaly Extremities: edema 1+ Pulses: very weak DP Skin: toes and dorsal aspect discolored w/ hyperemic patches and ulceration in two of the left foot digits Lymph nodes: Cervical, supraclavicular, and axillary nodes normal. Neurologic: Mental status: alertness: lethargic  Results for orders placed or performed during the hospital encounter of 10/28/16 (from the past 48 hour(s))  Comprehensive metabolic panel     Status: Abnormal   Collection Time: 10/28/16  3:45 PM  Result Value Ref Range   Sodium 134 (L) 135 - 145 mmol/L   Potassium 5.0 3.5 - 5.1 mmol/L   Chloride 99 (L) 101 - 111 mmol/L   CO2 25 22 - 32 mmol/L   Glucose, Bld 129 (H) 65 - 99 mg/dL   BUN 114 (H) 6 - 20 mg/dL    Comment: RESULTS CONFIRMED BY MANUAL DILUTION   Creatinine, Ser 4.61 (H) 0.44 - 1.00 mg/dL   Calcium 8.4 (L) 8.9 - 10.3 mg/dL   Total Protein 6.3 (L) 6.5 - 8.1 g/dL   Albumin 3.1 (L) 3.5 - 5.0 g/dL   AST 24 15  - 41 U/L   ALT 15 14 - 54 U/L   Alkaline Phosphatase 37 (L) 38 - 126 U/L   Total Bilirubin 1.1 0.3 - 1.2 mg/dL   GFR calc non Af Amer 8 (L) >60 mL/min   GFR calc Af Amer 10 (L) >60 mL/min    Comment: (NOTE) The eGFR has been calculated using the CKD EPI equation. This calculation has not been validated in all clinical situations. eGFR's persistently <60 mL/min signify possible Chronic Kidney Disease.    Anion gap 10 5 - 15  CBC with Differential/Platelet     Status: Abnormal   Collection Time: 10/28/16  3:45 PM  Result Value Ref Range   WBC 10.2 4.0 - 10.5 K/uL   RBC 2.33 (L) 3.87 - 5.11 MIL/uL   Hemoglobin 7.6 (L) 12.0 - 15.0 g/dL   HCT 23.8 (L) 36.0 - 46.0 %   MCV 102.1 (H) 78.0 - 100.0 fL   MCH 32.6 26.0 - 34.0 pg   MCHC 31.9 30.0 - 36.0 g/dL   RDW 15.5 11.5 - 15.5 %   Platelets 293 150 - 400 K/uL   Neutrophils Relative % 86 %   Neutro Abs 8.6 (H) 1.7 - 7.7 K/uL   Lymphocytes Relative 10 %   Lymphs Abs 1.1 0.7 - 4.0 K/uL   Monocytes Relative 4 %   Monocytes Absolute 0.4 0.1 - 1.0 K/uL   Eosinophils Relative 0 %   Eosinophils Absolute 0.0 0.0 - 0.7 K/uL   Basophils Relative 0 %   Basophils Absolute 0.0 0.0 - 0.1 K/uL  Troponin I     Status: Abnormal   Collection Time: 10/28/16  3:45 PM  Result Value Ref Range   Troponin I 0.04 (HH) <0.03 ng/mL    Comment: CRITICAL RESULT CALLED TO, READ BACK BY AND VERIFIED WITH: MARVA SIMMS RN AT 3244 10/28/16 BY I.[email or web address]   Protime-INR     Status: Abnormal   Collection Time: 10/28/16  3:45 PM  Result Value Ref Range   Prothrombin Time 16.8 (H) 11.4 - 15.2 seconds   INR 1.35   Brain natriuretic peptide     Status: Abnormal   Collection Time: 10/28/16  3:45 PM  Result Value Ref Range   B Natriuretic Peptide 1,384.6 (H) 0.0 - 100.0 pg/mL  Urinalysis, Routine w reflex microscopic     Status: Abnormal   Collection Time: 10/28/16  4:00 PM  Result Value Ref Range   Color, Urine YELLOW YELLOW   APPearance CLEAR CLEAR   Specific  Gravity, Urine 1.011 1.005 - 1.030   pH 6.5 5.0 - 8.0   Glucose, UA NEGATIVE NEGATIVE mg/dL   Hgb urine dipstick NEGATIVE NEGATIVE   Bilirubin Urine NEGATIVE NEGATIVE   Ketones, ur NEGATIVE NEGATIVE mg/dL   Protein, ur 30 (A) NEGATIVE mg/dL   Nitrite NEGATIVE NEGATIVE   Leukocytes, UA NEGATIVE NEGATIVE  Urinalysis, Microscopic (reflex)     Status: Abnormal   Collection Time: 10/28/16  4:00 PM  Result Value Ref Range   RBC / HPF 0-5 0 - 5 RBC/hpf   WBC, UA NONE SEEN 0 - 5 WBC/hpf   Bacteria, UA RARE (A) NONE SEEN   Squamous Epithelial / LPF 0-5 (A) NONE SEEN    Dg Chest 2 View  Result Date: 10/28/2016 CLINICAL DATA:  Chest pain, anxiety, history coronary disease post CABG, hypertension, stroke, acute diastolic heart failure, prior AVR EXAM: CHEST  2 VIEW COMPARISON:  11/13/2015 FINDINGS: Enlargement of cardiac silhouette post CABG and AVR. Pulmonary vascular congestion. BILATERAL pulmonary infiltrates favoring pulmonary edema and CHF. Atherosclerotic calcification aorta. Rotation to the LEFT on the AP view. No gross pleural effusion or pneumothorax. Bones demineralized. IMPRESSION: Probable CHF. Post CABG and AVR. Aortic atherosclerosis. Electronically Signed   By: Lavonia Dana M.D.   On: 10/28/2016 16:40     Dwana Melena, MD 10/28/2016, 8:53 PM

## 2016-10-29 ENCOUNTER — Inpatient Hospital Stay (HOSPITAL_COMMUNITY): Payer: Commercial Managed Care - HMO

## 2016-10-29 ENCOUNTER — Ambulatory Visit (INDEPENDENT_AMBULATORY_CARE_PROVIDER_SITE_OTHER): Payer: Commercial Managed Care - HMO | Admitting: Orthopaedic Surgery

## 2016-10-29 ENCOUNTER — Encounter (HOSPITAL_COMMUNITY): Payer: Commercial Managed Care - HMO

## 2016-10-29 DIAGNOSIS — R071 Chest pain on breathing: Secondary | ICD-10-CM

## 2016-10-29 DIAGNOSIS — I779 Disorder of arteries and arterioles, unspecified: Secondary | ICD-10-CM

## 2016-10-29 DIAGNOSIS — R748 Abnormal levels of other serum enzymes: Secondary | ICD-10-CM

## 2016-10-29 DIAGNOSIS — R609 Edema, unspecified: Secondary | ICD-10-CM

## 2016-10-29 DIAGNOSIS — R600 Localized edema: Secondary | ICD-10-CM

## 2016-10-29 DIAGNOSIS — E8770 Fluid overload, unspecified: Secondary | ICD-10-CM

## 2016-10-29 DIAGNOSIS — I70203 Unspecified atherosclerosis of native arteries of extremities, bilateral legs: Secondary | ICD-10-CM

## 2016-10-29 LAB — VAS US LOWER EXTREMITY ARTERIAL DUPLEX
LATIBMIDIA: 7 cm/s
LEFT SFA DIST DYS VEL: -1 cm/s
LEFT SFA MID VEL: 1 cm/s
LPOPPPSV: 58 cm/s
LPOPPROXDYSV: 0 cm/s
LPTIBMIDDIA: 8 cm/s
Left ant tibial mid sys: 58 cm/s
Left super femoral dist sys PSV: -88 cm/s
Left super femoral mid sys PSV: 109 cm/s
RATIBMIDDIA: -8 cm/s
RATIBMIDSYS: -69 cm/s
RCCAPSYS: 196 cm/s
RIGHT PERO MID SYS: 55 cm/s
RIGHT POPLITEAL DIST EDV: 0 cm/s
RIGHT POPLITEAL PROX EDV: 0 cm/s
RIGHT POST TIB DIST DIA: 4 cm/s
RIGHT POST TIB DIST SYS: 31 cm/s
RIGHT POST TIB MID DIA: 5 cm/s
RIGHT POST TIB MID SYS: 34 cm/s
RIGHT SUPER FEMORAL DIST EDV: -1 cm/s
RIGHT SUPER FEMORAL MID EDV: 1 cm/s
RPEROMIDDIA: 7 cm/s
RPOPDPSV: 94 cm/s
RSFDPSV: -132 cm/s
RSFMPSV: 107 cm/s
RSFPPSV: -120 cm/s
RTSFAPROXDIA: 0 cm/s
Right CCA prox dias: 1 cm/s
Right popliteal prox sys PSV: 110 cm/s
left post tibial mid sys: 59 cm/s

## 2016-10-29 LAB — MAGNESIUM: Magnesium: 2.3 mg/dL (ref 1.7–2.4)

## 2016-10-29 LAB — CBC
HEMATOCRIT: 19.8 % — AB (ref 36.0–46.0)
Hemoglobin: 6.5 g/dL — CL (ref 12.0–15.0)
MCH: 32.5 pg (ref 26.0–34.0)
MCHC: 32.8 g/dL (ref 30.0–36.0)
MCV: 99 fL (ref 78.0–100.0)
Platelets: 262 10*3/uL (ref 150–400)
RBC: 2 MIL/uL — ABNORMAL LOW (ref 3.87–5.11)
RDW: 16 % — AB (ref 11.5–15.5)
WBC: 9.1 10*3/uL (ref 4.0–10.5)

## 2016-10-29 LAB — RENAL FUNCTION PANEL
Albumin: 2.6 g/dL — ABNORMAL LOW (ref 3.5–5.0)
Anion gap: 10 (ref 5–15)
BUN: 131 mg/dL — AB (ref 6–20)
CHLORIDE: 103 mmol/L (ref 101–111)
CO2: 27 mmol/L (ref 22–32)
Calcium: 8.3 mg/dL — ABNORMAL LOW (ref 8.9–10.3)
Creatinine, Ser: 4.86 mg/dL — ABNORMAL HIGH (ref 0.44–1.00)
GFR calc Af Amer: 9 mL/min — ABNORMAL LOW (ref >60)
GFR calc non Af Amer: 8 mL/min — ABNORMAL LOW (ref >60)
GLUCOSE: 103 mg/dL — AB (ref 65–99)
POTASSIUM: 4.9 mmol/L (ref 3.5–5.1)
Phosphorus: 4.3 mg/dL (ref 2.5–4.6)
Sodium: 140 mmol/L (ref 135–145)

## 2016-10-29 LAB — URINE CULTURE: Culture: NO GROWTH

## 2016-10-29 LAB — PREPARE RBC (CROSSMATCH)

## 2016-10-29 LAB — HEPATITIS B SURFACE ANTIGEN: Hepatitis B Surface Ag: NEGATIVE

## 2016-10-29 LAB — HEMOGLOBIN AND HEMATOCRIT, BLOOD
HCT: 21.1 % — ABNORMAL LOW (ref 36.0–46.0)
Hemoglobin: 7.1 g/dL — ABNORMAL LOW (ref 12.0–15.0)

## 2016-10-29 LAB — TROPONIN I
Troponin I: 0.08 ng/mL (ref <0.03)
Troponin I: 0.08 ng/mL (ref <0.03)

## 2016-10-29 LAB — MRSA PCR SCREENING: MRSA by PCR: NEGATIVE

## 2016-10-29 LAB — OCCULT BLOOD X 1 CARD TO LAB, STOOL: FECAL OCCULT BLD: POSITIVE — AB

## 2016-10-29 MED ORDER — PANTOPRAZOLE SODIUM 40 MG IV SOLR
40.0000 mg | Freq: Two times a day (BID) | INTRAVENOUS | Status: DC
  Administered 2016-10-29 – 2016-11-02 (×8): 40 mg via INTRAVENOUS
  Filled 2016-10-29 (×8): qty 40

## 2016-10-29 MED ORDER — TRAMADOL HCL 50 MG PO TABS
50.0000 mg | ORAL_TABLET | Freq: Once | ORAL | Status: AC | PRN
  Administered 2016-10-31: 50 mg via ORAL
  Filled 2016-10-29 (×2): qty 1

## 2016-10-29 MED ORDER — GUAIFENESIN-DM 100-10 MG/5ML PO SYRP
5.0000 mL | ORAL_SOLUTION | ORAL | Status: DC | PRN
  Administered 2016-10-29: 5 mL via ORAL
  Filled 2016-10-29: qty 5

## 2016-10-29 MED ORDER — SODIUM CHLORIDE 0.9 % IV SOLN
Freq: Once | INTRAVENOUS | Status: AC
  Administered 2016-10-29: 06:00:00 via INTRAVENOUS

## 2016-10-29 MED ORDER — ONDANSETRON HCL 4 MG/2ML IJ SOLN
4.0000 mg | Freq: Four times a day (QID) | INTRAMUSCULAR | Status: DC | PRN
  Administered 2016-10-29 – 2016-10-30 (×3): 4 mg via INTRAVENOUS
  Filled 2016-10-29 (×4): qty 2

## 2016-10-29 MED ORDER — SODIUM CHLORIDE 0.9 % IV BOLUS (SEPSIS): 500.0000 mL | Freq: Once | INTRAVENOUS | Status: DC

## 2016-10-29 MED ORDER — MORPHINE SULFATE (PF) 2 MG/ML IV SOLN: 1.0000 mg | Freq: Once | INTRAVENOUS | Status: DC | PRN

## 2016-10-29 MED ORDER — FUROSEMIDE 10 MG/ML IJ SOLN: 20.0000 mg | Freq: Once | INTRAMUSCULAR | Status: DC

## 2016-10-29 NOTE — Progress Notes (Signed)
Patient awoke from sleep. Complaining of nausea and trying to have a bowel movement. 2+ assist to bedside commode. Pt very weak and pale. Had a large bowel movement x2. GI bleed smell noted. Recent hemoglobin 6.5. Paged NP Craige CottaKirby. Hemocult positive. Ordered 1 unit of blood to be transfused. Aware of pt's fluid overload. Ordered IV lasix to be given post-blood transfusion. Blood consent signed. Sister at bedside and is updated about patient's status. Will transfuse blood and continue to monitor.

## 2016-10-29 NOTE — Consult Note (Signed)
CARDIOLOGY CONSULT NOTE   Patient ID: Jill Johnson MRN: [id number] DOB/AGE: 1941-06-24 75 y.o.  Admit date: 10/28/2016  Primary Physician   Hollice EspyGATES,DONNA RUTH, MD Primary Cardiologist   Dr Anne FuSkains Reason for Consultation   Chest pain Requesting MD: Dr Madelyn Flavorsondell Smith  WUJ:[id number]:Jill Johnson is a 75 y.o. year old female with a history of 21mm bioprosthetic AVR/CABG 01/2010 w/ LIMA to LAD, SVG to PDA, SVG to Diag 1, HLD w/ Crestor intolerance, ESRD s/p fistula HTN, hypothyroid, TIA/CVA.   Pt admitted w/ SOB and chest pain, cards asked to see.   Pt currently in HD. Pt is very sleepy, rouses to touch and verbal, but falls asleep in between questions. Her answers are very brief. Pt had Neurontin 300 mg and Ativan 0.5 mg last pm, nothing sedating in > 8 hr.    Pt remembers having chest pain, but cannot rate it or say how often she got it. She is not having it now. Her SOB is starting to get a little better since HD started and she has gotten a unit of blood. She does not think the pain was like her pre-CABG pain.     Past Medical History:  Diagnosis Date  . Anemia   . CAD (coronary artery disease)   . CFS (chronic fatigue syndrome)   . Chronic rhinitis   . Complication of anesthesia    "a little hard waking up"  . Constipation    due to iron  . Coronary atherosclerosis   . Depression   . Difficult intubation    "I have a small opening" When she had her CABG   . DJD (degenerative joint disease)   . Hyperlipidemia   . Hypertension   . Hypothyroidism   . Kidney stone   . MDD (major depressive disorder)   . Neuropathy (HCC)    in legs  . Obesity   . Osteopenia   . Renal insufficiency    Post Op  . Stroke [facility](H[facility])    TIA, several     Past Surgical History:  Procedure Laterality Date  . ABDOMINAL HYSTERECTOMY  1976  . AORTIC VALVE REPLACEMENT    . AV FISTULA PLACEMENT Left 08/07/2016   Procedure: CREATION OF  LEFT UPPER ARM ARTERIOVENOUS (AV) FISTULA;  Surgeon: Larina Earthlyodd F  Early, MD;  [facility];  Service: Vascular;  Laterality: Left;  . BACK SURGERY  1980's  . CARDIAC VALVE REPLACEMENT    . CORONARY ARTERY BYPASS GRAFT    . KNEE ARTHROSCOPY      Allergies  Allergen Reactions  . Prednisone Other (See Comments)    Makes her loony    I have reviewed the patient's current medications . amLODipine  10 mg Oral Daily  . aspirin  325 mg Oral Daily  . cholecalciferol  2,000 Units Oral Daily  . furosemide  20 mg Intravenous Once  . furosemide  120 mg Oral QAC breakfast  . furosemide  80 mg Oral QPM  . gabapentin  300 mg Oral Daily  . heparin  5,000 Units Subcutaneous Q8H  . levothyroxine  112 mcg Oral QAC breakfast  . losartan  50 mg Oral QHS  . vitamin B-12  100 mcg Oral Daily  . zinc oxide   Topical BID    acetaminophen **OR** acetaminophen, albuterol, guaiFENesin-dextromethorphan, morphine injection, ondansetron, traMADol  Prior to Admission medications   Medication Sig Start Date End Date Taking? Authorizing Provider  acetaminophen (TYLENOL) 650 MG CR tablet Take  650 mg by mouth 2 (two) times daily.     Historical Provider, MD  amLODipine (NORVASC) 10 MG tablet Take 1 tablet (10 mg total) by mouth daily. 05/10/14   [name], MD  aspirin 325 MG tablet Take 325 mg by mouth daily.    Historical Provider, MD  cholecalciferol (VITAMIN D) 1000 units tablet Take 2,000 Units by mouth daily.     Historical Provider, MD  Cyanocobalamin (VITAMIN B-12 PO) Take 1 tablet by mouth daily.    Historical Provider, MD  furosemide (LASIX) 40 MG tablet Take 160 mg by mouth daily.     Historical Provider, MD  gabapentin (NEURONTIN) 300 MG capsule Take 300 mg by mouth daily.  03/27/16   Historical Provider, MD  levothyroxine (SYNTHROID, LEVOTHROID) 112 MCG tablet Take 112 mcg by mouth daily before breakfast.  12/11/15   Historical Provider, MD  losartan (COZAAR) 25 MG tablet Take 50 mg by mouth at bedtime.  01/29/16   Historical Provider, MD  Magnesium Hydroxide  (MAGNESIA PO) Take 1 tablet by mouth at bedtime.     Historical Provider, MD  UNABLE TO FIND Iron Infusion once a month    Historical Provider, MD     Social History   Social History  . Marital status: Divorced    Spouse name: N/A  . Number of children: N/A  . Years of education: N/A   Occupational History  . RETIRED NURSE    Social History Main Topics  . Smoking status: Never Smoker  . Smokeless tobacco: Never Used  . Alcohol use No  . Drug use: No  . Sexual activity: Not on file   Other Topics Concern  . Not on file   Social History Narrative  . No narrative on file    Family Status  Relation Status  . Mother Deceased  . Father Deceased  . Son    Family History  Problem Relation Age of Onset  . Alzheimer's disease Mother   . Aortic stenosis Father   . Heart disease Father   . Heart disease Son      ROS:  Full 14 point review of systems complete and found to be negative unless listed above.  Physical Exam: Blood pressure (!) 154/49, pulse 71, temperature 98.3 F (36.8 C), temperature source Oral, resp. rate 14, height 5' (1.524 m), weight 160 lb 9.6 oz (72.8 kg), SpO2 98 %.  General: Well developed, well nourished, female in no acute distress Head: Eyes PERRLA, No xanthomas.   Normocephalic and atraumatic, oropharynx without edema or exudate. Dentition: poor Lungs: clear anteriorly Heart: HRRR S1 S2, no rub/gallop, 2/6 murmur. pulses are 2+ all 4 extrem.   Neck: Murmur radiates to L carotid. No lymphadenopathy.  JVD mildly elevated Abdomen: Bowel sounds present, abdomen soft and non-tender without masses or hernias noted. Msk:  No spine or cva tenderness. No weakness, no joint deformities or effusions. Extremities: No clubbing or cyanosis. Trace LLE edema.  Neuro: Sleepy, but oriented X 2. No focal deficits noted. Skin: No rashes or lesions noted. Some redness distal tibial area  Labs:   Lab Results  Component Value Date   WBC 9.1 10/29/2016   HGB 6.5  (LL) 10/29/2016   HCT 19.8 (L) 10/29/2016   MCV 99.0 10/29/2016   PLT 262 10/29/2016    Recent Labs  10/28/16 1545  INR 1.35    Recent Labs Lab 10/28/16 1545 10/29/16 0225  NA 134* 140  K 5.0 4.9  CL 99* 103  CO2 25 27  BUN 114* 131*  CREATININE 4.61* 4.86*  CALCIUM 8.4* 8.3*  PROT 6.3*  --   BILITOT 1.1  --   ALKPHOS 37*  --   ALT 15  --   AST 24  --   GLUCOSE 129* 103*  ALBUMIN 3.1* 2.6*    Recent Labs  10/28/16 1545 10/28/16 2116 10/29/16 0225  TROPONINI 0.04* 0.06* 0.08*   B Natriuretic Peptide  Date/Time Value Ref Range Status  10/28/2016 03:45 PM 1,384.6 (H) 0.0 - 100.0 pg/mL Final   Ferritin  Date/Time Value Ref Range Status  10/20/2016 09:29 AM 387 (H) 11 - 307 ng/mL Final    Comment:    Performed at [facility]   TIBC  Date/Time Value Ref Range Status  10/20/2016 09:29 AM 354 250 - 450 ug/dL Final   Iron  Date/Time Value Ref Range Status  10/20/2016 09:29 AM 49 28 - 170 ug/dL Final    Echo: 16/1096 - Left ventricle: The cavity size was normal. There was moderate   concentric hypertrophy. Systolic function was normal. The   estimated ejection fraction was in the range of 55% to 60%. Wall   motion was normal; there were no regional wall motion   abnormalities. Features are consistent with a pseudonormal left   ventricular filling pattern, with concomitant abnormal relaxation   and increased filling pressure (grade 2 diastolic dysfunction).   Doppler parameters are consistent with high ventricular filling   pressure. - Aortic valve: A pericardial bioprosthesis was present. Mean   gradient (S): 19 mm Hg. Peak gradient (S): 37 mm Hg. - Aorta: Aortic root dimension: 41 mm (ED). Ascending aortic   diameter: 38 mm (S). - Aortic root: The aortic root was mildly dilated at the Sinuses of   Valsalva. - Ascending aorta: The ascending aorta was mildly dilated. - Mitral valve: Marked mitral annular calcification of the   posterior mitral  valve annulus. The mitral valve leaflets are   mildly thickened and calcified . Calcified annulus. There was   mild regurgitation directed eccentrically. - Left atrium: The atrium was severely dilated. - Pulmonic valve: There was trivial regurgitation. - Pulmonary arteries: PA peak pressure: 46 mm Hg (S). Impressions: - The right ventricular systolic pressure was increased consistent   with moderate pulmonary hypertension.  ECG:  12/20 SR, non-specific conduction delay, no sig change  Radiology:  Dg Chest 2 View Result Date: 10/28/2016 CLINICAL DATA:  Chest pain, anxiety, history coronary disease post CABG, hypertension, stroke, acute diastolic heart failure, prior AVR EXAM: CHEST  2 VIEW COMPARISON:  11/13/2015 FINDINGS: Enlargement of cardiac silhouette post CABG and AVR. Pulmonary vascular congestion. BILATERAL pulmonary infiltrates favoring pulmonary edema and CHF. Atherosclerotic calcification aorta. Rotation to the LEFT on the AP view. No gross pleural effusion or pneumothorax. Bones demineralized. IMPRESSION: Probable CHF. Post CABG and AVR. Aortic atherosclerosis. Electronically Signed   By: Ulyses Southward M.D.   On: 10/28/2016 16:40    ASSESSMENT AND PLAN:   The patient was seen today by , the patient evaluated and the data reviewed.   1. Chest pain - Sx not very clear, but in the setting of volume overload and anemia, would defer ischemic eval until other issues stabilized. - will ck echo to make sure no new changes - will tentatively schedule MV for am, may have to push out further, MD advise if ok to do as outpt.  2.  S/P AVR (aortic valve replacement) - ck echo to make sure  valve is functioning well and no effusion  Otherwise, per IM Principal Problem:   Fluid overload Active Problems:   Elevated troponin   Anemia of chronic disease   ESRD (end stage renal disease) (HCC)   Hypothyroidism   Respiratory distress   Signed: Theodore Johnson, Rhonda, PA-C 10/29/2016 7:10  AM Beeper [phone]  Co-Sign MD  Patient examined chart reviewed.  Atypical chest pain in setting of likely GI bleed profound anemia and acute on chronic Renal failure. Exam with pale elderly female. SEM through AVR no AR. Fistula LUE ? Infiltration with dialysis. Basilar Crackles lungs. She is not a cath candidate currently. Minimal troponin bump in setting of CRF not significant. Will order echo to make sure EF still normal and AVR no SBE. No rub on exam and ECG is normal. Tentatively schedule myovue for am but she may not be ready for it and frankly ischemic evaluation not needed right away while other medical issues handled  Charlton HawsPeter Johnson

## 2016-10-29 NOTE — Progress Notes (Signed)
PROGRESS NOTE    Jill Johnson  EAV:[id number] DOB: 06/19/1941 DOA: 10/28/2016 PCP: Hollice EspyGATES,DONNA RUTH, MD   Chief Complaint  Patient presents with  . Shortness of Breath    anxiety     Brief Narrative:  HPI on 10/28/2016 by Dr. Madelyn Flavorsondell Smith Jill Johnson is a 75 y.o. female with medical history significant of HTN, HLD, CKD, CAD s/p CABG, AVR, anemia, depression, hypothyroidism; who presents with complaints of shortness of breath. History is obtained by the patient's son as the patient is currently upset and unable to gather herself to provide adequate history. Patient had just recently been seen by her nephrologist Dr. Marisue HumbleSanford yesterday, and was told that her fistula was ready to be used and that it was time to start dialysis. However, the patient wanted to wait until after Christmas. Since that time the patient has had complaints of intermittent chest pain and been very anxious stating that she cannot breathe. She describes the chest pain more so as pressure and tightness. Associated symptoms included increasing swelling in her bilateral lower extremities, more depressed, and easily fatigued. Patient has had no loss of consciousness, focal weakness, changes in vision, abdominal pain, nausea, vomiting, or fever. At this time she still makes urine, but the son is unsure if she took her Lasix this morning. Assessment & Plan   Acute respiratory distress -Likely secondary to hypervolemia secondary to ESRD vs anemia  -Presented with shortness of breath, elevated BNP, CXR showing probable CHF -Nephrology consulted and appreciated -Suspect will improve with HD and diuresis -Continue supplemental oxygen to maintain O2 sats >92% -Continue nebs PRN  Symptomatic anemia/Anemia of chronic disease/Possible GI bleed -hemoglobin upon admission 7.6, baseline 8-9 -Hemoglobin dropped overnight to 6.5, given 1uPRBC -Plan to give additional until 12/22 with HD -patient noted to have large bowel movement  overnight, FOBT + -Gastroenterology consulted and appreciated. -patient has not had a colonoscopy or endoscopy   Chest pain with elevated troponin -Suspect related to demand ischemia. -Troponins 0.04 - 0.06 - 0.08 -Cardiology consulted and appreciated, echocardiogram pending, possible myoview on 12/22.  Anxiety/depression -Would benefit from SSRI -Received Ativan overnight  Coronary artery disease s/p CABG/AVR -Continue aspirin  Essential hypertension -Continue Cozaar and amlodipine  Peripheral neuropathy/toe wounds -Suspect some aspect of peripheral vascular disease due to decreased circulation of lower extremities.  -Continue gabapentin -ABI and/or VVS consult recommended per nephrology   Hypothyroidism -Continue levothyroxine  DVT Prophylaxis  SCDs  Code Status: Full  Family Communication: None at bedside  Disposition Plan: Admitted  Consultants Cardiology Nephrology Vascular surgery Gastroenterology   Procedures  None  Antibiotics   Anti-infectives    None      Subjective:   Jill Johnson seen and examined today in hemodialysis.  Patient feels very sleepy and weak. Cannot tell me much about her chest pain.  Had some shortness of breath, but cannot elaborate.  Denies abdominal pain.  Did have large bowel movement overnight.   Objective:   Vitals:   10/29/16 0535 10/29/16 0602 10/29/16 0800 10/29/16 0815  BP: (!) 153/48 (!) 154/49 136/60 (!) 163/69  Pulse: 74 71 66 75  Resp: 18 14 15 15   Temp: 98.1 F (36.7 C) 98.3 F (36.8 C) 98.8 F (37.1 C) 98.8 F (37.1 C)  TempSrc: Oral Oral Oral Oral  SpO2: 96% 98% 95% 95%  Weight:      Height:        Intake/Output Summary (Last 24 hours) at 10/29/16 1420 Last data filed at 10/29/16  0815  Gross per 24 hour  Intake             1160 ml  Output                0 ml  Net             1160 ml   Filed Weights   10/28/16 1438 10/29/16 0436  Weight: 73.9 kg (163 lb) 72.8 kg (160 lb 9.6 oz)     Exam  General: Well developed, well nourished, NAD, appears stated age  HEENT: NCAT, mucous membranes moist.   Cardiovascular: S1 S2 auscultated, 2/6 SEM, RRR  Respiratory: Clear to auscultation bilaterally with equal chest rise  Abdomen: Soft, nontender, nondistended, + bowel sounds  Extremities: warm dry without cyanosis clubbing. +LE edema. Blisters on RLE   Neuro: AAOx3, nonfocal  Data Reviewed: I have personally reviewed following labs and imaging studies  CBC:  Recent Labs Lab 10/28/16 1545 10/29/16 0225 10/29/16 1024  WBC 10.2 9.1  --   NEUTROABS 8.6*  --   --   HGB 7.6* 6.5* 7.1*  HCT 23.8* 19.8* 21.1*  MCV 102.1* 99.0  --   PLT 293 262  --    Basic Metabolic Panel:  Recent Labs Lab 10/28/16 1545 10/29/16 0225 10/29/16 0835  NA 134* 140  --   K 5.0 4.9  --   CL 99* 103  --   CO2 25 27  --   GLUCOSE 129* 103*  --   BUN 114* 131*  --   CREATININE 4.61* 4.86*  --   CALCIUM 8.4* 8.3*  --   MG  --   --  2.3  PHOS  --  4.3  --    GFR: Estimated Creatinine Clearance: 8.6 mL/min (by C-G formula based on SCr of 4.86 mg/dL (H)). Liver Function Tests:  Recent Labs Lab 10/28/16 1545 10/29/16 0225  AST 24  --   ALT 15  --   ALKPHOS 37*  --   BILITOT 1.1  --   PROT 6.3*  --   ALBUMIN 3.1* 2.6*   No results for input(s): LIPASE, AMYLASE in the last 168 hours. No results for input(s): AMMONIA in the last 168 hours. Coagulation Profile:  Recent Labs Lab 10/28/16 1545  INR 1.35   Cardiac Enzymes:  Recent Labs Lab 10/28/16 1545 10/28/16 2116 10/29/16 0225 10/29/16 0835  TROPONINI 0.04* 0.06* 0.08* 0.08*   BNP (last 3 results) No results for input(s): PROBNP in the last 8760 hours. HbA1C: No results for input(s): HGBA1C in the last 72 hours. CBG: No results for input(s): GLUCAP in the last 168 hours. Lipid Profile: No results for input(s): CHOL, HDL, LDLCALC, TRIG, CHOLHDL, LDLDIRECT in the last 72 hours. Thyroid Function  Tests: No results for input(s): TSH, T4TOTAL, FREET4, T3FREE, THYROIDAB in the last 72 hours. Anemia Panel: No results for input(s): VITAMINB12, FOLATE, FERRITIN, TIBC, IRON, RETICCTPCT in the last 72 hours. Urine analysis:    Component Value Date/Time   COLORURINE YELLOW 10/28/2016 1600   APPEARANCEUR CLEAR 10/28/2016 1600   LABSPEC 1.011 10/28/2016 1600   PHURINE 6.5 10/28/2016 1600   GLUCOSEU NEGATIVE 10/28/2016 1600   HGBUR NEGATIVE 10/28/2016 1600   BILIRUBINUR NEGATIVE 10/28/2016 1600   KETONESUR NEGATIVE 10/28/2016 1600   PROTEINUR 30 (A) 10/28/2016 1600   UROBILINOGEN 1.0 01/30/2010 1104   NITRITE NEGATIVE 10/28/2016 1600   LEUKOCYTESUR NEGATIVE 10/28/2016 1600   Sepsis Labs: @LABRCNTIP (procalcitonin:4,lacticidven:4)  ) Recent Results (from the past 240  hour(s))  MRSA PCR Screening     Status: None   Collection Time: 10/28/16  8:57 PM  Result Value Ref Range Status   MRSA by PCR NEGATIVE NEGATIVE Final    Comment:        The GeneXpert MRSA Assay (FDA approved for NASAL specimens only), is one component of a comprehensive MRSA colonization surveillance program. It is not intended to diagnose MRSA infection nor to guide or monitor treatment for MRSA infections.       Radiology Studies: Dg Chest 2 View  Result Date: 10/28/2016 CLINICAL DATA:  Chest pain, anxiety, history coronary disease post CABG, hypertension, stroke, acute diastolic heart failure, prior AVR EXAM: CHEST  2 VIEW COMPARISON:  11/13/2015 FINDINGS: Enlargement of cardiac silhouette post CABG and AVR. Pulmonary vascular congestion. BILATERAL pulmonary infiltrates favoring pulmonary edema and CHF. Atherosclerotic calcification aorta. Rotation to the LEFT on the AP view. No gross pleural effusion or pneumothorax. Bones demineralized. IMPRESSION: Probable CHF. Post CABG and AVR. Aortic atherosclerosis. Electronically Signed   By: Ulyses Southward M.D.   On: 10/28/2016 16:40     Scheduled Meds: .  amLODipine  10 mg Oral Daily  . aspirin  325 mg Oral Daily  . cholecalciferol  2,000 Units Oral Daily  . furosemide  20 mg Intravenous Once  . furosemide  120 mg Oral QAC breakfast  . furosemide  80 mg Oral QPM  . gabapentin  300 mg Oral Daily  . heparin  5,000 Units Subcutaneous Q8H  . levothyroxine  112 mcg Oral QAC breakfast  . losartan  50 mg Oral QHS  . pantoprazole (PROTONIX) IV  40 mg Intravenous Q12H  . vitamin B-12  100 mcg Oral Daily  . zinc oxide   Topical BID   Continuous Infusions:   LOS: 1 day   Time Spent in minutes   30 minutes  Stefannie Defeo D.O. on 10/29/2016 at 2:20 PM  Between 7am to 7pm - Pager - [phone]  After 7pm go to [email or web address] - password [email or web address]  And look for the night coverage person covering for me after hours  [facility] Office  [phone]

## 2016-10-29 NOTE — Consult Note (Signed)
Referring Provider:   Dr. Nunzio Cory Primary Care Physician:  Hollice Espy, MD Primary Gastroenterologist:  None [phone] patient  Reason for Consultation:  GIB  HPI: Jill Johnson is a 75 y.o. female adm w/ SOB/fluid overload, w/ large dk heme + stool last night and roughly 2-unit loss of bld over past 48hr (allowing for the fact that pt's hgb has dropped 1 gm and she's rec'd 1 u prc's).  Apparently no prior h/o GIB.  On ASA 325mg  qd (w/out PPI prophylaxis) PTA.  Was not c/o  GI sx but shortly after my exam, developed coffee grd emesis.  Med problems include CAD s/p CABG 2011 w/ bioprosthetic AoVR, ESRD w/ AV fistula 07/2016 not yet on HD, TIA/CVA.     Past Medical History:  Diagnosis Date  . Anemia   . CAD (coronary artery disease)   . CFS (chronic fatigue syndrome)   . Chronic rhinitis   . Complication of anesthesia    "a little hard waking up"  . Constipation    due to iron  . Coronary atherosclerosis   . Depression   . Difficult intubation    "I have a small opening" When she had her CABG   . DJD (degenerative joint disease)   . Hyperlipidemia   . Hypertension   . Hypothyroidism   . Kidney stone   . MDD (major depressive disorder)   . Neuropathy (HCC)    in legs  . Obesity   . Osteopenia   . Renal insufficiency    Post Op  . Stroke [facility])    TIA, several    Past Surgical History:  Procedure Laterality Date  . ABDOMINAL HYSTERECTOMY  1976  . AORTIC VALVE REPLACEMENT    . AV FISTULA PLACEMENT Left 08/07/2016   Procedure: CREATION OF  LEFT UPPER ARM ARTERIOVENOUS (AV) FISTULA;  Surgeon: Larina Earthly, MD;  Location: [facility] OR;  Service: Vascular;  Laterality: Left;  . BACK SURGERY  1980's  . CARDIAC VALVE REPLACEMENT    . CORONARY ARTERY BYPASS GRAFT    . KNEE ARTHROSCOPY      Prior to Admission medications   Medication Sig Start Date End Date Taking? Authorizing Provider  acetaminophen (TYLENOL) 650 MG CR tablet Take 650 mg by mouth 2 (two)  times daily.     Historical Provider, MD  amLODipine (NORVASC) 10 MG tablet Take 1 tablet (10 mg total) by mouth daily. 05/10/14   [name], MD  aspirin 325 MG tablet Take 325 mg by mouth daily.    Historical Provider, MD  cholecalciferol (VITAMIN D) 1000 units tablet Take 2,000 Units by mouth daily.     Historical Provider, MD  Cyanocobalamin (VITAMIN B-12 PO) Take 1 tablet by mouth daily.    Historical Provider, MD  furosemide (LASIX) 40 MG tablet Take 160 mg by mouth daily.     Historical Provider, MD  gabapentin (NEURONTIN) 300 MG capsule Take 300 mg by mouth daily.  03/27/16   Historical Provider, MD  levothyroxine (SYNTHROID, LEVOTHROID) 112 MCG tablet Take 112 mcg by mouth daily before breakfast.  12/11/15   Historical Provider, MD  losartan (COZAAR) 25 MG tablet Take 50 mg by mouth at bedtime.  01/29/16   Historical Provider, MD  Magnesium Hydroxide (MAGNESIA PO) Take 1 tablet by mouth at bedtime.     Historical Provider, MD  UNABLE TO FIND Iron Infusion once a month    Historical Provider, MD    Current Facility-Administered Medications  Medication Dose  Route Frequency Provider Last Rate Last Dose  . acetaminophen (TYLENOL) tablet 650 mg  650 mg Oral Q6H PRN Clydie Braunondell A Smith, MD       Or  . acetaminophen (TYLENOL) suppository 650 mg  650 mg Rectal Q6H PRN Rondell A Katrinka BlazingSmith, MD      . albuterol (PROVENTIL) (2.5 MG/3ML) 0.083% nebulizer solution 2.5 mg  2.5 mg Nebulization Q2H PRN Rondell A Katrinka BlazingSmith, MD      . amLODipine (NORVASC) tablet 10 mg  10 mg Oral Daily Clydie Braunondell A Smith, MD   10 mg at 10/29/16 1047  . cholecalciferol (VITAMIN D) tablet 2,000 Units  2,000 Units Oral Daily Clydie Braunondell A Smith, MD   2,000 Units at 10/28/16 2303  . furosemide (LASIX) injection 20 mg  20 mg Intravenous Once Leda GauzeKaren J Kirby-Graham, NP      . furosemide (LASIX) tablet 120 mg  120 mg Oral QAC breakfast Clydie Braunondell A Smith, MD   120 mg at 10/29/16 1047  . furosemide (LASIX) tablet 80 mg  80 mg Oral QPM Rondell A Katrinka BlazingSmith, MD       . gabapentin (NEURONTIN) capsule 300 mg  300 mg Oral Daily Clydie Braunondell A Smith, MD   300 mg at 10/29/16 1047  . guaiFENesin-dextromethorphan (ROBITUSSIN DM) 100-10 MG/5ML syrup 5 mL  5 mL Oral Q4H PRN Clydie Braunondell A Smith, MD   5 mL at 10/29/16 0035  . levothyroxine (SYNTHROID, LEVOTHROID) tablet 112 mcg  112 mcg Oral QAC breakfast Clydie Braunondell A Smith, MD   112 mcg at 10/29/16 1047  . losartan (COZAAR) tablet 50 mg  50 mg Oral QHS Clydie Braunondell A Smith, MD   50 mg at 10/28/16 2303  . morphine 2 MG/ML injection 1 mg  1 mg Intravenous Once PRN Leda GauzeKaren J Kirby-Graham, NP      . ondansetron [facility](ZOFRAN) injection 4 mg  4 mg Intravenous Q6H PRN Leda GauzeKaren J Kirby-Graham, NP      . pantoprazole (PROTONIX) injection 40 mg  40 mg Intravenous Q12H Maryann Mikhail, DO      . traMADol Janean Sark(ULTRAM) tablet 50 mg  50 mg Oral Once PRN Leda GauzeKaren J Kirby-Graham, NP      . vitamin B-12 (CYANOCOBALAMIN) tablet 100 mcg  100 mcg Oral Daily Rondell A Katrinka BlazingSmith, MD      . zinc oxide (BALMEX) 11.3 % cream   Topical BID Courteney Lyn Mackuen, MD        Allergies as of 10/28/2016 - Review Complete 10/28/2016  Allergen Reaction Noted  . Prednisone Other (See Comments) 03/02/2016    Family History  Problem Relation Age of Onset  . Alzheimer's disease Mother   . Aortic stenosis Father   . Heart disease Father   . Heart disease Son     Social History   Social History  . Marital status: Divorced    Spouse name: N/A  . Number of children: N/A  . Years of education: N/A   Occupational History  . RETIRED NURSE    Social History Main Topics  . Smoking status: Never Smoker  . Smokeless tobacco: Never Used  . Alcohol use No  . Drug use: No  . Sexual activity: Not on file   Other Topics Concern  . Not on file   Social History Narrative  . No narrative on file    Review of Systems: LE swelling, doe, knee pain  Physical Exam: Vital signs in last 24 hours: Temp:  [98.1 F (36.7 C)-98.8 F (37.1 C)] 98.8 F (37.1 C) (12/21 0815)  Pulse  Rate:  [66-77] 75 (12/21 0815) Resp:  [14-23] 15 (12/21 0815) BP: (136-186)/(46-84) 163/69 (12/21 0815) SpO2:  [90 %-100 %] 95 % (12/21 0815) Weight:  [72.8 kg (160 lb 9.6 oz)] 72.8 kg (160 lb 9.6 oz) (12/21 0436) Last BM Date: 10/29/16 General:    Elderly, frail-appearing, sitting up in bed, somewhat anxious but not frankly dyspneic at present. Head:  Normocephalic and atraumatic. Eyes:  Sclera clear, no icterus.   Lungs:  Clear  to auscultation.   No wheezes, crackles, or rhonchi. No accessory muscles. Heart:   Regular rate and rhythm; 3/6 harsh aortic systolic murmur, 2/6 soft blowing axillary systolic ?MR murmur Abdomen:  Adipose, no overt masses or tenderness Rectal:  Currently shows liquid black stool (no recent oral iron), w/ very small smudge of maroon    Extremities:  Moderate LE edema w/ weeping blisters Neurologic:  Alert and coherent;  grossly normal neurologically. Skin: No rashes, weeping blisters LE's Cervical Nodes:  No significant cervical adenopathy. Psych:  Appears anxious/depressed  Intake/Output from previous day: 12/20 0701 - 12/21 0700 In: 825 [P.O.:480; I.V.:10; Blood:335] Out: -  Intake/Output this shift: Total I/O In: 335 [Blood:335] Out: -   Lab Results:  Recent Labs  10/28/16 1545 10/29/16 0225 10/29/16 1024  WBC 10.2 9.1  --   HGB 7.6* 6.5* 7.1*  HCT 23.8* 19.8* 21.1*  PLT 293 262  --    BMET  Recent Labs  10/28/16 1545 10/29/16 0225  NA 134* 140  K 5.0 4.9  CL 99* 103  CO2 25 27  GLUCOSE 129* 103*  BUN 114* 131*  CREATININE 4.61* 4.86*  CALCIUM 8.4* 8.3*   LFT  Recent Labs  10/28/16 1545 10/29/16 0225  PROT 6.3*  --   ALBUMIN 3.1* 2.6*  AST 24  --   ALT 15  --   ALKPHOS 37*  --   BILITOT 1.1  --    PT/INR  Recent Labs  10/28/16 1545  LABPROT 16.8*  INR 1.35    Studies/Results: Dg Chest 2 View  Result Date: 10/28/2016 CLINICAL DATA:  Chest pain, anxiety, history coronary disease post CABG, hypertension,  stroke, acute diastolic heart failure, prior AVR EXAM: CHEST  2 VIEW COMPARISON:  11/13/2015 FINDINGS: Enlargement of cardiac silhouette post CABG and AVR. Pulmonary vascular congestion. BILATERAL pulmonary infiltrates favoring pulmonary edema and CHF. Atherosclerotic calcification aorta. Rotation to the LEFT on the AP view. No gross pleural effusion or pneumothorax. Bones demineralized. IMPRESSION: Probable CHF. Post CABG and AVR. Aortic atherosclerosis. Electronically Signed   By: Ulyses SouthwardMark  Boles M.D.   On: 10/28/2016 16:40    Impression: 1. Subacute UGI bleed, characterized by melena and now by coffee-ground emesis, suspect ASA gastropathy w/ ?? Contribution of uremic gastropathy 2. Post-hemorrhagic acute-on-chronic anemia 3. Multiple medical problems as outlined above  Plan: Have discussed w/ pt's 2 sons and daughter-in-law, and recommended medical support w/ PPI, monitoring of hgb, transfusion support.  Consider egd if acute/persistent/destabilizing bleeding despite PPI therapy, altho the risk of the procedure in this medically frail individual would be significant.  We will follow with you.   LOS: 1 day   Aliannah Holstrom V  10/29/2016, 4:24 PM   Pager [phone] If no answer or after 5 PM call [phone]

## 2016-10-29 NOTE — Progress Notes (Signed)
Patient coughing frequently. Nurse called into room, pt sitting on edge of bed with emesis on gown and floor. Brown in color. Pt denied nausea. Stated that the coughing made her throw up the chicken broth she had earlier. Cleaned and changed patient. Was able to tolerate Robitussin DM by mouth. Currently resting with sister at bedside. Will continue to monitor.

## 2016-10-29 NOTE — Procedures (Signed)
I have seen and examined this patient and agree with the plan of care.   BP 136/80   Goal  2500  AVF  -- infiltrated   No edema   Transfused 1 unit on dialysis  Will rest and dialyze   [facility] HospitalWEBB,Orion Vandervort W 10/29/2016, 8:24 AM

## 2016-10-29 NOTE — Consult Note (Signed)
CONSULT NOTE   MRN : [id number]  Reason for Consult: PAD Referring Physician: Clydie Braun, MD  History of Present Illness: 75 y/o female with bilateral LE edema, erythema toes to mid foot and peripheral neuropathy.  We have been consulted for PAD.  We have seen her in the past for left AV fistula creation 07/2016.  She has not been on HD.  She was admitted secondary to SOB and fatigue. She is ambulatory at home for short distances restricted by knee pain bilaterally with severe arthritis.  Pt was successful cannulated but reported twitched while waking up, leading to ininfiltration of her fistula.  They plan on HD tomorrow.  The patient is being seen by Cardiology for possible CAD/CHF.  She is also being seen by GI for possible sub-acute GI bleed.  She patient notes blistering in her R foot.  Past medical history includes: CAD with history of CABG 01/2010, TIA/CVA, HTN managed with Norvasc and Cozarr, and CKD/ESRD.  She has an intolerance to statins per chart review and denied DM.     Current Facility-Administered Medications  Medication Dose Route Frequency Provider Last Rate Last Dose  . acetaminophen (TYLENOL) tablet 650 mg  650 mg Oral Q6H PRN Clydie Braun, MD       Or  . acetaminophen (TYLENOL) suppository 650 mg  650 mg Rectal Q6H PRN Clydie Braun, MD      . albuterol (PROVENTIL) (2.5 MG/3ML) 0.083% nebulizer solution 2.5 mg  2.5 mg Nebulization Q2H PRN Clydie Braun, MD      . amLODipine (NORVASC) tablet 10 mg  10 mg Oral Daily Rondell A Katrinka Blazing, MD      . aspirin tablet 325 mg  325 mg Oral Daily Clydie Braun, MD   325 mg at 10/28/16 2302  . cholecalciferol (VITAMIN D) tablet 2,000 Units  2,000 Units Oral Daily Clydie Braun, MD   2,000 Units at 10/28/16 2303  . furosemide (LASIX) injection 20 mg  20 mg Intravenous Once Leda Gauze, NP      . furosemide (LASIX) tablet 120 mg  120 mg Oral QAC breakfast Rondell A Katrinka Blazing, MD      . furosemide (LASIX) tablet 80  mg  80 mg Oral QPM Rondell A Katrinka Blazing, MD      . gabapentin (NEURONTIN) capsule 300 mg  300 mg Oral Daily Clydie Braun, MD   300 mg at 10/28/16 2303  . guaiFENesin-dextromethorphan (ROBITUSSIN DM) 100-10 MG/5ML syrup 5 mL  5 mL Oral Q4H PRN Clydie Braun, MD   5 mL at 10/29/16 0035  . heparin injection 5,000 Units  5,000 Units Subcutaneous Q8H Rondell A Smith, MD      . levothyroxine (SYNTHROID, LEVOTHROID) tablet 112 mcg  112 mcg Oral QAC breakfast Rondell A Katrinka Blazing, MD      . losartan (COZAAR) tablet 50 mg  50 mg Oral QHS Clydie Braun, MD   50 mg at 10/28/16 2303  . morphine 2 MG/ML injection 1 mg  1 mg Intravenous Once PRN Leda Gauze, NP      . ondansetron [facility]) injection 4 mg  4 mg Intravenous Q6H PRN Leda Gauze, NP      . traMADol Janean Sark) tablet 50 mg  50 mg Oral Once PRN Leda Gauze, NP      . vitamin B-12 (CYANOCOBALAMIN) tablet 100 mcg  100 mcg Oral Daily Clydie Braun, MD      . zinc oxide (  BALMEX) 11.3 % cream   Topical BID Courteney Lyn Mackuen, MD        Pt meds include: Statin :No Betablocker: No ASA: Yes Other anticoagulants/antiplatelets: none  Past Medical History:  Diagnosis Date  . Anemia   . CAD (coronary artery disease)   . CFS (chronic fatigue syndrome)   . Chronic rhinitis   . Complication of anesthesia    "a little hard waking up"  . Constipation    due to iron  . Coronary atherosclerosis   . Depression   . Difficult intubation    "I have a small opening" When she had her CABG   . DJD (degenerative joint disease)   . Hyperlipidemia   . Hypertension   . Hypothyroidism   . Kidney stone   . MDD (major depressive disorder)   . Neuropathy (HCC)    in legs  . Obesity   . Osteopenia   . Renal insufficiency    Post Op  . Stroke [facility])    TIA, several    Past Surgical History:  Procedure Laterality Date  . ABDOMINAL HYSTERECTOMY  1976  . AORTIC VALVE REPLACEMENT    . AV FISTULA PLACEMENT Left 08/07/2016    Procedure: CREATION OF  LEFT UPPER ARM ARTERIOVENOUS (AV) FISTULA;  Surgeon: Larina Earthly, MD;  Location: [facility] OR;  Service: Vascular;  Laterality: Left;  . BACK SURGERY  1980's  . CARDIAC VALVE REPLACEMENT    . CORONARY ARTERY BYPASS GRAFT    . KNEE ARTHROSCOPY      Social History Social History  Substance Use Topics  . Smoking status: Never Smoker  . Smokeless tobacco: Never Used  . Alcohol use No    Family History Family History  Problem Relation Age of Onset  . Alzheimer's disease Mother   . Aortic stenosis Father   . Heart disease Father   . Heart disease Son     Allergies  Allergen Reactions  . Prednisone Other (See Comments)    Makes her loony     REVIEW OF SYSTEMS  General: [ ]  Weight loss, [ ]  Fever, [ ]  chills Neurologic: [ ]  Dizziness, [ ]  Blackouts, [ ]  Seizure [ ]  Stroke, [ ]  "Mini stroke", [ ]  Slurred speech, [ ]  Temporary blindness; [ ]  weakness in arms or legs, [ ]  Hoarseness [ ]  Dysphagia Cardiac: [ ]  Chest pain/pressure, [x ] Shortness of breath at rest [ ]  Shortness of breath with exertion, [ ]  Atrial fibrillation or irregular heartbeat  Vascular: [ ]  Pain in legs with walking, [ ]  Pain in legs at rest, [ ]  Pain in legs at night,  [ ]  Non-healing ulcer, [ ]  Blood clot in vein/DVT,   Pulmonary: [ ]  Home oxygen, [x ] Productive cough, [ ]  Coughing up blood, [ ]  Asthma,  [ ]  Wheezing [ ]  COPD Musculoskeletal:  [ ]  Arthritis, [ ]  Low back pain, [ ]  Joint pain Hematologic: [ ]  Easy Bruising, [x ] Anemia; [ ]  Hepatitis Gastrointestinal: [ ]  Blood in stool, [ ]  Gastroesophageal Reflux/heartburn, Urinary: [x ] chronic Kidney disease, [ ]  on HD - [ ]  MWF or [ ]  TTHS, [ ]  Burning with urination, [ ]  Difficulty urinating Skin: [ ]  Rashes, [ ]  Wounds Psychological: [ ]  Anxiety, [ ]  Depression  Physical Examination Vitals:   10/29/16 0535 10/29/16 0602 10/29/16 0800 10/29/16 0815  BP: (!) 153/48 (!) 154/49 136/60 (!) (P) 163/69  Pulse: 74 71 66 (P) 75  Resp:  18  14 15 (P) 15  Temp: 98.1 F (36.7 C) 98.3 F (36.8 C) 98.8 F (37.1 C) (P) 98.8 F (37.1 C)  TempSrc: Oral Oral Oral (P) Oral  SpO2: 96% 98% 95% (P) 95%  Weight:      Height:       Body mass index is 31.37 kg/m.  General:  WDWN in NAD, fatigued, ill appearing HENT: WNL Eyes: Pupils equal Pulmonary: tachypneic, shallow breathes, faint wheezes Cardiac: RRR, + murmur Murmurs, no rubs or gallops; No carotid bruits Abdomen: soft, NT, no masses Skin: Left foot with bluish/red discoloration scabs on dorsum of third and fourth toes.  Right medial lower leg with residual blister, no drainage or erythema Vascular Exam/Pulses:Palpable radial pulses bilaterally, femoral pulses palpable, doppler DP/PT bilaterally Musculoskeletal: no muscle wasting or atrophy; BLE minimal pitting edema  Neurologic: A&O X 3; Appropriate Affect ; SENSATION: normal; MOTOR FUNCTION: grossly 4+/5 Symmetric; Speech is fluent/normal Psychiatric: Judgment intact, Mood & affect appropriate for pt's clinical situation Lymph : No Cervical, Axillary, or Inguinal lymphadenopathy    Significant Diagnostic Studies: CBC Lab Results  Component Value Date   WBC 9.1 10/29/2016   HGB 6.5 (LL) 10/29/2016   HCT 19.8 (L) 10/29/2016   MCV 99.0 10/29/2016   PLT 262 10/29/2016    BMET    Component Value Date/Time   NA 140 10/29/2016 0225   K 4.9 10/29/2016 0225   CL 103 10/29/2016 0225   CO2 27 10/29/2016 0225   GLUCOSE 103 (H) 10/29/2016 0225   BUN 131 (H) 10/29/2016 0225   CREATININE 4.86 (H) 10/29/2016 0225   CALCIUM 8.3 (L) 10/29/2016 0225   CALCIUM 8.4 (L) 10/20/2016 0929   GFRNONAA 8 (L) 10/29/2016 0225   GFRAA 9 (L) 10/29/2016 0225   Estimated Creatinine Clearance: 8.6 mL/min (by C-G formula based on SCr of 4.86 mg/dL (H)).  COAG Lab Results  Component Value Date   INR 1.35 10/28/2016   INR 1.43 01/23/2010   INR 1.00 01/22/2010   Radiology: Dg Chest 2 View  Result Date: 10/28/2016 CLINICAL DATA:   Chest pain, anxiety, history coronary disease post CABG, hypertension, stroke, acute diastolic heart failure, prior AVR EXAM: CHEST  2 VIEW COMPARISON:  11/13/2015 FINDINGS: Enlargement of cardiac silhouette post CABG and AVR. Pulmonary vascular congestion. BILATERAL pulmonary infiltrates favoring pulmonary edema and CHF. Atherosclerotic calcification aorta. Rotation to the LEFT on the AP view. No gross pleural effusion or pneumothorax. Bones demineralized. IMPRESSION: Probable CHF. Post CABG and AVR. Aortic atherosclerosis. Electronically Signed   By: Ulyses SouthwardMark  Boles M.D.   On: 10/28/2016 16:40    Non-Invasive Vascular Imaging:   ARTERIAL  ABI completed: Unable to obtain left brachial pressure due to arteriovenous fistula. Unable to obtain left sided pressures due to a painful wound on the posterior aspect of the ankle. Unable to obtain bilateral toe pressures due to patient movement. Right ABI of 0.89 is suggestive of mild arterial occlusive disease at rest. However, due to the large disparity between the posterior tibial and dorsalis pedis values they may be inaccurate.   RIGHT   LEFT    PRESSURE WAVEFORM  PRESSURE WAVEFORM  BRACHIAL 183 Biphasic BRACHIAL  AVF  DP 72 Monophasic DP  Monophasic  PT 163 Monophasic PT  Monophasic    RIGHT LEFT  ABI 0.89    Arterial duplex complete. Unable to visualize the left common femoral artery, proximal superficial femoral artery, and profunda femoral artery due to patient pain tolerance.  Mild to moderate atherosclerotic plaque formations  noted throughout the right and left arterial system in the clearly visualized vessels. Bilateral calf vessels were difficult to visualize due to patient movement, pain tolerance, depth of vessels, and acoustic shadowing.   ASSESSMENT/PLAN:  CHF, ESRD requiring HD with infilitrated mature L BC AVF, BLE edema likely multifactorial, BLE mod-severe PAD  - Cellulitis left LE with toe ulcers - She has good  doppler signals bilateral LE's.  The frailty states the toes wounds have been present for 1 month, but are healing slowly.  At this time I feel that her circulation is adequate enough to heal her toe wounds.    Clinton GallantCOLLINS, EMMA [facility] 10/29/2016 9:07 AM   Addendum  I have independently interviewed and examined the patient, and I agree with the physician assistant's findings.  Pt is tachypneic with labored breathing.  If her cardiopulmonary status is not optimized, she likely will require intubation.  Additionally, she has an active GI bleed. Her PAD is the least of her problems at this point.  She has barely biphasic dorsalis pedis arteries on ABI so I doubt that she has critical limb ischemia at this point.  He wounds in her feet are not consistent with critical PAD rather they are more consistent with chronic venous insufficiency in the setting of CHF exacerbation.    - Pt is NOT medical stable so would not be a candidate for angiography currently.  Her non-invasive studies already suggest medial calcification so they would be inadequate to evaluate her vascularature. - Local wound to both feet and protect both feet - Patient can be seen in the office as an outpatient once stable to discuss scheduling an angiogram if her wounds have not resolved. - Rest left arm and resume use once hematoma has resolved.  Leonides SakeBrian Shantaya Bluestone, MD, FACS [facility] Office: [phone] Pager: [phone]  10/29/2016, 5:21 PM

## 2016-10-29 NOTE — Progress Notes (Addendum)
VASCULAR LAB PRELIMINARY  ARTERIAL  ABI completed: Unable to obtain left brachial pressure due to arteriovenous fistula. Unable to obtain left sided pressures due to a painful wound on the posterior aspect of the ankle. Unable to obtain bilateral toe pressures due to patient movement. Right ABI of 0.89 is suggestive of mild arterial occlusive disease at rest. However, due to the large disparity between the posterior tibial and dorsalis pedis values they may be inaccurate.   RIGHT    LEFT    PRESSURE WAVEFORM  PRESSURE WAVEFORM  BRACHIAL 183 Biphasic BRACHIAL  AVF  DP 72 Monophasic DP  Monophasic  PT 163 Monophasic PT  Monophasic    RIGHT LEFT  ABI 0.89    Arterial duplex complete. Unable to visualize the left common femoral artery, proximal superficial femoral artery, and profunda femoral artery due to patient pain tolerance.  Mild to moderate atherosclerotic plaque formations noted throughout the right and left arterial system in the clearly visualized vessels. Bilateral calf vessels were difficult to visualize due to patient movement, pain tolerance, depth of vessels, and acoustic shadowing.   Jill StainGregory J Dorr Johnson, RVT 10/29/2016, 1:20 PM

## 2016-10-30 ENCOUNTER — Other Ambulatory Visit (HOSPITAL_COMMUNITY): Payer: Commercial Managed Care - HMO

## 2016-10-30 DIAGNOSIS — R0789 Other chest pain: Secondary | ICD-10-CM

## 2016-10-30 LAB — HEPATITIS B SURFACE ANTIBODY, QUANTITATIVE: Hepatitis B-Post: >1000 m[IU]/mL (ref >9.9)

## 2016-10-30 LAB — RENAL FUNCTION PANEL
Albumin: 2.2 g/dL — ABNORMAL LOW (ref 3.5–5.0)
Anion gap: 14 (ref 5–15)
BUN: 157 mg/dL — AB (ref 6–20)
CHLORIDE: 104 mmol/L (ref 101–111)
CO2: 23 mmol/L (ref 22–32)
CREATININE: 4.83 mg/dL — AB (ref 0.44–1.00)
Calcium: 8.2 mg/dL — ABNORMAL LOW (ref 8.9–10.3)
GFR calc Af Amer: 9 mL/min — ABNORMAL LOW (ref >60)
GFR calc non Af Amer: 8 mL/min — ABNORMAL LOW (ref >60)
Glucose, Bld: 108 mg/dL — ABNORMAL HIGH (ref 65–99)
POTASSIUM: 4.6 mmol/L (ref 3.5–5.1)
Phosphorus: 5.5 mg/dL — ABNORMAL HIGH (ref 2.5–4.6)
Sodium: 141 mmol/L (ref 135–145)

## 2016-10-30 LAB — CBC
HEMATOCRIT: 16.6 % — AB (ref 36.0–46.0)
HEMATOCRIT: 25.4 % — AB (ref 36.0–46.0)
HEMOGLOBIN: 8.6 g/dL — AB (ref 12.0–15.0)
Hemoglobin: 5.4 g/dL — CL (ref 12.0–15.0)
MCH: 31.6 pg (ref 26.0–34.0)
MCH: 30.1 pg (ref 26.0–34.0)
MCHC: 32.5 g/dL (ref 30.0–36.0)
MCHC: 33.9 g/dL (ref 30.0–36.0)
MCV: 97.1 fL (ref 78.0–100.0)
MCV: 88.8 fL (ref 78.0–100.0)
PLATELETS: 196 10*3/uL (ref 150–400)
Platelets: 201 10*3/uL (ref 150–400)
RBC: 1.71 MIL/uL — ABNORMAL LOW (ref 3.87–5.11)
RBC: 2.86 MIL/uL — ABNORMAL LOW (ref 3.87–5.11)
RDW: 18.3 % — AB (ref 11.5–15.5)
RDW: 19.3 % — AB (ref 11.5–15.5)
WBC: 11.5 10*3/uL — ABNORMAL HIGH (ref 4.0–10.5)
WBC: 15.6 10*3/uL — ABNORMAL HIGH (ref 4.0–10.5)

## 2016-10-30 LAB — HEPATITIS B CORE ANTIBODY, TOTAL: Hep B Core Total Ab: NEGATIVE

## 2016-10-30 LAB — PARATHYROID HORMONE, INTACT (NO CA): PTH: 160 pg/mL — AB (ref 15–65)

## 2016-10-30 LAB — PREPARE RBC (CROSSMATCH)

## 2016-10-30 MED ORDER — LORAZEPAM 0.5 MG PO TABS
0.5000 mg | ORAL_TABLET | Freq: Four times a day (QID) | ORAL | Status: DC | PRN
  Administered 2016-10-30 – 2016-11-07 (×10): 0.5 mg via ORAL
  Filled 2016-10-30 (×11): qty 1

## 2016-10-30 MED ORDER — PENTAFLUOROPROP-TETRAFLUOROETH EX AERO: 1.0000 "application " | INHALATION_SPRAY | CUTANEOUS | Status: DC | PRN

## 2016-10-30 MED ORDER — ALTEPLASE 2 MG IJ SOLR: 2.0000 mg | Freq: Once | INTRAMUSCULAR | Status: DC | PRN

## 2016-10-30 MED ORDER — LIDOCAINE-PRILOCAINE 2.5-2.5 % EX CREA: 1.0000 "application " | TOPICAL_CREAM | CUTANEOUS | Status: DC | PRN

## 2016-10-30 MED ORDER — SODIUM CHLORIDE 0.9 % IV SOLN: 100.0000 mL | INTRAVENOUS | Status: DC | PRN

## 2016-10-30 MED ORDER — SODIUM CHLORIDE 0.9 % IV SOLN: Freq: Once | INTRAVENOUS | Status: DC

## 2016-10-30 MED ORDER — SUCRALFATE 1 GM/10ML PO SUSP
1.0000 g | Freq: Three times a day (TID) | ORAL | Status: AC
  Administered 2016-10-30 – 2016-11-02 (×11): 1 g via ORAL
  Filled 2016-10-30 (×10): qty 10

## 2016-10-30 MED ORDER — LIDOCAINE HCL (PF) 1 % IJ SOLN: 5.0000 mL | INTRAMUSCULAR | Status: DC | PRN

## 2016-10-30 MED ORDER — HEPARIN SODIUM (PORCINE) 1000 UNIT/ML DIALYSIS: 1000.0000 [IU] | INTRAMUSCULAR | Status: DC | PRN

## 2016-10-30 NOTE — Procedures (Signed)
I have seen and examined this patient and agree with the plan of care   BP 130/56   Goal 1.5 L   Hb 5.4    Ca 8.8  Phos 5.5 alb 2.2   No edema     Jill Johnson W 10/30/2016, 1:51 PM

## 2016-10-30 NOTE — Progress Notes (Signed)
   Received a call from the patient's nurse that she was unable to have a full HD session yesterday. Breathing is not at baseline. She is scheduled for 1st case HD today for further volume removal.  With her current volume status, she is not an ideal stress test candidate at this time and in reviewing Dr. Fabio BeringNishan's note from yesterday, this could be delayed secondary to her other medical conditions and pursued at a later date. Will cancel stress test for today.   Signed, Ellsworth LennoxBrittany M Charee Tumblin, PA-C 10/30/2016, 7:11 AM

## 2016-10-30 NOTE — Progress Notes (Signed)
Patient Name: Jill Johnson Date of Encounter: 10/30/2016  [facility]  Hospital Problem List     Principal Problem:   Fluid overload Active Problems:   S/P AVR (aortic valve replacement)   Chest pain   Elevated troponin   Symptomatic anemia   ESRD (end stage renal disease) (HCC)   Hypothyroidism   Respiratory distress     Subjective   Sleepy, responds to verbal, denies CP or SOB.  Inpatient Medications    Scheduled Meds: . sodium chloride   Intravenous Once  . sodium chloride   Intravenous Once  . amLODipine  10 mg Oral Daily  . cholecalciferol  2,000 Units Oral Daily  . furosemide  20 mg Intravenous Once  . furosemide  120 mg Oral QAC breakfast  . furosemide  80 mg Oral QPM  . gabapentin  300 mg Oral Daily  . levothyroxine  112 mcg Oral QAC breakfast  . losartan  50 mg Oral QHS  . pantoprazole (PROTONIX) IV  40 mg Intravenous Q12H  . vitamin B-12  100 mcg Oral Daily  . zinc oxide   Topical BID   Continuous Infusions:  PRN Meds: sodium chloride, sodium chloride, acetaminophen **OR** acetaminophen, albuterol, alteplase, guaiFENesin-dextromethorphan, heparin, lidocaine (PF), lidocaine-prilocaine, morphine injection, ondansetron, pentafluoroprop-tetrafluoroeth, traMADol   Vital Signs    Vitals:   10/30/16 0840 10/30/16 0900 10/30/16 0945 10/30/16 1000  BP: (!) 117/34 (!) 98/34 (!) 96/30 (!) 91/31  Pulse: (!) 59 62 60 60  Resp: 15 16 16 16   Temp:   97 F (36.1 C) 97.6 F (36.4 C)  TempSrc:   Axillary Oral  SpO2:   100%   Weight:      Height:        Intake/Output Summary (Last 24 hours) at 10/30/16 1005 Last data filed at 10/30/16 1000  Gross per 24 hour  Intake              575 ml  Output                3 ml  Net              572 ml   Filed Weights   10/28/16 1438 10/29/16 0436 10/30/16 0830  Weight: 163 lb (73.9 kg) 160 lb 9.6 oz (72.8 kg) 158 lb 4.6 oz (71.8 kg)    Physical Exam    GEN: Well nourished, well developed,  in no acute distress.  HEENT: Grossly normal.  Neck: Supple, JVD 9 cm, no carotid bruits, or masses. Cardiac: RRR, 2/6 murmurs, no rubs, or gallops. No clubbing, cyanosis, edema.  Radials/DP/PT 1-2+ and equal bilaterally.  Respiratory:  Respirations regular and unlabored, bibasilar rales  GI: Soft, nontender, nondistended, BS + x 4. MS: no deformity or atrophy. Skin: warm and dry, no rash. Neuro:  Strength and sensation are intact. Psych: A&Ox2.  Normal affect.  Labs    CBC  Recent Labs  10/28/16 1545 10/29/16 0225 10/29/16 1024 10/30/16 0800  WBC 10.2 9.1  --  11.5*  NEUTROABS 8.6*  --   --   --   HGB 7.6* 6.5* 7.1* 5.4*  HCT 23.8* 19.8* 21.1* 16.6*  MCV 102.1* 99.0  --  97.1  PLT 293 262  --  196   Basic Metabolic Panel  Recent Labs  10/29/16 0225 10/29/16 0835 10/30/16 0800  NA 140  --  141  K 4.9  --  4.6  CL 103  --  104  CO2  27  --  23  GLUCOSE 103*  --  108*  BUN 131*  --  157*  CREATININE 4.86*  --  4.83*  CALCIUM 8.3*  --  8.2*  MG  --  2.3  --   PHOS 4.3  --  5.5*   Liver Function Tests  Recent Labs  10/28/16 1545 10/29/16 0225 10/30/16 0800  AST 24  --   --   ALT 15  --   --   ALKPHOS 37*  --   --   BILITOT 1.1  --   --   PROT 6.3*  --   --   ALBUMIN 3.1* 2.6* 2.2*   Cardiac Enzymes  Recent Labs  10/28/16 2116 10/29/16 0225 10/29/16 0835  TROPONINI 0.06* 0.08* 0.08*    Telemetry    SR, Sinus brady - Personally Reviewed  ECG    n/a - Personally Reviewed  Radiology    Dg Chest 2 View  Result Date: 10/28/2016 CLINICAL DATA:  Chest pain, anxiety, history coronary disease post CABG, hypertension, stroke, acute diastolic heart failure, prior AVR EXAM: CHEST  2 VIEW COMPARISON:  11/13/2015 FINDINGS: Enlargement of cardiac silhouette post CABG and AVR. Pulmonary vascular congestion. BILATERAL pulmonary infiltrates favoring pulmonary edema and CHF. Atherosclerotic calcification aorta. Rotation to the LEFT on the AP view. No gross  pleural effusion or pneumothorax. Bones demineralized. IMPRESSION: Probable CHF. Post CABG and AVR. Aortic atherosclerosis. Electronically Signed   By: Ulyses SouthwardMark  Boles M.D.   On: 10/28/2016 16:40    Cardiac Studies   MV deferred  ABI and US LE 12/21 Summary: Sub-optimal study due to poor patient cooperation. Waveforms suggestive of moderate to severe disease in both feet. Other specific details can be found in the table(s) above. Prepared and Electronically Authenticated by Leonides SakeBrian Chen, MD   Patient Profile     75 y.o. year old female with a history of 21mm bioprosthetic AVR/CABG 01/2010 w/ LIMA to LAD, SVG to PDA, SVG to Diag 1, HLD w/ Crestor intolerance, ESRD s/p fistula HTN, hypothyroid, TIA/CVA. Admitted 12/20 w/ SOB, CHF, and chest pain. Volume management with HD.   Assessment & Plan    Principal Problem: 1.  Fluid overload - Per IM/Nephrology - getting HD today  2. Chest pain, elevated troponin - mild elevation in troponin not c/w ACS - CP resolved once respiratory status and H&H started to improve - MV planned for today, deferred due to pt SOB - MD advise on timing of MV  3.   S/P AVR (aortic valve replacement) - echo ordered, f/u on results  4.  Symptomatic anemia - for 2 U PRBCs today, per IM, Nephrology - anemia may have contributed to her CP  Otherwise, per IM Active Problems:   ESRD (end stage renal disease) (HCC)   Hypothyroidism   Respiratory distress   Signed, Theodore DemarkBarrett, Rhonda, PA-C  10/30/2016, 10:05 AM   Patient examined chart reviewed no rush for myovue as primary issue appears to be anemia And CRF with dialysis. Continue with transfusion volume regulation with dialysis fistula LUE  Charlton HawsPeter Purl Claytor

## 2016-10-30 NOTE — [place] FL2 (Addendum)
MEDICAID FL2 LEVEL OF CARE SCREENING TOOL     IDENTIFICATION  Patient Name: Jill Johnson Birthdate: 1941/05/23 Sex: female Admission Date (Current Location): 10/28/2016  [facility] [place]   Facility and Address:  [facility], [address], [place], [place] [address]      Provider Number: [id number]  Attending Physician Name and Address:  Edsel PetrinMaryann Mikhail, DO  Relative Name and Phone Number:       Current Level of Care: Hospital Recommended Level of Care: Skilled Nursing Facility Prior Approval Number:    Date Approved/Denied: 10/30/16 PASRR Number: [id number]  Discharge Plan: SNF    Current Diagnoses: Patient Active Problem List   Diagnosis Date Noted  . Chest pain 10/28/2016  . Fluid overload 10/28/2016  . Elevated troponin 10/28/2016  . Symptomatic anemia 10/28/2016  . ESRD (end stage renal disease) (HCC) 10/28/2016  . Hypothyroidism 10/28/2016  . Respiratory distress 10/28/2016  . Acute diastolic heart failure (HCC) 12/20/2015  . Obesity 12/20/2015  . Coronary artery disease due to lipid rich plaque 12/20/2015  . S/P AVR (aortic valve replacement) 12/20/2015  . Chronic kidney disease, stage 3 12/20/2015    Orientation RESPIRATION BLADDER Height & Weight     Place, Time, Self  Normal Incontinent Weight: 156 lb 8.4 oz (71 kg) Height:  5' (152.4 cm)  BEHAVIORAL SYMPTOMS/MOOD NEUROLOGICAL BOWEL NUTRITION STATUS      Continent  (Please see discharge summary)  AMBULATORY STATUS COMMUNICATION OF NEEDS Skin   Limited Assist Verbally Skin abrasions (Open wound left buttocks)                       Personal Care Assistance Level of Assistance  Bathing, Feeding, Dressing Bathing Assistance: Limited assistance Feeding assistance: Independent Dressing Assistance: Limited assistance     Functional Limitations Info  Sight, Hearing, Speech Sight Info: Adequate Hearing Info: Adequate Speech Info:  Adequate    SPECIAL CARE FACTORS FREQUENCY  PT (By licensed PT), OT (By licensed OT)                    Contractures Contractures Info: Not present    Additional Factors Info  Code Status, Allergies Code Status Info: Full Allergies Info: Prednisone           Current Medications (10/30/2016):  This is the current hospital active medication list Current Facility-Administered Medications  Medication Dose Route Frequency Provider Last Rate Last Dose  . 0.9 %  sodium chloride infusion   Intravenous Once Elvis CoilMartin Webb, MD      . 0.9 %  sodium chloride infusion   Intravenous Once Elvis CoilMartin Webb, MD      . acetaminophen (TYLENOL) tablet 650 mg  650 mg Oral Q6H PRN Clydie Braunondell A Smith, MD       Or  . acetaminophen (TYLENOL) suppository 650 mg  650 mg Rectal Q6H PRN Clydie Braunondell A Smith, MD      . albuterol (PROVENTIL) (2.5 MG/3ML) 0.083% nebulizer solution 2.5 mg  2.5 mg Nebulization Q2H PRN Clydie Braunondell A Smith, MD      . amLODipine (NORVASC) tablet 10 mg  10 mg Oral Daily Clydie Braunondell A Smith, MD   10 mg at 10/30/16 1333  . cholecalciferol (VITAMIN D) tablet 2,000 Units  2,000 Units Oral Daily Clydie Braunondell A Smith, MD   2,000 Units at 10/28/16 2303  . furosemide (LASIX) injection 20 mg  20 mg Intravenous Once Leda GauzeKaren J Kirby-Graham, NP      .  furosemide (LASIX) tablet 120 mg  120 mg Oral QAC breakfast Rondell A Katrinka BlazingSmith, MD   120 mg at 10/29/16 1047  . furosemide (LASIX) tablet 80 mg  80 mg Oral QPM Rondell Burtis JunesA Smith, MD   80 mg at 10/29/16 1727  . gabapentin (NEURONTIN) capsule 300 mg  300 mg Oral Daily Clydie Braunondell A Smith, MD   300 mg at 10/30/16 1333  . guaiFENesin-dextromethorphan (ROBITUSSIN DM) 100-10 MG/5ML syrup 5 mL  5 mL Oral Q4H PRN Clydie Braunondell A Smith, MD   5 mL at 10/29/16 0035  . levothyroxine (SYNTHROID, LEVOTHROID) tablet 112 mcg  112 mcg Oral QAC breakfast Clydie Braunondell A Smith, MD   112 mcg at 10/29/16 1047  . LORazepam (ATIVAN) tablet 0.5 mg  0.5 mg Oral Q6H PRN Maryann Mikhail, DO   0.5 mg at 10/30/16 1534  .  losartan (COZAAR) tablet 50 mg  50 mg Oral QHS Clydie Braunondell A Smith, MD   50 mg at 10/29/16 2230  . morphine 2 MG/ML injection 1 mg  1 mg Intravenous Once PRN Leda GauzeKaren J Kirby-Graham, NP      . ondansetron [facility](ZOFRAN) injection 4 mg  4 mg Intravenous Q6H PRN Leda GauzeKaren J Kirby-Graham, NP   4 mg at 10/30/16 1342  . pantoprazole (PROTONIX) injection 40 mg  40 mg Intravenous Q12H Maryann Mikhail, DO   40 mg at 10/30/16 1255  . sucralfate (CARAFATE) 1 GM/10ML suspension 1 g  1 g Oral TID WC & HS Bernette Redbirdobert Buccini, MD   1 g at 10/30/16 1333  . traMADol (ULTRAM) tablet 50 mg  50 mg Oral Once PRN Leda GauzeKaren J Kirby-Graham, NP      . vitamin B-12 (CYANOCOBALAMIN) tablet 100 mcg  100 mcg Oral Daily Rondell A Katrinka BlazingSmith, MD      . zinc oxide (BALMEX) 11.3 % cream   Topical BID Courteney Lyn Mackuen, MD         Discharge Medications: Please see discharge summary for a list of discharge medications.  Relevant Imaging Results:  Relevant Lab Results:   Additional Information SSN: [id number]   [facility]. Schedule and chair time to be confirmed.   Reuel BoomBridget A Mayton, LCSW

## 2016-10-30 NOTE — Consult Note (Signed)
WOC Nurse wound consult note Reason for Consult: intertriginous dermatitis and LE blister Wound type: ITD under breast and right groin, most likely yeast Blister left medial malleolus related to volume overload Measurement: aproximately 4cm x 4cm serous filled blister left medial LE Wound RUE:[id number]:blister has partially ruptured and drained, erythema under the breast and right groin, skin however intact Drainage (amount, consistency, odor) serous LLE Periwound: intact  Dressing procedure/placement/frequency: Add antimicrobial wicking textile under the breast and pannus. Change every 5 days.  Cover blistered area with silicone foam, change every 3 days.  Patient is noted to have mottled skin of the left toes and some toe tip eschar, VVS is following these wounds.   Discussed POC with patient and bedside nurse.  Re consult if needed, will not follow at this time. Thanks  Latesha Chesney M.D.[employer] MSN, RN,CWOCN, CNS [phone])

## 2016-10-30 NOTE — Progress Notes (Signed)
PROGRESS NOTE    Jill Johnson  ZOX:[id number] DOB: 1940-12-25 DOA: 10/28/2016 PCP: Hollice EspyGATES,DONNA RUTH, MD   Chief Complaint  Patient presents with  . Shortness of Breath    anxiety     Brief Narrative:  HPI on 10/28/2016 by Dr. Madelyn Flavorsondell Smith Jill Johnson is a 75 y.o. female with medical history significant of HTN, HLD, CKD, CAD s/p CABG, AVR, anemia, depression, hypothyroidism; who presents with complaints of shortness of breath. History is obtained by the patient's son as the patient is currently upset and unable to gather herself to provide adequate history. Patient had just recently been seen by her nephrologist Dr. Marisue HumbleSanford yesterday, and was told that her fistula was ready to be used and that it was time to start dialysis. However, the patient wanted to wait until after Christmas. Since that time the patient has had complaints of intermittent chest pain and been very anxious stating that she cannot breathe. She describes the chest pain more so as pressure and tightness. Associated symptoms included increasing swelling in her bilateral lower extremities, more depressed, and easily fatigued. Patient has had no loss of consciousness, focal weakness, changes in vision, abdominal pain, nausea, vomiting, or fever. At this time she still makes urine, but the son is unsure if she took her Lasix this morning. Assessment & Plan   Acute respiratory distress -Likely secondary to hypervolemia secondary to ESRD vs anemia, with a component of anxiety -Presented with shortness of breath, elevated BNP, CXR showing probable CHF -Nephrology consulted and appreciated -Suspect will improve with HD and diuresis -Continue supplemental oxygen to maintain O2 sats >92% -Continue nebs PRN  -Will add on ativan PRN  Symptomatic anemia/Anemia of chronic disease/Possible GI bleed -hemoglobin upon admission 7.6, baseline 8-9 -Hemoglobin dropped overnight to 6.5, given 1uPRBC -hemoglobin this morning 5.4,  additional 2uRBCs ordered for transfusion -FOBT + -Gastroenterology consulted and appreciated. Conservative management at this time given patient's current condition -patient has not had a colonoscopy or endoscopy  -Continue PPI -Aspirin and heparin discontinued  Chest pain with elevated troponin -Suspect related to demand ischemia. -Troponins 0.04 - 0.06 - 0.08 -Cardiology consulted and appreciated, echocardiogram pending -Stress test canceled for today  Anxiety/depression -Would benefit from SSRI -Will order Ativan PRN  Coronary artery disease s/p CABG/AVR -Continue aspirin  Essential hypertension -Continue Cozaar and amlodipine  Peripheral neuropathy/toe wounds -Suspect some aspect of peripheral vascular disease due to decreased circulation of lower extremities.  -Continue gabapentin -ABI and/or VVS consult recommended per nephrology   Hypothyroidism -Continue levothyroxine  DVT Prophylaxis  SCDs  Code Status: Full  Family Communication: None at bedside  Disposition Plan: Admitted  Consultants Cardiology Nephrology Vascular surgery Gastroenterology   Procedures  None  Antibiotics   Anti-infectives    None      Subjective:   Jill Johnson seen and examined today in hemodialysis.  Complains of feeling weak and sleepy.   Feels her breathing has not improved. Complains of anxiety. Denies abdominal pain, N/V/D/C.  Objective:   Vitals:   10/30/16 1015 10/30/16 1030 10/30/16 1045 10/30/16 1115  BP: (!) 113/41 (!) 139/29 (!) 114/35 (!) 118/37  Pulse: 62 (!) 59 (!) 59 (!) 57  Resp: 15 16 16 17   Temp: 97.6 F (36.4 C) 97.5 F (36.4 C) 97.5 F (36.4 C) 97.6 F (36.4 C)  TempSrc: Oral Oral Oral Oral  SpO2:    100%  Weight:    71 kg (156 lb 8.4 oz)  Height:  Intake/Output Summary (Last 24 hours) at 10/30/16 1314 Last data filed at 10/30/16 1115  Gross per 24 hour  Intake              880 ml  Output             1503 ml  Net              -623 ml   Filed Weights   10/29/16 0436 10/30/16 0830 10/30/16 1115  Weight: 72.8 kg (160 lb 9.6 oz) 71.8 kg (158 lb 4.6 oz) 71 kg (156 lb 8.4 oz)    Exam  General: Well developed, well nourished, NAD, appears stated age  HEENT: NCAT, mucous membranes moist.   Cardiovascular: S1 S2 auscultated, 2/6 SEM, RRR  Respiratory: Clear to auscultation bilaterally with equal chest rise  Abdomen: Soft, nontender, nondistended, + bowel sounds  Extremities: warm dry without cyanosis clubbing. +LE edema. Blisters on RLE   Neuro: AAOx3, nonfocal  Psych: Anxious, but appropriate  Data Reviewed: I have personally reviewed following labs and imaging studies  CBC:  Recent Labs Lab 10/28/16 1545 10/29/16 0225 10/29/16 1024 10/30/16 0800  WBC 10.2 9.1  --  11.5*  NEUTROABS 8.6*  --   --   --   HGB 7.6* 6.5* 7.1* 5.4*  HCT 23.8* 19.8* 21.1* 16.6*  MCV 102.1* 99.0  --  97.1  PLT 293 262  --  196   Basic Metabolic Panel:  Recent Labs Lab 10/28/16 1545 10/29/16 0225 10/29/16 0835 10/30/16 0800  NA 134* 140  --  141  K 5.0 4.9  --  4.6  CL 99* 103  --  104  CO2 25 27  --  23  GLUCOSE 129* 103*  --  108*  BUN 114* 131*  --  157*  CREATININE 4.61* 4.86*  --  4.83*  CALCIUM 8.4* 8.3*  --  8.2*  MG  --   --  2.3  --   PHOS  --  4.3  --  5.5*   GFR: Estimated Creatinine Clearance: 8.6 mL/min (by C-G formula based on SCr of 4.83 mg/dL (H)). Liver Function Tests:  Recent Labs Lab 10/28/16 1545 10/29/16 0225 10/30/16 0800  AST 24  --   --   ALT 15  --   --   ALKPHOS 37*  --   --   BILITOT 1.1  --   --   PROT 6.3*  --   --   ALBUMIN 3.1* 2.6* 2.2*   No results for input(s): LIPASE, AMYLASE in the last 168 hours. No results for input(s): AMMONIA in the last 168 hours. Coagulation Profile:  Recent Labs Lab 10/28/16 1545  INR 1.35   Cardiac Enzymes:  Recent Labs Lab 10/28/16 1545 10/28/16 2116 10/29/16 0225 10/29/16 0835  TROPONINI 0.04* 0.06* 0.08* 0.08*    BNP (last 3 results) No results for input(s): PROBNP in the last 8760 hours. HbA1C: No results for input(s): HGBA1C in the last 72 hours. CBG: No results for input(s): GLUCAP in the last 168 hours. Lipid Profile: No results for input(s): CHOL, HDL, LDLCALC, TRIG, CHOLHDL, LDLDIRECT in the last 72 hours. Thyroid Function Tests: No results for input(s): TSH, T4TOTAL, FREET4, T3FREE, THYROIDAB in the last 72 hours. Anemia Panel: No results for input(s): VITAMINB12, FOLATE, FERRITIN, TIBC, IRON, RETICCTPCT in the last 72 hours. Urine analysis:    Component Value Date/Time   COLORURINE YELLOW 10/28/2016 1600   APPEARANCEUR CLEAR 10/28/2016 1600   LABSPEC 1.011 10/28/2016 1600  PHURINE 6.5 10/28/2016 1600   GLUCOSEU NEGATIVE 10/28/2016 1600   HGBUR NEGATIVE 10/28/2016 1600   BILIRUBINUR NEGATIVE 10/28/2016 1600   KETONESUR NEGATIVE 10/28/2016 1600   PROTEINUR 30 (A) 10/28/2016 1600   UROBILINOGEN 1.0 01/30/2010 1104   NITRITE NEGATIVE 10/28/2016 1600   LEUKOCYTESUR NEGATIVE 10/28/2016 1600   Sepsis Labs: @LABRCNTIP (procalcitonin:4,lacticidven:4)  ) Recent Results (from the past 240 hour(s))  Urine culture     Status: None   Collection Time: 10/28/16  4:00 PM  Result Value Ref Range Status   Specimen Description URINE, CATHETERIZED  Final   Special Requests NONE  Final   Culture NO GROWTH Performed at [facility]   Final   Report Status 10/29/2016 FINAL  Final  MRSA PCR Screening     Status: None   Collection Time: 10/28/16  8:57 PM  Result Value Ref Range Status   MRSA by PCR NEGATIVE NEGATIVE Final    Comment:        The GeneXpert MRSA Assay (FDA approved for NASAL specimens only), is one component of a comprehensive MRSA colonization surveillance program. It is not intended to diagnose MRSA infection nor to guide or monitor treatment for MRSA infections.       Radiology Studies: Dg Chest 2 View  Result Date: 10/28/2016 CLINICAL DATA:  Chest  pain, anxiety, history coronary disease post CABG, hypertension, stroke, acute diastolic heart failure, prior AVR EXAM: CHEST  2 VIEW COMPARISON:  11/13/2015 FINDINGS: Enlargement of cardiac silhouette post CABG and AVR. Pulmonary vascular congestion. BILATERAL pulmonary infiltrates favoring pulmonary edema and CHF. Atherosclerotic calcification aorta. Rotation to the LEFT on the AP view. No gross pleural effusion or pneumothorax. Bones demineralized. IMPRESSION: Probable CHF. Post CABG and AVR. Aortic atherosclerosis. Electronically Signed   By: Ulyses Southward M.D.   On: 10/28/2016 16:40     Scheduled Meds: . sodium chloride   Intravenous Once  . sodium chloride   Intravenous Once  . amLODipine  10 mg Oral Daily  . cholecalciferol  2,000 Units Oral Daily  . furosemide  20 mg Intravenous Once  . furosemide  120 mg Oral QAC breakfast  . furosemide  80 mg Oral QPM  . gabapentin  300 mg Oral Daily  . levothyroxine  112 mcg Oral QAC breakfast  . losartan  50 mg Oral QHS  . pantoprazole (PROTONIX) IV  40 mg Intravenous Q12H  . sucralfate  1 g Oral TID WC & HS  . vitamin B-12  100 mcg Oral Daily  . zinc oxide   Topical BID   Continuous Infusions:   LOS: 2 days   Time Spent in minutes   30 minutes  Jill Johnson D.O. on 10/30/2016 at 1:14 PM  Between 7am to 7pm - Pager - [phone]  After 7pm go to [email or web address] - password [email or web address]  And look for the night coverage person covering for me after hours  [facility] Office  [phone]

## 2016-10-30 NOTE — Progress Notes (Signed)
GASTROENTEROLOGY PROGRESS NOTE  Problem:   Upper GI bleeding, presumably due to antecedent aspirin exposure.  History of aortic valve replacement, fluid overload, end stage renal disease recently started on dialysis.  Subjective: Patient seen while on dialysis.No shortness of breath.  Objective: Per discussion with the floor nurse, patient had 1 large dark bowel movement last night. Hemoglobin dropped from 7.1 to 5.4 over past 24 hours, without overt hemodynamic instability. Pressure was initially low on dialysis, but she is tolerating dialysis satisfactorily with transfusion of 2 units of packed cells (total of 3 units transfused this admission). Chest is clear, abdomen is soft and nontender. Unable to hear the aortic and mitral murmurs heard yesterday, due to ambient noise. Skin is warm and well perfused, patient appears more relaxed, lying flat, does not appear to be having respiratory difficulty like she was yesterday.  Assessment: Upper GI bleeding, most likely the result of aspirin gastropathy. Not clear if the drop in hemoglobin and dark stool last night are the aftermath of previous GI bleeding, or if they reflect some ongoing bleeding.  Plan: Continue observation and medical support (pantoprazole, and transfusions as needed). I will empirically add sucralfate suspension to the patient's regimen for the next several days.   As per yesterday's plan, would consider endoscopic evaluation in the event of acute/destabilizing, or ongoing, GI blood loss. However, because of this patient's medical frailty, I would try to avoid endoscopic evaluation if possible.  Florencia Reasonsobert V. Maury Bamba, M.D. 10/30/2016 11:47 AM  Pager [phone] If no answer or after 5 PM call [phone]

## 2016-10-30 NOTE — Progress Notes (Signed)
Pt complained of nausea and had a scant amount of emesis. Gave prn Zofran per order. Pt's nausea and vomiting subsided. Pt stated she felt "much better". Will continue to monitor and perform hourly rounding.

## 2016-10-31 ENCOUNTER — Inpatient Hospital Stay (HOSPITAL_COMMUNITY): Payer: Commercial Managed Care - HMO

## 2016-10-31 DIAGNOSIS — D649 Anemia, unspecified: Secondary | ICD-10-CM

## 2016-10-31 DIAGNOSIS — Z952 Presence of prosthetic heart valve: Secondary | ICD-10-CM

## 2016-10-31 DIAGNOSIS — N186 End stage renal disease: Secondary | ICD-10-CM

## 2016-10-31 DIAGNOSIS — R079 Chest pain, unspecified: Secondary | ICD-10-CM

## 2016-10-31 LAB — ECHOCARDIOGRAM COMPLETE
AO mean calculated velocity dopler: 248 cm/s
AOPV: 0.4 m/s
AV Area VTI index: 0.53 cm2/m2
AV Area VTI: 0.91 cm2
AV Mean grad: 29 mmHg
AV Peak grad: 51 mmHg
AV vel: 0.9
AVAREAMEANV: 0.89 cm2
AVAREAMEANVIN: 0.52 cm2/m2
AVCELMEANRAT: 0.39
AVLVOTPG: 8 mmHg
AVPKVEL: 356 cm/s
CHL CUP AV PEAK INDEX: 0.54
FS: 38 % (ref 28–44)
Height: 60 in
IV/PV OW: 1.02
LA diam index: 2.82 cm/m2
LASIZE: 48 mm
LAVOL: 114 mL
LAVOLA4C: 109 mL
LAVOLIN: 67.1 mL/m2
LEFT ATRIUM END SYS DIAM: 48 mm
LV TDI E'MEDIAL: 4.28
LV e' LATERAL: 5.35 cm/s
LVOT VTI: 39.8 cm
LVOT area: 2.27 cm2
LVOTD: 17 mm
LVOTPV: 143 cm/s
LVOTSV: 90 mL
LVOTVTI: 0.4 cm
Lateral S' vel: 11.8 cm/s
MRPISAEROA: 0.14 cm2
PW: 14.6 mm — AB (ref 0.6–1.1)
TAPSE: 26.3 mm
TDI e' lateral: 5.35
VTI: 100 cm
VTI: 192 cm
Valve area index: 0.53
Valve area: 0.9 cm2
Weight: 2571.45 oz

## 2016-10-31 LAB — CBC
HEMATOCRIT: 22.6 % — AB (ref 36.0–46.0)
HEMATOCRIT: 22.9 % — AB (ref 36.0–46.0)
HEMOGLOBIN: 7.7 g/dL — AB (ref 12.0–15.0)
Hemoglobin: 7.7 g/dL — ABNORMAL LOW (ref 12.0–15.0)
MCH: 30.7 pg (ref 26.0–34.0)
MCH: 30.4 pg (ref 26.0–34.0)
MCHC: 34.1 g/dL (ref 30.0–36.0)
MCHC: 33.6 g/dL (ref 30.0–36.0)
MCV: 90 fL (ref 78.0–100.0)
MCV: 90.5 fL (ref 78.0–100.0)
Platelets: 186 10*3/uL (ref 150–400)
Platelets: 202 10*3/uL (ref 150–400)
RBC: 2.51 MIL/uL — AB (ref 3.87–5.11)
RBC: 2.53 MIL/uL — ABNORMAL LOW (ref 3.87–5.11)
RDW: 19.8 % — ABNORMAL HIGH (ref 11.5–15.5)
RDW: 19.8 % — AB (ref 11.5–15.5)
WBC: 11.9 10*3/uL — ABNORMAL HIGH (ref 4.0–10.5)
WBC: 11.6 10*3/uL — AB (ref 4.0–10.5)

## 2016-10-31 LAB — BASIC METABOLIC PANEL
Anion gap: 12 (ref 5–15)
BUN: 88 mg/dL — AB (ref 6–20)
CHLORIDE: 100 mmol/L — AB (ref 101–111)
CO2: 26 mmol/L (ref 22–32)
Calcium: 8.1 mg/dL — ABNORMAL LOW (ref 8.9–10.3)
Creatinine, Ser: 3.77 mg/dL — ABNORMAL HIGH (ref 0.44–1.00)
GFR calc Af Amer: 12 mL/min — ABNORMAL LOW (ref >60)
GFR calc non Af Amer: 11 mL/min — ABNORMAL LOW (ref >60)
GLUCOSE: 94 mg/dL (ref 65–99)
POTASSIUM: 3.5 mmol/L (ref 3.5–5.1)
Sodium: 138 mmol/L (ref 135–145)

## 2016-10-31 MED ORDER — DARBEPOETIN ALFA 150 MCG/0.3ML IJ SOSY: 150.0000 ug | PREFILLED_SYRINGE | INTRAMUSCULAR | Status: DC

## 2016-10-31 NOTE — Progress Notes (Signed)
PROGRESS NOTE    Jill Perfectancy L Kostelnik  UXL:[id number]:[id number] DOB: 1941/09/23 DOA: 10/28/2016 PCP: Hollice EspyGATES,DONNA RUTH, MD   Chief Complaint  Patient presents with  . Shortness of Breath    anxiety     Brief Narrative:  HPI on 10/28/2016 by Dr. Madelyn Flavorsondell Smith Jill Johnson is a 75 y.o. female with medical history significant of HTN, HLD, CKD, CAD s/p CABG, AVR, anemia, depression, hypothyroidism; who presents with complaints of shortness of breath. History is obtained by the patient's son as the patient is currently upset and unable to gather herself to provide adequate history. Patient had just recently been seen by her nephrologist Dr. Marisue HumbleSanford yesterday, and was told that her fistula was ready to be used and that it was time to start dialysis. However, the patient wanted to wait until after Christmas. Since that time the patient has had complaints of intermittent chest pain and been very anxious stating that she cannot breathe. She describes the chest pain more so as pressure and tightness. Associated symptoms included increasing swelling in her bilateral lower extremities, more depressed, and easily fatigued. Patient has had no loss of consciousness, focal weakness, changes in vision, abdominal pain, nausea, vomiting, or fever. At this time she still makes urine, but the son is unsure if she took her Lasix this morning. Assessment & Plan   Acute respiratory distress -Appears to be improving  -Likely secondary to hypervolemia secondary to ESRD vs anemia, with a component of anxiety -Presented with shortness of breath, elevated BNP, CXR showing probable CHF -Nephrology consulted and appreciated -Suspect will improve with HD and diuresis -Continue supplemental oxygen to maintain O2 sats >92% -Continue nebs PRN, PRN ativan  Symptomatic anemia/Anemia of chronic disease/Possible GI bleed -hemoglobin upon admission 7.6, baseline 8-9 -hemoglobin dropped to 5.4, given 3uPRBCs -FOBT + -Gastroenterology  consulted and appreciated. Conservative management at this time given patient's current condition -patient has not had a colonoscopy or endoscopy  -Continue PPI -Aspirin and heparin discontinued  Chest pain with elevated troponin -Suspect related to demand ischemia vs anemia and overload -Troponins 0.04 - 0.06 - 0.08 -Cardiology consulted and appreciated, echocardiogram pending -Stress test canceled - do as an outpatient  Anxiety/depression -Would benefit from SSRI -Continue Ativan PRN  Coronary artery disease s/p CABG/AVR -Continue aspirin  Essential hypertension -Continue Cozaar and amlodipine  Peripheral neuropathy/toe wounds -Suspect some aspect of peripheral vascular disease due to decreased circulation of lower extremities.  -Continue gabapentin -ABI and/or VVS consult recommended per nephrology   Hypothyroidism -Continue levothyroxine  DVT Prophylaxis  SCDs  Code Status: Full  Family Communication: Family at bedside  Disposition Plan: Admitted.   Consultants Cardiology Nephrology Vascular surgery Gastroenterology   Procedures  None  Antibiotics   Anti-infectives    None      Subjective:   Jill Johnson seen and examined today. Feeling better this morning.  Upset that she is in the hospital around Christmas time. Feels her breathing has improved.  Denies currently chest pain or shortness of breath.  Denies abdominal pain, N/V/D/C.  Objective:   Vitals:   10/31/16 0400 10/31/16 0415 10/31/16 0643 10/31/16 0801  BP:   (!) 131/39 (!) 132/33  Pulse:   (!) 56 (!) 57  Resp:   14 18  Temp:   97.9 F (36.6 C) 97.9 F (36.6 C)  TempSrc:   Oral Oral  SpO2: 92% 97% 93% 98%  Weight:   72.9 kg (160 lb 11.5 oz)   Height:  Intake/Output Summary (Last 24 hours) at 10/31/16 1144 Last data filed at 10/31/16 0400  Gross per 24 hour  Intake              315 ml  Output                0 ml  Net              315 ml   Filed Weights   10/30/16  0830 10/30/16 1115 10/31/16 0643  Weight: 71.8 kg (158 lb 4.6 oz) 71 kg (156 lb 8.4 oz) 72.9 kg (160 lb 11.5 oz)    Exam  General: Well developed, well nourished, NAD, appears stated age  HEENT: NCAT, mucous membranes moist.   Cardiovascular: S1 S2 auscultated, 2/6 SEM, RRR  Respiratory: diminished but clear.  Abdomen: Soft, nontender, nondistended, + bowel sounds  Extremities: warm dry without cyanosis clubbing   Neuro: AAOx3, nonfocal  Psych:Appropriate  Data Reviewed: I have personally reviewed following labs and imaging studies  CBC:  Recent Labs Lab 10/28/16 1545 10/29/16 0225 10/29/16 1024 10/30/16 0800 10/30/16 1653 10/31/16 0450  WBC 10.2 9.1  --  11.5* 15.6* 11.9*  NEUTROABS 8.6*  --   --   --   --   --   HGB 7.6* 6.5* 7.1* 5.4* 8.6* 7.7*  HCT 23.8* 19.8* 21.1* 16.6* 25.4* 22.6*  MCV 102.1* 99.0  --  97.1 88.8 90.0  PLT 293 262  --  196 201 186   Basic Metabolic Panel:  Recent Labs Lab 10/28/16 1545 10/29/16 0225 10/29/16 0835 10/30/16 0800 10/31/16 0450  NA 134* 140  --  141 138  K 5.0 4.9  --  4.6 3.5  CL 99* 103  --  104 100*  CO2 25 27  --  23 26  GLUCOSE 129* 103*  --  108* 94  BUN 114* 131*  --  157* 88*  CREATININE 4.61* 4.86*  --  4.83* 3.77*  CALCIUM 8.4* 8.3*  --  8.2* 8.1*  MG  --   --  2.3  --   --   PHOS  --  4.3  --  5.5*  --    GFR: Estimated Creatinine Clearance: 11.1 mL/min (by C-G formula based on SCr of 3.77 mg/dL (H)). Liver Function Tests:  Recent Labs Lab 10/28/16 1545 10/29/16 0225 10/30/16 0800  AST 24  --   --   ALT 15  --   --   ALKPHOS 37*  --   --   BILITOT 1.1  --   --   PROT 6.3*  --   --   ALBUMIN 3.1* 2.6* 2.2*   No results for input(s): LIPASE, AMYLASE in the last 168 hours. No results for input(s): AMMONIA in the last 168 hours. Coagulation Profile:  Recent Labs Lab 10/28/16 1545  INR 1.35   Cardiac Enzymes:  Recent Labs Lab 10/28/16 1545 10/28/16 2116 10/29/16 0225 10/29/16 0835    TROPONINI 0.04* 0.06* 0.08* 0.08*   BNP (last 3 results) No results for input(s): PROBNP in the last 8760 hours. HbA1C: No results for input(s): HGBA1C in the last 72 hours. CBG: No results for input(s): GLUCAP in the last 168 hours. Lipid Profile: No results for input(s): CHOL, HDL, LDLCALC, TRIG, CHOLHDL, LDLDIRECT in the last 72 hours. Thyroid Function Tests: No results for input(s): TSH, T4TOTAL, FREET4, T3FREE, THYROIDAB in the last 72 hours. Anemia Panel: No results for input(s): VITAMINB12, FOLATE, FERRITIN, TIBC, IRON, RETICCTPCT in the last  72 hours. Urine analysis:    Component Value Date/Time   COLORURINE YELLOW 10/28/2016 1600   APPEARANCEUR CLEAR 10/28/2016 1600   LABSPEC 1.011 10/28/2016 1600   PHURINE 6.5 10/28/2016 1600   GLUCOSEU NEGATIVE 10/28/2016 1600   HGBUR NEGATIVE 10/28/2016 1600   BILIRUBINUR NEGATIVE 10/28/2016 1600   KETONESUR NEGATIVE 10/28/2016 1600   PROTEINUR 30 (A) 10/28/2016 1600   UROBILINOGEN 1.0 01/30/2010 1104   NITRITE NEGATIVE 10/28/2016 1600   LEUKOCYTESUR NEGATIVE 10/28/2016 1600   Sepsis Labs: @LABRCNTIP (procalcitonin:4,lacticidven:4)  ) Recent Results (from the past 240 hour(s))  Urine culture     Status: None   Collection Time: 10/28/16  4:00 PM  Result Value Ref Range Status   Specimen Description URINE, CATHETERIZED  Final   Special Requests NONE  Final   Culture NO GROWTH Performed at [facility]   Final   Report Status 10/29/2016 FINAL  Final  MRSA PCR Screening     Status: None   Collection Time: 10/28/16  8:57 PM  Result Value Ref Range Status   MRSA by PCR NEGATIVE NEGATIVE Final    Comment:        The GeneXpert MRSA Assay (FDA approved for NASAL specimens only), is one component of a comprehensive MRSA colonization surveillance program. It is not intended to diagnose MRSA infection nor to guide or monitor treatment for MRSA infections.       Radiology Studies: No results found.   Scheduled  Meds: . sodium chloride   Intravenous Once  . sodium chloride   Intravenous Once  . amLODipine  10 mg Oral Daily  . cholecalciferol  2,000 Units Oral Daily  . furosemide  20 mg Intravenous Once  . furosemide  120 mg Oral QAC breakfast  . furosemide  80 mg Oral QPM  . gabapentin  300 mg Oral Daily  . levothyroxine  112 mcg Oral QAC breakfast  . losartan  50 mg Oral QHS  . pantoprazole (PROTONIX) IV  40 mg Intravenous Q12H  . sucralfate  1 g Oral TID WC & HS  . vitamin B-12  100 mcg Oral Daily  . zinc oxide   Topical BID   Continuous Infusions:   LOS: 3 days   Time Spent in minutes   30 minutes  Vernon Ariel D.O. on 10/31/2016 at 11:44 AM  Between 7am to 7pm - Pager - [phone]  After 7pm go to [email or web address] - password [email or web address]  And look for the night coverage person covering for me after hours  [facility] Office  [phone]

## 2016-10-31 NOTE — Progress Notes (Signed)
  Echocardiogram 2D Echocardiogram has been performed.  Jill Johnson, Jill Johnson 10/31/2016, 10:49 AM

## 2016-10-31 NOTE — Progress Notes (Signed)
[facility] ROUNDING NOTE   Subjective:   Interval History:  No complaints this morning  Dialysis successful through AVF    Objective:  Vital signs in last 24 hours:  Temp:  [97.4 F (36.3 C)-98.2 F (36.8 C)] 97.4 F (36.3 C) (12/23 1435) Pulse Rate:  [46-73] 46 (12/23 1435) Resp:  [14-23] 18 (12/23 1435) BP: (124-155)/(33-46) 124/34 (12/23 1435) SpO2:  [92 %-100 %] 98 % (12/23 1435) Weight:  [72.9 kg (160 lb 11.5 oz)] 72.9 kg (160 lb 11.5 oz) (12/23 0643)  Weight change:  Filed Weights   10/30/16 0830 10/30/16 1115 10/31/16 0643  Weight: 71.8 kg (158 lb 4.6 oz) 71 kg (156 lb 8.4 oz) 72.9 kg (160 lb 11.5 oz)    Intake/Output: I/O last 3 completed shifts: In: 1195 [P.O.:555; Blood:640] Out: 1502 [Urine:2; Other:1500]   Intake/Output this shift:  No intake/output data recorded.  CVS- RRR RS- CTA 2/6 SEM  ABD- BS present soft non-distended EXT-  1+  edema   Basic Metabolic Panel:  Recent Labs Lab 10/28/16 1545 10/29/16 0225 10/29/16 0835 10/30/16 0800 10/31/16 0450  NA 134* 140  --  141 138  K 5.0 4.9  --  4.6 3.5  CL 99* 103  --  104 100*  CO2 25 27  --  23 26  GLUCOSE 129* 103*  --  108* 94  BUN 114* 131*  --  157* 88*  CREATININE 4.61* 4.86*  --  4.83* 3.77*  CALCIUM 8.4* 8.3*  --  8.2* 8.1*  MG  --   --  2.3  --   --   PHOS  --  4.3  --  5.5*  --     Liver Function Tests:  Recent Labs Lab 10/28/16 1545 10/29/16 0225 10/30/16 0800  AST 24  --   --   ALT 15  --   --   ALKPHOS 37*  --   --   BILITOT 1.1  --   --   PROT 6.3*  --   --   ALBUMIN 3.1* 2.6* 2.2*   No results for input(s): LIPASE, AMYLASE in the last 168 hours. No results for input(s): AMMONIA in the last 168 hours.  CBC:  Recent Labs Lab 10/28/16 1545 10/29/16 0225 10/29/16 1024 10/30/16 0800 10/30/16 1653 10/31/16 0450  WBC 10.2 9.1  --  11.5* 15.6* 11.9*  NEUTROABS 8.6*  --   --   --   --   --   HGB 7.6* 6.5* 7.1* 5.4* 8.6* 7.7*  HCT 23.8* 19.8*  21.1* 16.6* 25.4* 22.6*  MCV 102.1* 99.0  --  97.1 88.8 90.0  PLT 293 262  --  196 201 186    Cardiac Enzymes:  Recent Labs Lab 10/28/16 1545 10/28/16 2116 10/29/16 0225 10/29/16 0835  TROPONINI 0.04* 0.06* 0.08* 0.08*    BNP: Invalid input(s): POCBNP  CBG: No results for input(s): GLUCAP in the last 168 hours.  Microbiology: Results for orders placed or performed during the hospital encounter of 10/28/16  Urine culture     Status: None   Collection Time: 10/28/16  4:00 PM  Result Value Ref Range Status   Specimen Description URINE, CATHETERIZED  Final   Special Requests NONE  Final   Culture NO GROWTH [facility]   Final   Report Status 10/29/2016 FINAL  Final  MRSA PCR Screening     Status: None   Collection Time: 10/28/16  8:57 PM  Result Value Ref Range  Status   MRSA by PCR NEGATIVE NEGATIVE Final    Comment:        The GeneXpert MRSA Assay (FDA approved for NASAL specimens only), is one component of a comprehensive MRSA colonization surveillance program. It is not intended to diagnose MRSA infection nor to guide or monitor treatment for MRSA infections.     Coagulation Studies:  Recent Labs  10/28/16 1545  LABPROT 16.8*  INR 1.35    Urinalysis:  Recent Labs  10/28/16 1600  COLORURINE YELLOW  LABSPEC 1.011  PHURINE 6.5  GLUCOSEU NEGATIVE  HGBUR NEGATIVE  BILIRUBINUR NEGATIVE  KETONESUR NEGATIVE  PROTEINUR 30*  NITRITE NEGATIVE  LEUKOCYTESUR NEGATIVE      Imaging: No results found.   Medications:    . sodium chloride   Intravenous Once  . sodium chloride   Intravenous Once  . amLODipine  10 mg Oral Daily  . cholecalciferol  2,000 Units Oral Daily  . furosemide  20 mg Intravenous Once  . furosemide  120 mg Oral QAC breakfast  . furosemide  80 mg Oral QPM  . gabapentin  300 mg Oral Daily  . levothyroxine  112 mcg Oral QAC breakfast  . losartan  50 mg Oral QHS  . pantoprazole (PROTONIX) IV  40 mg  Intravenous Q12H  . sucralfate  1 g Oral TID WC & HS  . vitamin B-12  100 mcg Oral Daily  . zinc oxide   Topical BID   acetaminophen **OR** acetaminophen, albuterol, guaiFENesin-dextromethorphan, LORazepam, morphine injection, ondansetron  Assessment/ Plan:  HPI on 10/28/2016 by Dr. Selmer Dominionondell Smith Jill Johnson a 75 y.o.femalewith medical history significant of HTN, HLD, CKD, CAD s/p CABG, AVR, anemia, depression, hypothyroidism;who presents with complaints of shortness of breath.  1. End stage Renal disease   Successful first dialysis 12/22   Appears to be tolerating well. Will plan next dialysis 12/24 2. Volume appears well controlled  Will discontinue lasix  3. Anemia  Hb 7.7   Will give another dose of Aranesp 4. Bones  PTH 120   Continues on cholecalciferol 5. Hypothyroidism  She was taking levothyroxine   LOS: 3 Jill Johnson @Jill Johnson @2 :46 PM

## 2016-10-31 NOTE — Progress Notes (Signed)
The patient states that she is feeling better. There have been no signs to indicate any destabilizing gastrointestinal bleeding. Dr.Buccini's note was reviewed. Conservative medical management planned at this time. No reason at this time for endoscopic intervention. We will sign off. Call us if needed.

## 2016-10-31 NOTE — Plan of Care (Signed)
Problem: Pain Managment: Goal: General experience of comfort will improve Outcome: Progressing Assisted pt out of bed to chair, encouraged pt to reposition Q2 hours for comfort and to prevent break down  Problem: Activity: Goal: Risk for activity intolerance will decrease Outcome: Progressing PT/OT consult

## 2016-10-31 NOTE — Progress Notes (Signed)
=p=  Patient Name: Jill Johnson Date of Encounter: 10/31/2016  [facility]  Hospital Problem List     Principal Problem:   Fluid overload Active Problems:   S/P AVR (aortic valve replacement)   Chest pain   Elevated troponin   Symptomatic anemia   ESRD (end stage renal disease) (HCC)   Hypothyroidism   Respiratory distress     Subjective   She states "I am miserable because its Christmas and I am sick". Denies CP.  Inpatient Medications    Scheduled Meds: . sodium chloride   Intravenous Once  . sodium chloride   Intravenous Once  . amLODipine  10 mg Oral Daily  . cholecalciferol  2,000 Units Oral Daily  . furosemide  20 mg Intravenous Once  . furosemide  120 mg Oral QAC breakfast  . furosemide  80 mg Oral QPM  . gabapentin  300 mg Oral Daily  . levothyroxine  112 mcg Oral QAC breakfast  . losartan  50 mg Oral QHS  . pantoprazole (PROTONIX) IV  40 mg Intravenous Q12H  . sucralfate  1 g Oral TID WC & HS  . vitamin B-12  100 mcg Oral Daily  . zinc oxide   Topical BID     Vital Signs    Vitals:   10/31/16 0400 10/31/16 0415 10/31/16 0643 10/31/16 0801  BP:   (!) 131/39 (!) 132/33  Pulse:   (!) 56 (!) 57  Resp:   14 18  Temp:   97.9 F (36.6 C) 97.9 F (36.6 C)  TempSrc:   Oral Oral  SpO2: 92% 97% 93% 98%  Weight:   160 lb 11.5 oz (72.9 kg)   Height:        Intake/Output Summary (Last 24 hours) at 10/31/16 0901 Last data filed at 10/31/16 0400  Gross per 24 hour  Intake              955 ml  Output             1500 ml  Net             -545 ml   Filed Weights   10/30/16 0830 10/30/16 1115 10/31/16 0643  Weight: 158 lb 4.6 oz (71.8 kg) 156 lb 8.4 oz (71 kg) 160 lb 11.5 oz (72.9 kg)    Physical Exam    GEN: Well nourished, well developed, in no acute distress.  HEENT: Grossly normal.  Neck: Supple, JVD 9 cm, no carotid bruits, or masses. Cardiac: RRR, 2/6 murmurs, no rubs, or gallops. No clubbing, cyanosis, edema.   Radials/DP/PT 1-2+ and equal bilaterally.  Respiratory:  Respirations regular and unlabored, bibasilar rales  GI: Soft, nontender, nondistended, BS + x 4. MS: no deformity or atrophy. Skin: warm and dry, no rash. Neuro:  Strength and sensation are intact. Psych: A&Ox2.  Normal affect.  Labs    CBC  Recent Labs  10/28/16 1545  10/30/16 1653 10/31/16 0450  WBC 10.2  < > 15.6* 11.9*  NEUTROABS 8.6*  --   --   --   HGB 7.6*  < > 8.6* 7.7*  HCT 23.8*  < > 25.4* 22.6*  MCV 102.1*  < > 88.8 90.0  PLT 293  < > 201 186  < > = values in this interval not displayed. Basic Metabolic Panel  Recent Labs  10/29/16 0225 10/29/16 0835 10/30/16 0800 10/31/16 0450  NA 140  --  141 138  K 4.9  --  4.6 3.5  CL 103  --  104 100*  CO2 27  --  23 26  GLUCOSE 103*  --  108* 94  BUN 131*  --  157* 88*  CREATININE 4.86*  --  4.83* 3.77*  CALCIUM 8.3*  --  8.2* 8.1*  MG  --  2.3  --   --   PHOS 4.3  --  5.5*  --    Liver Function Tests  Recent Labs  10/28/16 1545 10/29/16 0225 10/30/16 0800  AST 24  --   --   ALT 15  --   --   ALKPHOS 37*  --   --   BILITOT 1.1  --   --   PROT 6.3*  --   --   ALBUMIN 3.1* 2.6* 2.2*   Cardiac Enzymes  Recent Labs  10/28/16 2116 10/29/16 0225 10/29/16 0835  TROPONINI 0.06* 0.08* 0.08*    Telemetry    SR, Sinus brady with PVCs  Patient Profile     75 y.o. year old female with a history of 21mm bioprosthetic AVR/CABG 01/2010 w/ LIMA to LAD, SVG to PDA, SVG to Diag 1, HLD w/ Crestor intolerance, ESRD s/p fistula HTN, hypothyroid, TIA/CVA. Admitted 12/20 w/ SOB, CHF, and chest pain. Volume management with HD.   Assessment & Plan    Principal Problem: 1.  Fluid overload secondary to advanced renal failure.  Not a primary CV event - Per IM/Nephrology  2. Atypical Chest pain  - mild elevation in troponin not c/w ACS - CP resolved once respiratory status and H&H started to improve - would reassess after anemia is addressed - consider  outpatient myoview - would not workup in hospital unless symptoms worsen  3.   S/P AVR (aortic valve replacement) - echo remains pending  4.  Symptomatic anemia - would transfuse to hct of 30 given recent chest pain unless contraindicated - primary team evaluating  Otherwise, per IM Active Problems:   ESRD (end stage renal disease) (HCC)   Hypothyroidism   Respiratory distress  No acute CV issues  Cardiology to see as needed while here Will need outpatient follow-up with Dr Anne FuSkains to assess feasibility of outpatient stress testing.  Please call with questions.  Hillis RangeJames Caralina Nop MD, [facility] 10/31/2016 9:06 AM

## 2016-10-31 NOTE — Plan of Care (Signed)
Problem: Pain Managment: Goal: General experience of comfort will improve Outcome: Progressing Pt woke up from sleep with 5/10 pain. Pt complained of cramping of feet. Asked pt if she wanted pain medication for the cramping and she stated that she did. Medication given for moderate pain, per order. Assisted pt into a more comfortable position with pillows for support on bony prominences. Call light within reach, she has no other needs or concerns and appears to be resting comfortably at this time. Bed alarm on, non-skid socks on and fall risk bundle in place. Will continue to monitor and re-assess for pain.

## 2016-11-01 LAB — TYPE AND SCREEN
ABO/RH(D): A POS
Antibody Screen: NEGATIVE
UNIT DIVISION: 0
UNIT DIVISION: 0
UNIT DIVISION: 0
Unit division: 0
Unit division: 0

## 2016-11-01 LAB — BASIC METABOLIC PANEL
Anion gap: 12 (ref 5–15)
BUN: 105 mg/dL — AB (ref 6–20)
CO2: 27 mmol/L (ref 22–32)
Calcium: 8 mg/dL — ABNORMAL LOW (ref 8.9–10.3)
Chloride: 97 mmol/L — ABNORMAL LOW (ref 101–111)
Creatinine, Ser: 4.84 mg/dL — ABNORMAL HIGH (ref 0.44–1.00)
GFR calc Af Amer: 9 mL/min — ABNORMAL LOW (ref >60)
GFR, EST NON AFRICAN AMERICAN: 8 mL/min — AB (ref >60)
GLUCOSE: 100 mg/dL — AB (ref 65–99)
POTASSIUM: 4 mmol/L (ref 3.5–5.1)
Sodium: 136 mmol/L (ref 135–145)

## 2016-11-01 LAB — CBC
HCT: 23 % — ABNORMAL LOW (ref 36.0–46.0)
Hemoglobin: 7.7 g/dL — ABNORMAL LOW (ref 12.0–15.0)
MCH: 31.3 pg (ref 26.0–34.0)
MCHC: 33.5 g/dL (ref 30.0–36.0)
MCV: 93.5 fL (ref 78.0–100.0)
PLATELETS: 192 10*3/uL (ref 150–400)
RBC: 2.46 MIL/uL — AB (ref 3.87–5.11)
RDW: 19.5 % — ABNORMAL HIGH (ref 11.5–15.5)
WBC: 10.3 10*3/uL (ref 4.0–10.5)

## 2016-11-01 MED ORDER — HEPARIN SODIUM (PORCINE) 1000 UNIT/ML DIALYSIS: 1000.0000 [IU] | INTRAMUSCULAR | Status: DC | PRN

## 2016-11-01 MED ORDER — PENTAFLUOROPROP-TETRAFLUOROETH EX AERO: 1.0000 "application " | INHALATION_SPRAY | CUTANEOUS | Status: DC | PRN

## 2016-11-01 MED ORDER — SODIUM CHLORIDE 0.9 % IV SOLN: 100.0000 mL | INTRAVENOUS | Status: DC | PRN

## 2016-11-01 MED ORDER — DARBEPOETIN ALFA 150 MCG/0.3ML IJ SOSY: 150.0000 ug | PREFILLED_SYRINGE | Freq: Once | INTRAMUSCULAR | Status: DC

## 2016-11-01 MED ORDER — DARBEPOETIN ALFA 150 MCG/0.3ML IJ SOSY
150.0000 ug | PREFILLED_SYRINGE | INTRAMUSCULAR | Status: DC
  Administered 2016-11-04: 150 ug via INTRAVENOUS
  Filled 2016-11-01: qty 0.3

## 2016-11-01 MED ORDER — LIDOCAINE HCL (PF) 1 % IJ SOLN: 5.0000 mL | INTRAMUSCULAR | Status: DC | PRN

## 2016-11-01 MED ORDER — LIDOCAINE-PRILOCAINE 2.5-2.5 % EX CREA: 1.0000 "application " | TOPICAL_CREAM | CUTANEOUS | Status: DC | PRN

## 2016-11-01 MED ORDER — MORPHINE SULFATE (PF) 2 MG/ML IV SOLN
2.0000 mg | INTRAVENOUS | Status: DC | PRN
  Administered 2016-11-01: 2 mg via INTRAVENOUS
  Filled 2016-11-01: qty 1

## 2016-11-01 MED ORDER — ACETAMINOPHEN 325 MG PO TABS
ORAL_TABLET | ORAL | Status: AC
  Filled 2016-11-01: qty 2

## 2016-11-01 NOTE — Progress Notes (Signed)
PROGRESS NOTE    Jill Johnson  ZOX:[id number] DOB: [birth year] DOA: 10/28/2016 PCP: Hollice Espy, MD   Chief Complaint  Patient presents with  . Shortness of Breath    anxiety     Brief Narrative:  HPI on 10/28/2016 by Dr. Madelyn Flavors Jill Johnson is a 75 y.o. female with medical history significant of HTN, HLD, CKD, CAD s/p CABG, AVR, anemia, depression, hypothyroidism; who presents with complaints of shortness of breath. History is obtained by the patient's son as the patient is currently upset and unable to gather herself to provide adequate history. Patient had just recently been seen by her nephrologist Dr. Marisue Humble yesterday, and was told that her fistula was ready to be used and that it was time to start dialysis. However, the patient wanted to wait until after Christmas. Since that time the patient has had complaints of intermittent chest pain and been very anxious stating that she cannot breathe. She describes the chest pain more so as pressure and tightness. Associated symptoms included increasing swelling in her bilateral lower extremities, more depressed, and easily fatigued. Patient has had no loss of consciousness, focal weakness, changes in vision, abdominal pain, nausea, vomiting, or fever. At this time she still makes urine, but the son is unsure if she took her Lasix this morning. Assessment & Plan   Acute respiratory distress -Appears to be improving  -Likely secondary to hypervolemia secondary to ESRD vs anemia, with a component of anxiety -Presented with shortness of breath, elevated BNP, CXR showing probable CHF -Nephrology consulted and appreciated -Suspect will improve with HD and diuresis -Weaned off O2, sats on room air in the 90s -Continue nebs PRN, PRN ativan  Symptomatic anemia/Anemia of chronic disease/Possible GI bleed -hemoglobin upon admission 7.6, baseline 8-9 -hemoglobin dropped to 5.4, given 3uPRBCs -Current hemoglobin 7.7 -FOBT  + -Gastroenterology consulted and appreciated. Conservative management at this time given patient's current condition -patient has not had a colonoscopy or endoscopy  -Continue PPI -Aspirin and heparin discontinued  Chest pain with elevated troponin -Suspect related to demand ischemia vs anemia and overload -Troponins 0.04 - 0.06 - 0.08 -Cardiology consulted and appreciated, echocardiogram pending -Stress test canceled - do as an outpatient  Anxiety/depression -Would benefit from SSRI -Continue Ativan PRN  Coronary artery disease s/p CABG/AVR -Continue aspirin  Essential hypertension -Continue Cozaar and amlodipine  Peripheral neuropathy/toe wounds -Suspect some aspect of peripheral vascular disease due to decreased circulation of lower extremities.  -Continue gabapentin -ABI and/or VVS consult recommended per nephrology   Hypothyroidism -Continue levothyroxine  Deconditioning -PT and OT consulted  DVT Prophylaxis  SCDs  Code Status: Full  Family Communication: Son at bedside  Disposition Plan: Admitted.   Consultants Cardiology Nephrology Vascular surgery Gastroenterology   Procedures  None  Antibiotics   Anti-infectives    None      Subjective:   Jill Johnson seen and examined today. Feels breathing has improved. Had some hand pain this morning, now back pain after dialysis.  Denies currently chest pain or shortness of breath.  Denies abdominal pain, N/V/D/C.  Objective:   Vitals:   11/01/16 0945 11/01/16 1000 11/01/16 1035 11/01/16 1134  BP: (!) 75/41 (!) 120/42 (!) 120/45 (!) 131/43  Pulse: (!) 46 (!) 49 (!) 52 (!) 59  Resp:   15 17  Temp:   97.8 F (36.6 C) 98.2 F (36.8 C)  TempSrc:   Oral Oral  SpO2:   93% 94%  Weight:   70.6 kg (155 lb 10.3  oz)   Height:        Intake/Output Summary (Last 24 hours) at 11/01/16 1221 Last data filed at 11/01/16 1205  Gross per 24 hour  Intake              360 ml  Output             2180 ml   Net            -1820 ml   Filed Weights   11/01/16 0447 11/01/16 0750 11/01/16 1035  Weight: 72.1 kg (158 lb 14.4 oz) 73 kg (160 lb 15 oz) 70.6 kg (155 lb 10.3 oz)    Exam  General: Well developed, well nourished, NAD, appears stated age  HEENT: NCAT, mucous membranes moist.   Cardiovascular: S1 S2 auscultated, 2/6 SEM, RRR  Respiratory: diminished but clear.  Abdomen: Soft, nontender, nondistended, + bowel sounds  Extremities: warm dry without cyanosis clubbing   Neuro: AAOx3, nonfocal  Psych:Appropriate  Data Reviewed: I have personally reviewed following labs and imaging studies  CBC:  Recent Labs Lab 10/28/16 1545  10/30/16 0800 10/30/16 1653 10/31/16 0450 10/31/16 1618 11/01/16 0533  WBC 10.2  < > 11.5* 15.6* 11.9* 11.6* 10.3  NEUTROABS 8.6*  --   --   --   --   --   --   HGB 7.6*  < > 5.4* 8.6* 7.7* 7.7* 7.7*  HCT 23.8*  < > 16.6* 25.4* 22.6* 22.9* 23.0*  MCV 102.1*  < > 97.1 88.8 90.0 90.5 93.5  PLT 293  < > 196 201 186 202 192  < > = values in this interval not displayed. Basic Metabolic Panel:  Recent Labs Lab 10/28/16 1545 10/29/16 0225 10/29/16 0835 10/30/16 0800 10/31/16 0450 11/01/16 0533  NA 134* 140  --  141 138 136  K 5.0 4.9  --  4.6 3.5 4.0  CL 99* 103  --  104 100* 97*  CO2 25 27  --  23 26 27   GLUCOSE 129* 103*  --  108* 94 100*  BUN 114* 131*  --  157* 88* 105*  CREATININE 4.61* 4.86*  --  4.83* 3.77* 4.84*  CALCIUM 8.4* 8.3*  --  8.2* 8.1* 8.0*  MG  --   --  2.3  --   --   --   PHOS  --  4.3  --  5.5*  --   --    GFR: Estimated Creatinine Clearance: 8.5 mL/min (by C-G formula based on SCr of 4.84 mg/dL (H)). Liver Function Tests:  Recent Labs Lab 10/28/16 1545 10/29/16 0225 10/30/16 0800  AST 24  --   --   ALT 15  --   --   ALKPHOS 37*  --   --   BILITOT 1.1  --   --   PROT 6.3*  --   --   ALBUMIN 3.1* 2.6* 2.2*   No results for input(s): LIPASE, AMYLASE in the last 168 hours. No results for input(s): AMMONIA in  the last 168 hours. Coagulation Profile:  Recent Labs Lab 10/28/16 1545  INR 1.35   Cardiac Enzymes:  Recent Labs Lab 10/28/16 1545 10/28/16 2116 10/29/16 0225 10/29/16 0835  TROPONINI 0.04* 0.06* 0.08* 0.08*   BNP (last 3 results) No results for input(s): PROBNP in the last 8760 hours. HbA1C: No results for input(s): HGBA1C in the last 72 hours. CBG: No results for input(s): GLUCAP in the last 168 hours. Lipid Profile: No results for input(s): CHOL,  HDL, LDLCALC, TRIG, CHOLHDL, LDLDIRECT in the last 72 hours. Thyroid Function Tests: No results for input(s): TSH, T4TOTAL, FREET4, T3FREE, THYROIDAB in the last 72 hours. Anemia Panel: No results for input(s): VITAMINB12, FOLATE, FERRITIN, TIBC, IRON, RETICCTPCT in the last 72 hours. Urine analysis:    Component Value Date/Time   COLORURINE YELLOW 10/28/2016 1600   APPEARANCEUR CLEAR 10/28/2016 1600   LABSPEC 1.011 10/28/2016 1600   PHURINE 6.5 10/28/2016 1600   GLUCOSEU NEGATIVE 10/28/2016 1600   HGBUR NEGATIVE 10/28/2016 1600   BILIRUBINUR NEGATIVE 10/28/2016 1600   KETONESUR NEGATIVE 10/28/2016 1600   PROTEINUR 30 (A) 10/28/2016 1600   UROBILINOGEN 1.0 01/30/2010 1104   NITRITE NEGATIVE 10/28/2016 1600   LEUKOCYTESUR NEGATIVE 10/28/2016 1600   Sepsis Labs: @LABRCNTIP (procalcitonin:4,lacticidven:4)  ) Recent Results (from the past 240 hour(s))  Urine culture     Status: None   Collection Time: 10/28/16  4:00 PM  Result Value Ref Range Status   Specimen Description URINE, CATHETERIZED  Final   Special Requests NONE  Final   Culture NO GROWTH [facility]   Final   Report Status 10/29/2016 FINAL  Final  MRSA PCR Screening     Status: None   Collection Time: 10/28/16  8:57 PM  Result Value Ref Range Status   MRSA by PCR NEGATIVE NEGATIVE Final    Comment:        The GeneXpert MRSA Assay (FDA approved for NASAL specimens only), is one component of a comprehensive MRSA  colonization surveillance program. It is not intended to diagnose MRSA infection nor to guide or monitor treatment for MRSA infections.       Radiology Studies: No results found.   Scheduled Meds: . sodium chloride   Intravenous Once  . sodium chloride   Intravenous Once  . amLODipine  10 mg Oral Daily  . cholecalciferol  2,000 Units Oral Daily  . [START ON 11/04/2016] darbepoetin (ARANESP) injection - DIALYSIS  150 mcg Intravenous Q Wed-HD  . gabapentin  300 mg Oral Daily  . levothyroxine  112 mcg Oral QAC breakfast  . losartan  50 mg Oral QHS  . pantoprazole (PROTONIX) IV  40 mg Intravenous Q12H  . sucralfate  1 g Oral TID WC & HS  . vitamin B-12  100 mcg Oral Daily  . zinc oxide   Topical BID   Continuous Infusions:   LOS: 4 days   Time Spent in minutes   30 minutes  Marl Seago D.O. on 11/01/2016 at 12:21 PM  Between 7am to 7pm - Pager - [phone]  After 7pm go to [email or web address] - password [email or web address]  And look for the night coverage person covering for me after hours  [facility] Office  [phone]

## 2016-11-01 NOTE — Plan of Care (Signed)
Problem: Skin Integrity: Goal: Risk for impaired skin integrity will decrease Outcome: Progressing Discussed with patient importance of keeping groin and bottom clean and dry.

## 2016-11-01 NOTE — Procedures (Signed)
I have seen and examined this patient and agree with the plan of care  No complaints   AVF  Appears to be working well  Pulse 51    BP 140/80  No edema   Hb 7.7    Darbepoetin 150 mcg q week   Ca 8.0   K 4.0   CO2   27    Phos 5.5    Alb 2.2      Sadye Kiernan W 11/01/2016, 8:33 AM

## 2016-11-02 LAB — BASIC METABOLIC PANEL
Anion gap: 8 (ref 5–15)
BUN: 59 mg/dL — AB (ref 6–20)
CALCIUM: 7.8 mg/dL — AB (ref 8.9–10.3)
CHLORIDE: 96 mmol/L — AB (ref 101–111)
CO2: 30 mmol/L (ref 22–32)
CREATININE: 4.3 mg/dL — AB (ref 0.44–1.00)
GFR calc non Af Amer: 9 mL/min — ABNORMAL LOW (ref >60)
GFR, EST AFRICAN AMERICAN: 11 mL/min — AB (ref >60)
GLUCOSE: 112 mg/dL — AB (ref 65–99)
Potassium: 3.5 mmol/L (ref 3.5–5.1)
Sodium: 134 mmol/L — ABNORMAL LOW (ref 135–145)

## 2016-11-02 LAB — CBC
HEMATOCRIT: 23 % — AB (ref 36.0–46.0)
HEMOGLOBIN: 7.6 g/dL — AB (ref 12.0–15.0)
MCH: 30.8 pg (ref 26.0–34.0)
MCHC: 33 g/dL (ref 30.0–36.0)
MCV: 93.1 fL (ref 78.0–100.0)
Platelets: 214 10*3/uL (ref 150–400)
RBC: 2.47 MIL/uL — ABNORMAL LOW (ref 3.87–5.11)
RDW: 19.4 % — ABNORMAL HIGH (ref 11.5–15.5)
WBC: 9.4 10*3/uL (ref 4.0–10.5)

## 2016-11-02 MED ORDER — PANTOPRAZOLE SODIUM 40 MG PO TBEC
40.0000 mg | DELAYED_RELEASE_TABLET | Freq: Two times a day (BID) | ORAL | Status: DC
  Administered 2016-11-02 – 2016-11-07 (×10): 40 mg via ORAL
  Filled 2016-11-02 (×10): qty 1

## 2016-11-02 NOTE — Progress Notes (Signed)
Report received in patient's room via Rey RN using [employer] format, updated on VS, labs and patient's general condition, assumed care of patient

## 2016-11-02 NOTE — Progress Notes (Signed)
[facility] ROUNDING NOTE   Subjective:   Interval History: no complaints this morning   Dialysis with about 2 L removed yesterday   Objective:  Vital signs in last 24 hours:  Temp:  [97.8 F (36.6 C)-98.7 F (37.1 C)] 98.7 F (37.1 C) (12/25 0700) Pulse Rate:  [50-59] 50 (12/25 0700) Resp:  [10-17] 15 (12/25 0700) BP: (108-145)/(30-66) 123/36 (12/25 0700) SpO2:  [93 %-100 %] 100 % (12/25 0700) Weight:  [69.3 kg (152 lb 12.5 oz)] 69.3 kg (152 lb 12.5 oz) (12/25 0500)  Weight change: 0.923 kg (2 lb 0.6 oz) Filed Weights   11/01/16 0750 11/01/16 1035 11/02/16 0500  Weight: 73 kg (160 lb 15 oz) 70.6 kg (155 lb 10.3 oz) 69.3 kg (152 lb 12.5 oz)    Intake/Output: I/O last 3 completed shifts: In: 210 [P.O.:210] Out: 2255 [Urine:725; Other:1530]   Intake/Output this shift:  Total I/O In: 362 [P.O.:140; Other:222] Out: -   CVS- RRR RS- CTA ABD- BS present soft non-distended EXT- no edema   Basic Metabolic Panel:  Recent Labs Lab 10/29/16 0225 10/29/16 0835 10/30/16 0800 10/31/16 0450 11/01/16 0533 11/02/16 0813  NA 140  --  141 138 136 134*  K 4.9  --  4.6 3.5 4.0 3.5  CL 103  --  104 100* 97* 96*  CO2 27  --  23 26 27 30   GLUCOSE 103*  --  108* 94 100* 112*  BUN 131*  --  157* 88* 105* 59*  CREATININE 4.86*  --  4.83* 3.77* 4.84* 4.30*  CALCIUM 8.3*  --  8.2* 8.1* 8.0* 7.8*  MG  --  2.3  --   --   --   --   PHOS 4.3  --  5.5*  --   --   --     Liver Function Tests:  Recent Labs Lab 10/28/16 1545 10/29/16 0225 10/30/16 0800  AST 24  --   --   ALT 15  --   --   ALKPHOS 37*  --   --   BILITOT 1.1  --   --   PROT 6.3*  --   --   ALBUMIN 3.1* 2.6* 2.2*   No results for input(s): LIPASE, AMYLASE in the last 168 hours. No results for input(s): AMMONIA in the last 168 hours.  CBC:  Recent Labs Lab 10/28/16 1545  10/30/16 1653 10/31/16 0450 10/31/16 1618 11/01/16 0533 11/02/16 0813  WBC 10.2  < > 15.6* 11.9* 11.6* 10.3 9.4   NEUTROABS 8.6*  --   --   --   --   --   --   HGB 7.6*  < > 8.6* 7.7* 7.7* 7.7* 7.6*  HCT 23.8*  < > 25.4* 22.6* 22.9* 23.0* 23.0*  MCV 102.1*  < > 88.8 90.0 90.5 93.5 93.1  PLT 293  < > 201 186 202 192 214  < > = values in this interval not displayed.  Cardiac Enzymes:  Recent Labs Lab 10/28/16 1545 10/28/16 2116 10/29/16 0225 10/29/16 0835  TROPONINI 0.04* 0.06* 0.08* 0.08*    BNP: Invalid input(s): POCBNP  CBG: No results for input(s): GLUCAP in the last 168 hours.  Microbiology: Results for orders placed or performed during the hospital encounter of 10/28/16  Urine culture     Status: None   Collection Time: 10/28/16  4:00 PM  Result Value Ref Range Status   Specimen Description URINE, CATHETERIZED  Final   Special Requests NONE  Final  Culture NO GROWTH [facility]   Final   Report Status 10/29/2016 FINAL  Final  MRSA PCR Screening     Status: None   Collection Time: 10/28/16  8:57 PM  Result Value Ref Range Status   MRSA by PCR NEGATIVE NEGATIVE Final    Comment:        The GeneXpert MRSA Assay (FDA approved for NASAL specimens only), is one component of a comprehensive MRSA colonization surveillance program. It is not intended to diagnose MRSA infection nor to guide or monitor treatment for MRSA infections.     Coagulation Studies: No results for input(s): LABPROT, INR in the last 72 hours.  Urinalysis: No results for input(s): COLORURINE, LABSPEC, PHURINE, GLUCOSEU, HGBUR, BILIRUBINUR, KETONESUR, PROTEINUR, UROBILINOGEN, NITRITE, LEUKOCYTESUR in the last 72 hours.  Invalid input(s): APPERANCEUR    Imaging: No results found.   Medications:    . sodium chloride   Intravenous Once  . sodium chloride   Intravenous Once  . amLODipine  10 mg Oral Daily  . cholecalciferol  2,000 Units Oral Daily  . [START ON 11/04/2016] darbepoetin (ARANESP) injection - DIALYSIS  150 mcg Intravenous Q Wed-HD  . gabapentin  300 mg Oral  Daily  . levothyroxine  112 mcg Oral QAC breakfast  . losartan  50 mg Oral QHS  . pantoprazole (PROTONIX) IV  40 mg Intravenous Q12H  . sucralfate  1 g Oral TID WC & HS  . vitamin B-12  100 mcg Oral Daily  . zinc oxide   Topical BID   acetaminophen **OR** acetaminophen, albuterol, guaiFENesin-dextromethorphan, LORazepam, morphine injection, ondansetron  Assessment/ Plan:  Jill Huhancy L Peedenis a 75 y.o.femalewith medical history significant of HTN, HLD, CKD, CAD s/p CABG, AVR, anemia, depression, hypothyroidism;who presents with complaints of shortness of breath.  1. End stage Renal disease   Successful first dialysis 12/22   Appears to be tolerating well. Will plan next dialysis 12/27 2. Volume  - fluid removal with dialysis  Still receives Lasartan and Amlodipine  3. Anemia  Hb 7.6   Will give another dose of Aranesp Wednesday with dialysis 4. Bones  PTH 120   Continues on cholecalciferol daily  5. Hypothyroidism  She was taking levothyroxine   LOS: 5 Jules Vidovich W @TODAY @11 :09 AM

## 2016-11-02 NOTE — Progress Notes (Signed)
PROGRESS NOTE    Jill Johnson  ZOX:[id number] DOB: 1941-10-23 DOA: 10/28/2016 PCP: Hollice Espy, MD   Chief Complaint  Patient presents with  . Shortness of Breath    anxiety     Brief Narrative:  HPI on 10/28/2016 by Dr. Madelyn Flavors Jill Johnson is a 75 y.o. female with medical history significant of HTN, HLD, CKD, CAD s/p CABG, AVR, anemia, depression, hypothyroidism; who presents with complaints of shortness of breath. History is obtained by the patient's son as the patient is currently upset and unable to gather herself to provide adequate history. Patient had just recently been seen by her nephrologist Dr. Marisue Humble yesterday, and was told that her fistula was ready to be used and that it was time to start dialysis. However, the patient wanted to wait until after Christmas. Since that time the patient has had complaints of intermittent chest pain and been very anxious stating that she cannot breathe. She describes the chest pain more so as pressure and tightness. Associated symptoms included increasing swelling in her bilateral lower extremities, more depressed, and easily fatigued. Patient has had no loss of consciousness, focal weakness, changes in vision, abdominal pain, nausea, vomiting, or fever. At this time she still makes urine, but the son is unsure if she took her Lasix this morning. Assessment & Plan   Acute respiratory distress -Appears to be improving -Likely secondary to hypervolemia secondary to ESRD vs anemia, with a component of anxiety -Presented with shortness of breath, elevated BNP, CXR showing probable CHF -Nephrology consulted and appreciated -Suspect will improve with HD and diuresis -Weaned off O2, sats on room air in the 90s -Continue nebs PRN, PRN ativan  Symptomatic anemia/Anemia of chronic disease/Possible GI bleed -hemoglobin upon admission 7.6, baseline 8-9 -hemoglobin dropped to 5.4, given 3uPRBCs -Current hemoglobin 7.6 -FOBT  + -Gastroenterology consulted and appreciated. Conservative management at this time given patient's current condition -patient has not had a colonoscopy or endoscopy  -Continue PPI -Aspirin and heparin discontinued  Chest pain with elevated troponin -Suspect related to demand ischemia vs anemia and overload -Troponins 0.04 - 0.06 - 0.08 -Cardiology consulted and appreciated, echocardiogram pending -Stress test canceled - do as an outpatient  Anxiety/depression -Would benefit from SSRI -Continue Ativan PRN  Coronary artery disease s/p CABG/AVR -Continue aspirin  Essential hypertension -Continue Cozaar and amlodipine  Peripheral neuropathy/toe wounds -Suspect some aspect of peripheral vascular disease due to decreased circulation of lower extremities.  -Continue gabapentin -ABI and/or VVS consult recommended per nephrology   Hypothyroidism -Continue levothyroxine  Deconditioning -PT and OT consulted  DVT Prophylaxis  SCDs  Code Status: Full  Family Communication: None at bedside  Disposition Plan: Admitted. Pending PT/OT  Consultants Cardiology Nephrology Vascular surgery Gastroenterology   Procedures  None  Antibiotics   Anti-infectives    None      Subjective:   Jill Johnson seen and examined today. Feels breathing has improved, but feels bad overall.  Cannot elaborate why she feels bad. Denies currently chest pain or shortness of breath.  Denies abdominal pain, N/V/D/C.  Objective:   Vitals:   11/01/16 2110 11/02/16 0007 11/02/16 0500 11/02/16 0700  BP: (!) 136/41 (!) 145/41 108/66 (!) 123/36  Pulse: (!) 55 (!) 57  (!) 50  Resp: 14 10  15   Temp: 97.8 F (36.6 C) 98 F (36.7 C) 97.8 F (36.6 C) 98.7 F (37.1 C)  TempSrc: Oral Oral Oral Oral  SpO2: 93% 99%  100%  Weight:   69.3 kg (  152 lb 12.5 oz)   Height:        Intake/Output Summary (Last 24 hours) at 11/02/16 1041 Last data filed at 11/02/16 0900  Gross per 24 hour  Intake               572 ml  Output               75 ml  Net              497 ml   Filed Weights   11/01/16 0750 11/01/16 1035 11/02/16 0500  Weight: 73 kg (160 lb 15 oz) 70.6 kg (155 lb 10.3 oz) 69.3 kg (152 lb 12.5 oz)    Exam  General: Well developed, well nourished, NAD, appears stated age  HEENT: NCAT, mucous membranes moist.   Cardiovascular: S1 S2 auscultated, 2/6 SEM, RRR  Respiratory: diminished but clear.  Abdomen: Soft, nontender, nondistended, + bowel sounds  Extremities: warm dry without cyanosis clubbing   Neuro: AAOx3, nonfocal  Psych:Depressed  Data Reviewed: I have personally reviewed following labs and imaging studies  CBC:  Recent Labs Lab 10/28/16 1545  10/30/16 1653 10/31/16 0450 10/31/16 1618 11/01/16 0533 11/02/16 0813  WBC 10.2  < > 15.6* 11.9* 11.6* 10.3 9.4  NEUTROABS 8.6*  --   --   --   --   --   --   HGB 7.6*  < > 8.6* 7.7* 7.7* 7.7* 7.6*  HCT 23.8*  < > 25.4* 22.6* 22.9* 23.0* 23.0*  MCV 102.1*  < > 88.8 90.0 90.5 93.5 93.1  PLT 293  < > 201 186 202 192 214  < > = values in this interval not displayed. Basic Metabolic Panel:  Recent Labs Lab 10/29/16 0225 10/29/16 0835 10/30/16 0800 10/31/16 0450 11/01/16 0533 11/02/16 0813  NA 140  --  141 138 136 134*  K 4.9  --  4.6 3.5 4.0 3.5  CL 103  --  104 100* 97* 96*  CO2 27  --  23 26 27 30   GLUCOSE 103*  --  108* 94 100* 112*  BUN 131*  --  157* 88* 105* 59*  CREATININE 4.86*  --  4.83* 3.77* 4.84* 4.30*  CALCIUM 8.3*  --  8.2* 8.1* 8.0* 7.8*  MG  --  2.3  --   --   --   --   PHOS 4.3  --  5.5*  --   --   --    GFR: Estimated Creatinine Clearance: 9.5 mL/min (by C-G formula based on SCr of 4.3 mg/dL (H)). Liver Function Tests:  Recent Labs Lab 10/28/16 1545 10/29/16 0225 10/30/16 0800  AST 24  --   --   ALT 15  --   --   ALKPHOS 37*  --   --   BILITOT 1.1  --   --   PROT 6.3*  --   --   ALBUMIN 3.1* 2.6* 2.2*   No results for input(s): LIPASE, AMYLASE in the last 168  hours. No results for input(s): AMMONIA in the last 168 hours. Coagulation Profile:  Recent Labs Lab 10/28/16 1545  INR 1.35   Cardiac Enzymes:  Recent Labs Lab 10/28/16 1545 10/28/16 2116 10/29/16 0225 10/29/16 0835  TROPONINI 0.04* 0.06* 0.08* 0.08*   BNP (last 3 results) No results for input(s): PROBNP in the last 8760 hours. HbA1C: No results for input(s): HGBA1C in the last 72 hours. CBG: No results for input(s): GLUCAP in the last 168 hours.  Lipid Profile: No results for input(s): CHOL, HDL, LDLCALC, TRIG, CHOLHDL, LDLDIRECT in the last 72 hours. Thyroid Function Tests: No results for input(s): TSH, T4TOTAL, FREET4, T3FREE, THYROIDAB in the last 72 hours. Anemia Panel: No results for input(s): VITAMINB12, FOLATE, FERRITIN, TIBC, IRON, RETICCTPCT in the last 72 hours. Urine analysis:    Component Value Date/Time   COLORURINE YELLOW 10/28/2016 1600   APPEARANCEUR CLEAR 10/28/2016 1600   LABSPEC 1.011 10/28/2016 1600   PHURINE 6.5 10/28/2016 1600   GLUCOSEU NEGATIVE 10/28/2016 1600   HGBUR NEGATIVE 10/28/2016 1600   BILIRUBINUR NEGATIVE 10/28/2016 1600   KETONESUR NEGATIVE 10/28/2016 1600   PROTEINUR 30 (A) 10/28/2016 1600   UROBILINOGEN 1.0 01/30/2010 1104   NITRITE NEGATIVE 10/28/2016 1600   LEUKOCYTESUR NEGATIVE 10/28/2016 1600   Sepsis Labs: @LABRCNTIP (procalcitonin:4,lacticidven:4)  ) Recent Results (from the past 240 hour(s))  Urine culture     Status: None   Collection Time: 10/28/16  4:00 PM  Result Value Ref Range Status   Specimen Description URINE, CATHETERIZED  Final   Special Requests NONE  Final   Culture NO GROWTH [facility]   Final   Report Status 10/29/2016 FINAL  Final  MRSA PCR Screening     Status: None   Collection Time: 10/28/16  8:57 PM  Result Value Ref Range Status   MRSA by PCR NEGATIVE NEGATIVE Final    Comment:        The GeneXpert MRSA Assay (FDA approved for NASAL specimens only), is one  component of a comprehensive MRSA colonization surveillance program. It is not intended to diagnose MRSA infection nor to guide or monitor treatment for MRSA infections.       Radiology Studies: No results found.   Scheduled Meds: . sodium chloride   Intravenous Once  . sodium chloride   Intravenous Once  . amLODipine  10 mg Oral Daily  . cholecalciferol  2,000 Units Oral Daily  . [START ON 11/04/2016] darbepoetin (ARANESP) injection - DIALYSIS  150 mcg Intravenous Q Wed-HD  . gabapentin  300 mg Oral Daily  . levothyroxine  112 mcg Oral QAC breakfast  . losartan  50 mg Oral QHS  . pantoprazole (PROTONIX) IV  40 mg Intravenous Q12H  . sucralfate  1 g Oral TID WC & HS  . vitamin B-12  100 mcg Oral Daily  . zinc oxide   Topical BID   Continuous Infusions:   LOS: 5 days   Time Spent in minutes   30 minutes  Demoni Parmar D.O. on 11/02/2016 at 10:41 AM  Between 7am to 7pm - Pager - [phone]  After 7pm go to [email or web address] - password [email or web address]  And look for the night coverage person covering for me after hours  [facility] Office  [phone]

## 2016-11-03 LAB — CBC
HCT: 22.3 % — ABNORMAL LOW (ref 36.0–46.0)
HCT: 22.2 % — ABNORMAL LOW (ref 36.0–46.0)
HEMOGLOBIN: 7.4 g/dL — AB (ref 12.0–15.0)
Hemoglobin: 7.5 g/dL — ABNORMAL LOW (ref 12.0–15.0)
MCH: 31.2 pg (ref 26.0–34.0)
MCH: 32.6 pg (ref 26.0–34.0)
MCHC: 33.2 g/dL (ref 30.0–36.0)
MCHC: 33.8 g/dL (ref 30.0–36.0)
MCV: 94.1 fL (ref 78.0–100.0)
MCV: 96.5 fL (ref 78.0–100.0)
PLATELETS: 193 10*3/uL (ref 150–400)
Platelets: 206 10*3/uL (ref 150–400)
RBC: 2.37 MIL/uL — ABNORMAL LOW (ref 3.87–5.11)
RBC: 2.3 MIL/uL — AB (ref 3.87–5.11)
RDW: 19.3 % — AB (ref 11.5–15.5)
RDW: 19.5 % — ABNORMAL HIGH (ref 11.5–15.5)
WBC: 8.9 10*3/uL (ref 4.0–10.5)
WBC: 8.6 10*3/uL (ref 4.0–10.5)

## 2016-11-03 LAB — RENAL FUNCTION PANEL
ALBUMIN: 2.3 g/dL — AB (ref 3.5–5.0)
Anion gap: 12 (ref 5–15)
BUN: 72 mg/dL — AB (ref 6–20)
CALCIUM: 7.4 mg/dL — AB (ref 8.9–10.3)
CO2: 24 mmol/L (ref 22–32)
CREATININE: 5.33 mg/dL — AB (ref 0.44–1.00)
Chloride: 95 mmol/L — ABNORMAL LOW (ref 101–111)
GFR calc Af Amer: 8 mL/min — ABNORMAL LOW (ref >60)
GFR calc non Af Amer: 7 mL/min — ABNORMAL LOW (ref >60)
GLUCOSE: 122 mg/dL — AB (ref 65–99)
PHOSPHORUS: 7.1 mg/dL — AB (ref 2.5–4.6)
Potassium: 3.9 mmol/L (ref 3.5–5.1)
SODIUM: 131 mmol/L — AB (ref 135–145)

## 2016-11-03 LAB — BASIC METABOLIC PANEL
Anion gap: 11 (ref 5–15)
BUN: 65 mg/dL — ABNORMAL HIGH (ref 6–20)
CALCIUM: 7.7 mg/dL — AB (ref 8.9–10.3)
CO2: 26 mmol/L (ref 22–32)
CREATININE: 5.24 mg/dL — AB (ref 0.44–1.00)
Chloride: 95 mmol/L — ABNORMAL LOW (ref 101–111)
GFR calc non Af Amer: 7 mL/min — ABNORMAL LOW (ref >60)
GFR, EST AFRICAN AMERICAN: 8 mL/min — AB (ref >60)
Glucose, Bld: 110 mg/dL — ABNORMAL HIGH (ref 65–99)
Potassium: 3.7 mmol/L (ref 3.5–5.1)
SODIUM: 132 mmol/L — AB (ref 135–145)

## 2016-11-03 MED ORDER — HEPARIN SODIUM (PORCINE) 1000 UNIT/ML DIALYSIS: 1000.0000 [IU] | INTRAMUSCULAR | Status: DC | PRN

## 2016-11-03 MED ORDER — LIDOCAINE-PRILOCAINE 2.5-2.5 % EX CREA: 1.0000 "application " | TOPICAL_CREAM | CUTANEOUS | Status: DC | PRN

## 2016-11-03 MED ORDER — PENTAFLUOROPROP-TETRAFLUOROETH EX AERO: 1.0000 "application " | INHALATION_SPRAY | CUTANEOUS | Status: DC | PRN

## 2016-11-03 MED ORDER — ALTEPLASE 2 MG IJ SOLR: 2.0000 mg | Freq: Once | INTRAMUSCULAR | Status: DC | PRN

## 2016-11-03 MED ORDER — SODIUM CHLORIDE 0.9 % IV SOLN
125.0000 mg | Freq: Once | INTRAVENOUS | Status: AC
  Administered 2016-11-03: 125 mg via INTRAVENOUS
  Filled 2016-11-03: qty 10

## 2016-11-03 MED ORDER — SODIUM CHLORIDE 0.9 % IV SOLN: 100.0000 mL | INTRAVENOUS | Status: DC | PRN

## 2016-11-03 MED ORDER — LIDOCAINE HCL (PF) 1 % IJ SOLN: 5.0000 mL | INTRAMUSCULAR | Status: DC | PRN

## 2016-11-03 NOTE — Evaluation (Signed)
Occupational Therapy Evaluation Patient Details Name: Jill Johnson MRN: [id number] DOB: 07/01/1941 Today's Date: 11/03/2016    History of Present Illness  Jill Perfectancy L Boike is a 75 y.o. female with medical history significant of HTN, HLD, CKD, CAD s/p CABG, AVR, anemia, depression, hypothyroidism; who presents with complaints of shortness of breath due to volume overload.   Clinical Impression   PT admitted with ESRD and intermittent neuropathic pain L hand . Pt currently with functional limitiations due to the deficits listed below (see OT problem list). PTA was living at home alone with reliance on others for transportation.  Pt will benefit from skilled OT to increase their independence and safety with adls and balance to allow discharge SNF.     Follow Up Recommendations  SNF    Equipment Recommendations  3 in 1 bedside commode;Hospital bed;Other (comment) (RW)    Recommendations for Other Services       Precautions / Restrictions Precautions Precautions: Fall Restrictions Weight Bearing Restrictions: No      Mobility Bed Mobility Overal bed mobility: Needs Assistance Bed Mobility: Rolling;Supine to Sit Rolling: Min assist   Supine to sit: Mod assist;HOB elevated     General bed mobility comments: Pt requires (A) to progress to eob and to elevate trunk of surface  Transfers Overall transfer level: Needs assistance   Transfers: Sit to/from Stand Sit to Stand: Mod assist         General transfer comment: cues for hand placement, lifting assist elevated surface and stedy used instead of RW because unable to maintain static standing    Balance Overall balance assessment: Needs assistance Sitting-balance support: No upper extremity supported;Bilateral upper extremity supported Sitting balance-Leahy Scale: Fair     Standing balance support: Bilateral upper extremity supported Standing balance-Leahy Scale: Poor Standing balance comment: reliant onthe bar on  the stedy                            ADL Overall ADL's : Needs assistance/impaired Eating/Feeding: Set up   Grooming: Wash/dry hands;Set up   Upper Body Bathing: Moderate assistance   Lower Body Bathing: Maximal assistance   Upper Body Dressing : Minimal assistance   Lower Body Dressing: Maximal assistance Lower Body Dressing Details (indicate cue type and reason): bil lE thread into adult brief supine in bed and static standing to pull up total (A)              Functional mobility during ADLs: Moderate assistance General ADL Comments: pt incontinent with awarness and liable at times without clear on set     Vision     Perception     Praxis      Pertinent Vitals/Pain Pain Assessment: Faces Faces Pain Scale: Hurts little more Pain Location: joints, L hand Pain Descriptors / Indicators: Aching;Burning;Sore Pain Intervention(s): Limited activity within patient's tolerance;Repositioned;Premedicated before session     Hand Dominance     Extremity/Trunk Assessment Upper Extremity Assessment Upper Extremity Assessment: LUE deficits/detail LUE Deficits / Details: reports severe pain in L UE. pt however able to functionally place on stedy and RW to help with transfer and maintain grasp   Lower Extremity Assessment Lower Extremity Assessment: Generalized weakness;Defer to PT evaluation   Cervical / Trunk Assessment Cervical / Trunk Assessment: Kyphotic   Communication Communication Communication: No difficulties   Cognition Arousal/Alertness: Awake/alert Behavior During Therapy: WFL for tasks assessed/performed Overall Cognitive Status: Impaired/Different from baseline Area of Impairment: Awareness;Safety/judgement  Safety/Judgement: Decreased awareness of safety;Decreased awareness of deficits Awareness: Emergent   General Comments: pt incontinent of bladder and reports it to OT. when asked when she has not informed RN or the tech that had  just checked on her she reports "well because my breakfast was here and i dont know really."  Pt liable once during session stating "why am i like this"   General Comments       Exercises       Shoulder Instructions      Home Living Family/patient expects to be discharged to:: Private residence Living Arrangements: Alone Available Help at Discharge: Family;Available PRN/intermittently Type of Home: Mobile home Home Access: Stairs to enter;Ramped entrance Entrance Stairs-Number of Steps: 5 Entrance Stairs-Rails: Right;Left Home Layout: One level     Bathroom Shower/Tub: Chief Strategy OfficerTub/shower unit   Bathroom Toilet: Standard     Home Equipment: Environmental consultantWalker - 4 wheels;Bedside commode;Shower seat;Wheelchair - manual          Prior Functioning/Environment Level of Independence: Independent with assistive device(s)                 OT Problem List: Decreased strength;Decreased activity tolerance;Impaired balance (sitting and/or standing);Decreased safety awareness;Decreased knowledge of use of DME or AE;Decreased knowledge of precautions;Cardiopulmonary status limiting activity;Pain;Obesity;Impaired UE functional use;Decreased cognition   OT Treatment/Interventions: Therapeutic exercise;Self-care/ADL training;DME and/or AE instruction;Therapeutic activities;Patient/family education;Balance training;Cognitive remediation/compensation;Energy conservation    OT Goals(Current goals can be found in the care plan section) Acute Rehab OT Goals Patient Stated Goal: hope to get home OT Goal Formulation: With patient Time For Goal Achievement: 11/17/16 Potential to Achieve Goals: Good  OT Frequency: Min 2X/week   Barriers to D/C: Decreased caregiver support  lives alone and relies on old coworkers to visit and provide. Pt has to rely on others for food and transportation       Co-evaluation              End of Session Equipment Utilized During Treatment: Gait belt Nurse  Communication: Mobility status;Precautions  Activity Tolerance: Patient tolerated treatment well Patient left: in chair;with call bell/phone within reach;with chair alarm set   Time: [phone] OT Time Calculation (min): 23 min Charges:  OT General Charges $OT Visit: 1 Procedure OT Evaluation $OT Eval Moderate Complexity: 1 Procedure OT Treatments $Self Care/Home Management : 8-22 mins G-Codes:    Johnson MasterJones, Jill Holtmeyer B 11/03/2016, 11:19 AM  Mateo Johnson, Jill   OTR/L Pager: [phone]: [phone] Office: [phone] .

## 2016-11-03 NOTE — Plan of Care (Signed)
Problem: Phase I Progression Outcomes Goal: Progress activity as tolerated unless otherwise ordered Outcome: Progressing Patient up in the chair and worked with PT and OT this am. See notes

## 2016-11-03 NOTE — Clinical Social Work Note (Signed)
Clinical Social Work Assessment  Patient Details  Name: Jill Johnson MRN: [id number] Date of Birth: [birth year]  Date of referral:  11/03/16               Reason for consult:  Facility Placement                Permission sought to share information with:    Permission granted to share information::  Yes, Verbal Permission Granted  Name::     Monna Fam  Relationship::  son  Contact Information:  [phone]  Housing/Transportation Living arrangements for the past 2 months:  Single Family Home Source of Information:  Adult Children Patient Interpreter Needed:  None Criminal Activity/Legal Involvement Pertinent to Current Situation/Hospitalization:  No - Comment as needed Significant Relationships:  Adult Children Lives with:  Self Do you feel safe going back to the place where you live?  Yes Need for family participation in patient care:  Yes (Comment)  Care giving concerns:  No care giving concerns identified.   Social Worker assessment / plan:  CSW met with pt and son to address consult for New SNF. CSW introduced herself and explained role of social work. CSW also explained the process of discharging to SNF. Pt is also a new HD pt and a schedule needs to be confirmed. PT is recommending SNF placement. CSW initiated SNF search and will follow up with bed offers. CSW updated FL2 for referral, and it will be updated at discharge. CSW will continue to follow.   Employment status:  Retired Nurse, adult PT Recommendations:  [place] / Referral to community resources:  [place]  Patient/Family's Response to care:  Pt and son were appreciative of CSW's support.   Patient/Family's Understanding of and Emotional Response to Diagnosis, Current Treatment, and Prognosis:  Pt and son understand placement needs.   Emotional Assessment Appearance:  Appears stated age Attitude/Demeanor/Rapport:   (appropriate) Affect  (typically observed):  Accepting, Adaptable Orientation:  Oriented to Self, Oriented to Place, Oriented to  Time, Oriented to Situation Alcohol / Substance use:  Not Applicable Psych involvement (Current and /or in the community):  No (Comment)  Discharge Needs  Concerns to be addressed:  Adjustment to Illness Readmission within the last 30 days:  No Current discharge risk:  Chronically ill Barriers to Discharge:  Continued Medical Work up, Waiting for outpatient dialysis   Darden Dates, LCSW 11/03/2016, 4:01 PM

## 2016-11-03 NOTE — Clinical Social Work Placement (Signed)
   CLINICAL SOCIAL WORK PLACEMENT  NOTE  Date:  11/03/2016  Patient Details  Name: Jill Johnson MRN: [id number] Date of Birth: July 29, 1941  Clinical Social Work is seeking post-discharge placement for this patient at the Skilled  Nursing Facility level of care (*CSW will initial, date and re-position this form in  chart as items are completed):  Yes   Patient/family provided with [facility] Clinical Social Work Department's list of facilities offering this level of care within the geographic area requested by the patient (or if unable, by the patient's family).  Yes   Patient/family informed of their freedom to choose among providers that offer the needed level of care, that participate in Medicare, Medicaid or managed care program needed by the patient, have an available bed and are willing to accept the patient.  Yes   Patient/family informed of 's ownership interest in [facility] and [facility], as well as of the fact that they are under no obligation to receive care at these facilities.  PASRR submitted to EDS on 10/30/16     PASRR number received on 10/30/16     Existing PASRR number confirmed on       FL2 transmitted to all facilities in geographic area requested by pt/family on 11/03/16     FL2 transmitted to all facilities within larger geographic area on       Patient informed that his/her managed care company has contracts with or will negotiate with certain facilities, including the following:            Patient/family informed of bed offers received.  Patient chooses bed at       Physician recommends and patient chooses bed at      Patient to be transferred to   on  .  Patient to be transferred to facility by       Patient family notified on   of transfer.  Name of family member notified:        PHYSICIAN       Additional Comment:    _______________________________________________ Dede QuerySarah Patria Warzecha, LCSW 11/03/2016, 4:05 PM

## 2016-11-03 NOTE — Care Management Important Message (Signed)
Important Message  Patient Details  Name: Dorthula Perfectancy L Karch MRN: [id number] Date of Birth: 12-Apr-1941   Medicare Important Message Given:  Yes    Sylvan Sookdeo 11/03/2016, 1:33 PM

## 2016-11-03 NOTE — Progress Notes (Signed)
PROGRESS NOTE    Jill Johnson  Johnson:[id number] DOB: [birth year] DOA: 10/28/2016 PCP: Hollice EspyGATES,DONNA RUTH, MD   Chief Complaint  Patient presents with  . Shortness of Breath    anxiety     Brief Narrative:  HPI on 10/28/2016 by Dr. Madelyn Flavorsondell Smith Jill Johnson is a 75 y.o. female with medical history significant of HTN, HLD, CKD, CAD s/p CABG, AVR, anemia, depression, hypothyroidism; who presents with complaints of shortness of breath. History is obtained by the patient's son as the patient is currently upset and unable to gather herself to provide adequate history. Patient had just recently been seen by her nephrologist Dr. Marisue HumbleSanford yesterday, and was told that her fistula was ready to be used and that it was time to start dialysis. However, the patient wanted to wait until after Christmas. Since that time the patient has had complaints of intermittent chest pain and been very anxious stating that she cannot breathe. She describes the chest pain more so as pressure and tightness. Associated symptoms included increasing swelling in her bilateral lower extremities, more depressed, and easily fatigued. Patient has had no loss of consciousness, focal weakness, changes in vision, abdominal pain, nausea, vomiting, or fever. At this time she still makes urine, but the son is unsure if she took her Lasix this morning. Assessment & Plan   Acute respiratory distress -Appears to be improving -Likely secondary to hypervolemia secondary to ESRD vs anemia, with a component of anxiety -Presented with shortness of breath, elevated BNP, CXR showing probable CHF -Nephrology consulted and appreciated -Suspect will improve with HD and diuresis -Weaned off O2, sats on room air in the 90s -Continue nebs PRN, PRN ativan  Symptomatic anemia/Anemia of chronic disease/Possible GI bleed -hemoglobin upon admission 7.6, baseline 8-9 -hemoglobin dropped to 5.4, given 3uPRBCs -Current hemoglobin 7.4 -FOBT  + -Gastroenterology consulted and appreciated. Conservative management at this time given patient's current condition -patient has not had a colonoscopy or endoscopy  -Continue PPI -Aspirin and heparin discontinued -Plan for IV iron  Chest pain with elevated troponin -Suspect related to demand ischemia vs anemia and overload -Troponins 0.04 - 0.06 - 0.08 -Cardiology consulted and appreciated, echocardiogram pending -Stress test canceled - do as an outpatient  Anxiety/depression -Would benefit from SSRI -Continue Ativan PRN  Coronary artery disease s/p CABG/AVR -Continue aspirin  Essential hypertension -Continue Cozaar and amlodipine  Peripheral neuropathy/toe wounds -Suspect some aspect of peripheral vascular disease due to decreased circulation of lower extremities.  -Continue gabapentin -ABI and/or VVS consult recommended per nephrology   Left hand pain -Patient complained of left hand pain this morning- feels she slept on it.  -Denies current pain.  Hypothyroidism -Continue levothyroxine  Deconditioning -PT and OT consulted, recommended SNF -Social work consulted  DVT Prophylaxis  SCDs  Code Status: Full  Family Communication: None at bedside  Disposition Plan: Admitted. Patient will dialyze on 11/04/2016. D/c SNF when available.   Consultants Cardiology Nephrology Vascular surgery Gastroenterology   Procedures  None  Antibiotics   Anti-infectives    None      Subjective:   Jill Johnson seen and examined today. Feels breathing has improved, but feels weak.  Complains of leftDenies currently chest pain or shortness of breath.  Denies abdominal pain, N/V/D/C.  Objective:   Vitals:   11/03/16 0436 11/03/16 0727 11/03/16 0914 11/03/16 1113  BP: (!) 133/38 (!) 127/33  (!) 145/43  Pulse: (!) 54     Resp: 11     Temp: 97.9 F (36.6  C)  98.4 F (36.9 C)   TempSrc: Oral  Oral   SpO2: 97% 95%  96%  Weight: 69.2 kg (152 lb 9.6 oz)      Height:        Intake/Output Summary (Last 24 hours) at 11/03/16 1216 Last data filed at 11/03/16 0815  Gross per 24 hour  Intake              540 ml  Output                0 ml  Net              540 ml   Filed Weights   11/01/16 1035 11/02/16 0500 11/03/16 0436  Weight: 70.6 kg (155 lb 10.3 oz) 69.3 kg (152 lb 12.5 oz) 69.2 kg (152 lb 9.6 oz)    Exam  General: Well developed, well nourished, NAD, appears stated age  HEENT: NCAT, mucous membranes moist.   Cardiovascular: S1 S2 auscultated, 2/6 SEM, RRR  Respiratory: diminished but clear.  Abdomen: Soft, nontender, nondistended, + bowel sounds  Extremities: warm dry without cyanosis clubbing. No LE edema. LUE ecchymosis, AVF +Bruit  Neuro: AAOx3, nonfocal  Psych:Flat  Data Reviewed: I have personally reviewed following labs and imaging studies  CBC:  Recent Labs Lab 10/28/16 1545  10/31/16 0450 10/31/16 1618 11/01/16 0533 11/02/16 0813 11/03/16 0232  WBC 10.2  < > 11.9* 11.6* 10.3 9.4 8.9  NEUTROABS 8.6*  --   --   --   --   --   --   HGB 7.6*  < > 7.7* 7.7* 7.7* 7.6* 7.4*  HCT 23.8*  < > 22.6* 22.9* 23.0* 23.0* 22.3*  MCV 102.1*  < > 90.0 90.5 93.5 93.1 94.1  PLT 293  < > 186 202 192 214 206  < > = values in this interval not displayed. Basic Metabolic Panel:  Recent Labs Lab 10/29/16 0225 10/29/16 0835 10/30/16 0800 10/31/16 0450 11/01/16 0533 11/02/16 0813 11/03/16 0232  NA 140  --  141 138 136 134* 132*  K 4.9  --  4.6 3.5 4.0 3.5 3.7  CL 103  --  104 100* 97* 96* 95*  CO2 27  --  23 26 27 30 26   GLUCOSE 103*  --  108* 94 100* 112* 110*  BUN 131*  --  157* 88* 105* 59* 65*  CREATININE 4.86*  --  4.83* 3.77* 4.84* 4.30* 5.24*  CALCIUM 8.3*  --  8.2* 8.1* 8.0* 7.8* 7.7*  MG  --  2.3  --   --   --   --   --   PHOS 4.3  --  5.5*  --   --   --   --    GFR: Estimated Creatinine Clearance: 7.8 mL/min (by C-G formula based on SCr of 5.24 mg/dL (H)). Liver Function Tests:  Recent Labs Lab  10/28/16 1545 10/29/16 0225 10/30/16 0800  AST 24  --   --   ALT 15  --   --   ALKPHOS 37*  --   --   BILITOT 1.1  --   --   PROT 6.3*  --   --   ALBUMIN 3.1* 2.6* 2.2*   No results for input(s): LIPASE, AMYLASE in the last 168 hours. No results for input(s): AMMONIA in the last 168 hours. Coagulation Profile:  Recent Labs Lab 10/28/16 1545  INR 1.35   Cardiac Enzymes:  Recent Labs Lab 10/28/16 1545 10/28/16 2116  10/29/16 0225 10/29/16 0835  TROPONINI 0.04* 0.06* 0.08* 0.08*   BNP (last 3 results) No results for input(s): PROBNP in the last 8760 hours. HbA1C: No results for input(s): HGBA1C in the last 72 hours. CBG: No results for input(s): GLUCAP in the last 168 hours. Lipid Profile: No results for input(s): CHOL, HDL, LDLCALC, TRIG, CHOLHDL, LDLDIRECT in the last 72 hours. Thyroid Function Tests: No results for input(s): TSH, T4TOTAL, FREET4, T3FREE, THYROIDAB in the last 72 hours. Anemia Panel: No results for input(s): VITAMINB12, FOLATE, FERRITIN, TIBC, IRON, RETICCTPCT in the last 72 hours. Urine analysis:    Component Value Date/Time   COLORURINE YELLOW 10/28/2016 1600   APPEARANCEUR CLEAR 10/28/2016 1600   LABSPEC 1.011 10/28/2016 1600   PHURINE 6.5 10/28/2016 1600   GLUCOSEU NEGATIVE 10/28/2016 1600   HGBUR NEGATIVE 10/28/2016 1600   BILIRUBINUR NEGATIVE 10/28/2016 1600   KETONESUR NEGATIVE 10/28/2016 1600   PROTEINUR 30 (A) 10/28/2016 1600   UROBILINOGEN 1.0 01/30/2010 1104   NITRITE NEGATIVE 10/28/2016 1600   LEUKOCYTESUR NEGATIVE 10/28/2016 1600   Sepsis Labs: @LABRCNTIP (procalcitonin:4,lacticidven:4)  ) Recent Results (from the past 240 hour(s))  Urine culture     Status: None   Collection Time: 10/28/16  4:00 PM  Result Value Ref Range Status   Specimen Description URINE, CATHETERIZED  Final   Special Requests NONE  Final   Culture NO GROWTH Performed at [facility]   Final   Report Status 10/29/2016 FINAL  Final  MRSA PCR  Screening     Status: None   Collection Time: 10/28/16  8:57 PM  Result Value Ref Range Status   MRSA by PCR NEGATIVE NEGATIVE Final    Comment:        The GeneXpert MRSA Assay (FDA approved for NASAL specimens only), is one component of a comprehensive MRSA colonization surveillance program. It is not intended to diagnose MRSA infection nor to guide or monitor treatment for MRSA infections.       Radiology Studies: No results found.   Scheduled Meds: . sodium chloride   Intravenous Once  . sodium chloride   Intravenous Once  . amLODipine  10 mg Oral Daily  . cholecalciferol  2,000 Units Oral Daily  . [START ON 11/04/2016] darbepoetin (ARANESP) injection - DIALYSIS  150 mcg Intravenous Q Wed-HD  . gabapentin  300 mg Oral Daily  . levothyroxine  112 mcg Oral QAC breakfast  . losartan  50 mg Oral QHS  . pantoprazole  40 mg Oral BID  . vitamin B-12  100 mcg Oral Daily  . zinc oxide   Topical BID   Continuous Infusions:   LOS: 6 days   Time Spent in minutes   30 minutes  Skyrah Krupp D.O. on 11/03/2016 at 12:16 PM  Between 7am to 7pm - Pager - [phone]  After 7pm go to [email or web address] - password [email or web address]  And look for the night coverage person covering for me after hours  [facility] Office  [phone]

## 2016-11-03 NOTE — Progress Notes (Addendum)
S:  CO neuropathic pain Lt hand.  Hasn't been out of bed since admission according to pt O:BP (!) 133/38 (BP Location: Right Arm)   Pulse (!) 54   Temp 97.9 F (36.6 C) (Oral)   Resp 11   Ht 5' (1.524 m)   Wt 69.2 kg (152 lb 9.6 oz)   SpO2 97%   BMI 29.80 kg/m   Intake/Output Summary (Last 24 hours) at 11/03/16 0707 Last data filed at 11/02/16 2145  Gross per 24 hour  Intake              902 ml  Output                0 ml  Net              902 ml   Weight change: -3.781 kg (-8 lb 5.4 oz) WUJ:WJXBJGen:awake and alert CVS:RRR 3/6systolic M Resp:Few basilar crackles Abd:+ BS NTND Ext:No edema, mils ischemic changes of feet.  LUA AVF + bruit, ecchymosis.  Decreased Lt radial pulse NEURO:CNI, Ox3, No asterixis   . sodium chloride   Intravenous Once  . sodium chloride   Intravenous Once  . amLODipine  10 mg Oral Daily  . cholecalciferol  2,000 Units Oral Daily  . [START ON 11/04/2016] darbepoetin (ARANESP) injection - DIALYSIS  150 mcg Intravenous Q Wed-HD  . gabapentin  300 mg Oral Daily  . levothyroxine  112 mcg Oral QAC breakfast  . losartan  50 mg Oral QHS  . pantoprazole  40 mg Oral BID  . vitamin B-12  100 mcg Oral Daily  . zinc oxide   Topical BID   No results found. BMET    Component Value Date/Time   NA 132 (L) 11/03/2016 0232   K 3.7 11/03/2016 0232   CL 95 (L) 11/03/2016 0232   CO2 26 11/03/2016 0232   GLUCOSE 110 (H) 11/03/2016 0232   BUN 65 (H) 11/03/2016 0232   CREATININE 5.24 (H) 11/03/2016 0232   CALCIUM 7.7 (L) 11/03/2016 0232   CALCIUM 8.4 (L) 10/20/2016 0929   GFRNONAA 7 (L) 11/03/2016 0232   GFRAA 8 (L) 11/03/2016 0232   CBC    Component Value Date/Time   WBC 8.9 11/03/2016 0232   RBC 2.37 (L) 11/03/2016 0232   HGB 7.4 (L) 11/03/2016 0232   HCT 22.3 (L) 11/03/2016 0232   PLT 206 11/03/2016 0232   MCV 94.1 11/03/2016 0232   MCV 90.5 11/12/2014 1451   MCH 31.2 11/03/2016 0232   MCHC 33.2 11/03/2016 0232   RDW 19.3 (H) 11/03/2016 0232   LYMPHSABS  1.1 10/28/2016 1545   MONOABS 0.4 10/28/2016 1545   EOSABS 0.0 10/28/2016 1545   BASOSABS 0.0 10/28/2016 1545     Assessment: 1. New ESRD   HD tomorrow 2. Sec HPTH  PTH 120 3. Anemia on aranesp.  Fe Sat low 10/20/16.  Will give short course IV iron 4. HTN 5. Intermittent neuropathic pain Lt hand, follow for now  Plan: 1. HD tomorrow 2. IV iron 3. Needs some PT   Tirso Laws T

## 2016-11-03 NOTE — Progress Notes (Signed)
Physical Therapy Evaluation Patient Details Name: Jill Johnson MRN: [id number] DOB: Aug 02, 1941 Today's Date: 11/03/2016   History of Present Illness   Jill Johnson is a 75 y.o. female with medical history significant of HTN, HLD, CKD, CAD s/p CABG, AVR, anemia, depression, hypothyroidism; who presents with complaints of shortness of breath due to volume overload.  Clinical Impression  Pt admitted with/for SOB due to volume overload.  Pt significantly debilitated at this point, but can stand and take steps at moderate assist..  Pt currently limited functionally due to the problems listed. ( See problems list.)   Pt will benefit from PT to maximize function and safety in order to get ready for next venue listed below.     Follow Up Recommendations SNF    Equipment Recommendations  None recommended by PT    Recommendations for Other Services       Precautions / Restrictions Precautions Precautions: Fall      Mobility  Bed Mobility               General bed mobility comments: NT, pt in the chair  Transfers Overall transfer level: Needs assistance   Transfers: Sit to/from Stand Sit to Stand: Mod assist         General transfer comment: cues for hand placement, lifting assist  Ambulation/Gait Ambulation/Gait assistance: Min assist Ambulation Distance (Feet): 2 Feet (forward and  back) Assistive device: Rolling walker (2 wheeled) Gait Pattern/deviations: Step-through pattern;Step-to pattern;Decreased step length - right;Decreased step length - left;Decreased stride length     General Gait Details: weak-kneed and tentative  Careers information officertairs            Wheelchair Mobility    Modified Rankin (Stroke Patients Only)       Balance Overall balance assessment: Needs assistance Sitting-balance support: No upper extremity supported;Single extremity supported Sitting balance-Leahy Scale: Fair     Standing balance support: Bilateral upper extremity  supported Standing balance-Leahy Scale: Poor Standing balance comment: reliant on the RW                             Pertinent Vitals/Pain Pain Assessment: Faces Faces Pain Scale: Hurts little more Pain Location: joints, L hand Pain Descriptors / Indicators: Aching;Burning;Sore Pain Intervention(s): Limited activity within patient's tolerance;Monitored during session    Home Living Family/patient expects to be discharged to:: Private residence Living Arrangements: Alone Available Help at Discharge: Family;Available PRN/intermittently Type of Home: Mobile home Home Access: Stairs to enter;Ramped entrance Entrance Stairs-Rails: Right;Left Entrance Stairs-Number of Steps: 5 Home Layout: One level Home Equipment: Walker - 4 wheels;Bedside commode;Shower seat;Wheelchair - manual      Prior Function Level of Independence: Independent with assistive device(s)               Hand Dominance        Extremity/Trunk Assessment   Upper Extremity Assessment Upper Extremity Assessment: Defer to OT evaluation    Lower Extremity Assessment Lower Extremity Assessment: Generalized weakness       Communication   Communication: No difficulties  Cognition Arousal/Alertness: Awake/alert Behavior During Therapy: WFL for tasks assessed/performed Overall Cognitive Status: Within Functional Limits for tasks assessed                      General Comments General comments (skin integrity, edema, etc.): SpO2 on RA at 94/95% consistently with HR in upper 50's    Exercises     Assessment/Plan  PT Assessment Patient needs continued PT services  PT Problem List Decreased strength;Decreased activity tolerance;Decreased balance;Decreased mobility;Decreased knowledge of use of DME;Cardiopulmonary status limiting activity          PT Treatment Interventions Gait training;Functional mobility training;Therapeutic activities;Balance training;Patient/family  education;Stair training    PT Goals (Current goals can be found in the Care Plan section)  Acute Rehab PT Goals Patient Stated Goal: hope to get home PT Goal Formulation: With patient Time For Goal Achievement: 11/17/16 Potential to Achieve Goals: Fair    Frequency Min 3X/week   Barriers to discharge        Co-evaluation               End of Session   Activity Tolerance: Patient tolerated treatment well Patient left: in chair;with call bell/phone within reach Nurse Communication: Mobility status         Time: [id number] PT Time Calculation (min) (ACUTE ONLY): 35 min   Charges:   PT Evaluation $PT Eval Moderate Complexity: 1 Procedure PT Treatments $Therapeutic Activity: 8-22 mins   PT G CodesEliseo Gum:        Tawfiq Favila V Jenesys Casseus 11/03/2016, 10:46 AM 11/03/2016  [place] BingKen Yorley Buch, PT [phone]  (pager)

## 2016-11-03 NOTE — Progress Notes (Signed)
Updated report received via Adelina MingsKelsey RN, reviewed all new orders, meds, Vs and events of the day , assumed care of patient.

## 2016-11-04 LAB — BASIC METABOLIC PANEL
ANION GAP: 13 (ref 5–15)
BUN: 74 mg/dL — ABNORMAL HIGH (ref 6–20)
CALCIUM: 7.6 mg/dL — AB (ref 8.9–10.3)
CO2: 22 mmol/L (ref 22–32)
CREATININE: 5.51 mg/dL — AB (ref 0.44–1.00)
Chloride: 96 mmol/L — ABNORMAL LOW (ref 101–111)
GFR, EST AFRICAN AMERICAN: 8 mL/min — AB (ref >60)
GFR, EST NON AFRICAN AMERICAN: 7 mL/min — AB (ref >60)
GLUCOSE: 115 mg/dL — AB (ref 65–99)
Potassium: 3.7 mmol/L (ref 3.5–5.1)
Sodium: 131 mmol/L — ABNORMAL LOW (ref 135–145)

## 2016-11-04 LAB — CBC
HEMATOCRIT: 22 % — AB (ref 36.0–46.0)
Hemoglobin: 7.5 g/dL — ABNORMAL LOW (ref 12.0–15.0)
MCH: 31.9 pg (ref 26.0–34.0)
MCHC: 34.1 g/dL (ref 30.0–36.0)
MCV: 93.6 fL (ref 78.0–100.0)
PLATELETS: 216 10*3/uL (ref 150–400)
RBC: 2.35 MIL/uL — ABNORMAL LOW (ref 3.87–5.11)
RDW: 19.1 % — AB (ref 11.5–15.5)
WBC: 9.3 10*3/uL (ref 4.0–10.5)

## 2016-11-04 MED ORDER — SENNOSIDES-DOCUSATE SODIUM 8.6-50 MG PO TABS
1.0000 | ORAL_TABLET | Freq: Two times a day (BID) | ORAL | Status: DC
  Administered 2016-11-04 – 2016-11-07 (×5): 1 via ORAL
  Filled 2016-11-04 (×6): qty 1

## 2016-11-04 MED ORDER — DARBEPOETIN ALFA 150 MCG/0.3ML IJ SOSY
PREFILLED_SYRINGE | INTRAMUSCULAR | Status: AC
  Filled 2016-11-04: qty 0.3

## 2016-11-04 MED ORDER — ACETAMINOPHEN 325 MG PO TABS
ORAL_TABLET | ORAL | Status: AC
  Filled 2016-11-04: qty 2

## 2016-11-04 NOTE — Progress Notes (Signed)
S:  Eating well.  Still CO pain Lt hand O:BP (!) 141/41 (BP Location: Right Arm)   Pulse 61   Temp 97.8 F (36.6 C) (Oral)   Resp 14   Ht 5' (1.524 m)   Wt 68.3 kg (150 lb 9.6 oz)   SpO2 98%   BMI 29.41 kg/m   Intake/Output Summary (Last 24 hours) at 11/04/16 0713 Last data filed at 11/03/16 1350  Gross per 24 hour  Intake              360 ml  Output                0 ml  Net              360 ml   Weight change: -0.907 kg (-2 lb) NWG:NFAOZGen:awake and alert CVS:RRR 3/6systolic M Resp:Few basilar crackles Abd:+ BS NTND Ext:No edema, mild ischemic changes of feet.  LUA AVF + bruit, ecchymosis.  Decreased Lt radial pulse NEURO:CNI, Ox3, No asterixis   . sodium chloride   Intravenous Once  . sodium chloride   Intravenous Once  . amLODipine  10 mg Oral Daily  . cholecalciferol  2,000 Units Oral Daily  . darbepoetin (ARANESP) injection - DIALYSIS  150 mcg Intravenous Q Wed-HD  . gabapentin  300 mg Oral Daily  . levothyroxine  112 mcg Oral QAC breakfast  . losartan  50 mg Oral QHS  . pantoprazole  40 mg Oral BID  . vitamin B-12  100 mcg Oral Daily  . zinc oxide   Topical BID   No results found. BMET    Component Value Date/Time   NA 131 (L) 11/04/2016 0536   K 3.7 11/04/2016 0536   CL 96 (L) 11/04/2016 0536   CO2 22 11/04/2016 0536   GLUCOSE 115 (H) 11/04/2016 0536   BUN 74 (H) 11/04/2016 0536   CREATININE 5.51 (H) 11/04/2016 0536   CALCIUM 7.6 (L) 11/04/2016 0536   CALCIUM 8.4 (L) 10/20/2016 0929   GFRNONAA 7 (L) 11/04/2016 0536   GFRAA 8 (L) 11/04/2016 0536   CBC    Component Value Date/Time   WBC 9.3 11/04/2016 0536   RBC 2.35 (L) 11/04/2016 0536   HGB 7.5 (L) 11/04/2016 0536   HCT 22.0 (L) 11/04/2016 0536   PLT 216 11/04/2016 0536   MCV 93.6 11/04/2016 0536   MCV 90.5 11/12/2014 1451   MCH 31.9 11/04/2016 0536   MCHC 34.1 11/04/2016 0536   RDW 19.1 (H) 11/04/2016 0536   LYMPHSABS 1.1 10/28/2016 1545   MONOABS 0.4 10/28/2016 1545   EOSABS 0.0 10/28/2016 1545    BASOSABS 0.0 10/28/2016 1545     Assessment: 1. New ESRD   HD today 2. Sec HPTH  PTH 120 3. Anemia on aranesp.  Fe Sat low 10/20/16.  Will give short course IV iron 4. HTN 5. Intermittent neuropathic pain Lt hand, follow for now  Plan: 1. HD today.  Will keep an eye on hand pain and if persists worsens then may need AVF ligated 2. [facility] [facility] but will need some sort of rehab   Rae Sutcliffe T

## 2016-11-04 NOTE — Progress Notes (Signed)
PT Cancellation Note  Patient Details Name: Jill Johnson L Rindfleisch MRN: [id number] DOB: 09/19/1941   Cancelled Treatment:    Reason Eval/Treat Not Completed: Patient at procedure or test/unavailable. Will check back later as time allows.    Toiya Morrish L Kilan Banfill 11/04/2016, 9:47 AM

## 2016-11-04 NOTE — Care Management Note (Addendum)
Case Management Note  Patient Details  Name: Jill Johnson MRN: [id number] Date of Birth: Nov 06, 1941  Subjective/Objective:  Pt presented for Shortness of Breath. New diagnosis for ESRD. Initiated on HD- Clip in Process. Plan will be for SNF once stable for d/c.                   Action/Plan: CSW assisting with disposition needs. No further needs from CM at this time.   Expected Discharge Date:                  Expected Discharge Plan:  Skilled Nursing Facility  In-House Referral:  Clinical Social Work  Discharge planning Services  CM Consult  Post Acute Care Choice:  NA Choice offered to:  NA  DME Arranged:  N/A DME Agency:  NA  HH Arranged:  NA HH Agency:  NA  Status of Service:  Completed, signed off  If discussed at Long Length of Stay Meetings, dates discussed: 11-05-16    Additional Comments: 1055 11-05-16 Tomi BambergerBrenda Graves-Bigelow, RN,BSN [phone] Pt is agreeable to [facility]. [facility]. CM will continue to monitor.  Gala LewandowskyGraves-Bigelow, Artisha Capri Kaye, RN 11/04/2016, 3:53 PM

## 2016-11-04 NOTE — Progress Notes (Signed)
PROGRESS NOTE    Jill Johnson  FAO:[id number] DOB: [birth year] DOA: 10/28/2016 PCP: Hollice Espy, MD   Chief Complaint  Patient presents with  . Shortness of Breath    anxiety     Brief Narrative:  HPI on 10/28/2016 by Dr. Madelyn Flavors Jill Johnson is a 75 y.o. female with medical history significant of HTN, HLD, CKD, CAD s/p CABG, AVR, anemia, depression, hypothyroidism; who presents with complaints of shortness of breath. History is obtained by the patient's son as the patient is currently upset and unable to gather herself to provide adequate history. Patient had just recently been seen by her nephrologist Dr. Marisue Humble yesterday, and was told that her fistula was ready to be used and that it was time to start dialysis. However, the patient wanted to wait until after Christmas. Since that time the patient has had complaints of intermittent chest pain and been very anxious stating that she cannot breathe. She describes the chest pain more so as pressure and tightness. Associated symptoms included increasing swelling in her bilateral lower extremities, more depressed, and easily fatigued. Patient has had no loss of consciousness, focal weakness, changes in vision, abdominal pain, nausea, vomiting, or fever. At this time she still makes urine, but the son is unsure if she took her Lasix this morning.  Assessment & Plan   Acute respiratory distress -Appears to be improving -Likely secondary to hypervolemia secondary to ESRD vs anemia, with a component of anxiety -Presented with shortness of breath, elevated BNP, CXR showing probable CHF -Nephrology consulted and appreciated -Suspect will improve with HD and diuresis -Weaned off O2, sats on room air in the 90s -Continue nebs PRN, PRN ativan  New ESRD - HD planned for 12/27.   Symptomatic anemia/Anemia of chronic disease/Possible GI bleed -hemoglobin upon admission 7.6, baseline 8-9 -hemoglobin dropped to 5.4, given  3uPRBCs -Current hemoglobin 7.4 -FOBT + -Gastroenterology consulted and appreciated. Conservative management at this time given patient's current condition -patient has not had a colonoscopy or endoscopy  -Continue PPI -Aspirin and heparin discontinued -Plan for IV iron per nephrology team, follow CBC  Chest pain with elevated troponin -Suspect related to demand ischemia vs anemia and overload -Troponins 0.04 - 0.06 - 0.08 -Cardiology consulted and appreciated, echocardiogram: - Moderate LVH with LVEF 60-65% and grade 2 diastolic dysfunction.   Severe left atrial enlargement. Moderately calcified mitral annulus with thickened leaflets and mild to moderate mitral regurgitation. 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  Trivial aortic regurgitation noted. Gradients have increased compared to the prior study with mean gradient 29 mmHg, although   the dimensionless index does not suggest prosthetic valve stenosis. Trivial tricuspid regurgitation, unable to assess PASP. -Stress test canceled - do as an outpatient  Anxiety/depression -Would benefit from SSRI -Continue Ativan PRN  Coronary artery disease s/p CABG/AVR -Continue aspirin  Essential hypertension -Continue Cozaar and amlodipine  Peripheral neuropathy/toe wounds -Suspect some aspect of peripheral vascular disease due to decreased circulation of lower extremities.  -Continue gabapentin -ABI and/or VVS consult recommended per nephrology   Left hand pain -Patient complained of left hand pain this morning- feels she slept on it.  -Denies current pain.  Hypothyroidism -Continue levothyroxine  Deconditioning -PT and OT consulted, recommended SNF -Social work consulted  DVT Prophylaxis  SCDs  Code Status: Full  Family Communication: None at bedside  Disposition Plan: Admitted. Patient will dialyze on 11/04/2016. D/c SNF when available.   Consultants Cardiology Nephrology Vascular surgery Gastroenterology    Procedures  None  Antibiotics   Anti-infectives    None      Subjective:   Jill Johnson seen and examined today.  Pt having persistent left hand pain.  Objective:   Vitals:   11/04/16 1059 11/04/16 1115 11/04/16 1135 11/04/16 1222  BP: (!) 79/40 (!) 84/49 (!) 139/53 (!) 157/42  Pulse: (!) 55 (!) 58 (!) 58 (!) 57  Resp: 13 14 15 14   Temp:   97.5 F (36.4 C) 97.6 F (36.4 C)  TempSrc:   Oral Oral  SpO2:   98% 100%  Weight:   70.8 kg (156 lb 1.4 oz)   Height:        Intake/Output Summary (Last 24 hours) at 11/04/16 1235 Last data filed at 11/04/16 1135  Gross per 24 hour  Intake              120 ml  Output             1900 ml  Net            -1780 ml   Filed Weights   11/04/16 0409 11/04/16 0810 11/04/16 1135  Weight: 68.3 kg (150 lb 9.6 oz) 72.7 kg (160 lb 4.4 oz) 70.8 kg (156 lb 1.4 oz)    Exam  General: Well developed, well nourished, NAD, appears stated age  HEENT: NCAT, mucous membranes moist.   Cardiovascular: S1 S2 auscultated, 2/6 SEM, RRR  Respiratory: diminished but clear.  Abdomen: Soft, nontender, nondistended, + bowel sounds  Extremities: warm dry without cyanosis clubbing. No LE edema. LUE ecchymosis, AVF +Bruit  Neuro: AAOx3, nonfocal  Psych:Flat  Data Reviewed: I have personally reviewed following labs and imaging studies  CBC:  Recent Labs Lab 10/28/16 1545  11/01/16 0533 11/02/16 0813 11/03/16 0232 11/03/16 2043 11/04/16 0536  WBC 10.2  < > 10.3 9.4 8.9 8.6 9.3  NEUTROABS 8.6*  --   --   --   --   --   --   HGB 7.6*  < > 7.7* 7.6* 7.4* 7.5* 7.5*  HCT 23.8*  < > 23.0* 23.0* 22.3* 22.2* 22.0*  MCV 102.1*  < > 93.5 93.1 94.1 96.5 93.6  PLT 293  < > 192 214 206 193 216  < > = values in this interval not displayed. Basic Metabolic Panel:  Recent Labs Lab 10/29/16 0225 10/29/16 0835 10/30/16 0800  11/01/16 0533 11/02/16 0813 11/03/16 0232 11/03/16 2043 11/04/16 0536  NA 140  --  141  < > 136 134* 132* 131* 131*  K  4.9  --  4.6  < > 4.0 3.5 3.7 3.9 3.7  CL 103  --  104  < > 97* 96* 95* 95* 96*  CO2 27  --  23  < > 27 30 26 24 22   GLUCOSE 103*  --  108*  < > 100* 112* 110* 122* 115*  BUN 131*  --  157*  < > 105* 59* 65* 72* 74*  CREATININE 4.86*  --  4.83*  < > 4.84* 4.30* 5.24* 5.33* 5.51*  CALCIUM 8.3*  --  8.2*  < > 8.0* 7.8* 7.7* 7.4* 7.6*  MG  --  2.3  --   --   --   --   --   --   --   PHOS 4.3  --  5.5*  --   --   --   --  7.1*  --   < > = values in this interval not displayed. GFR: Estimated Creatinine  Clearance: 7.5 mL/min (by C-G formula based on SCr of 5.51 mg/dL (H)). Liver Function Tests:  Recent Labs Lab 10/28/16 1545 10/29/16 0225 10/30/16 0800 11/03/16 2043  AST 24  --   --   --   ALT 15  --   --   --   ALKPHOS 37*  --   --   --   BILITOT 1.1  --   --   --   PROT 6.3*  --   --   --   ALBUMIN 3.1* 2.6* 2.2* 2.3*   No results for input(s): LIPASE, AMYLASE in the last 168 hours. No results for input(s): AMMONIA in the last 168 hours. Coagulation Profile:  Recent Labs Lab 10/28/16 1545  INR 1.35   Cardiac Enzymes:  Recent Labs Lab 10/28/16 1545 10/28/16 2116 10/29/16 0225 10/29/16 0835  TROPONINI 0.04* 0.06* 0.08* 0.08*   BNP (last 3 results) No results for input(s): PROBNP in the last 8760 hours. HbA1C: No results for input(s): HGBA1C in the last 72 hours. CBG: No results for input(s): GLUCAP in the last 168 hours. Lipid Profile: No results for input(s): CHOL, HDL, LDLCALC, TRIG, CHOLHDL, LDLDIRECT in the last 72 hours. Thyroid Function Tests: No results for input(s): TSH, T4TOTAL, FREET4, T3FREE, THYROIDAB in the last 72 hours. Anemia Panel: No results for input(s): VITAMINB12, FOLATE, FERRITIN, TIBC, IRON, RETICCTPCT in the last 72 hours. Urine analysis:    Component Value Date/Time   COLORURINE YELLOW 10/28/2016 1600   APPEARANCEUR CLEAR 10/28/2016 1600   LABSPEC 1.011 10/28/2016 1600   PHURINE 6.5 10/28/2016 1600   GLUCOSEU NEGATIVE 10/28/2016  1600   HGBUR NEGATIVE 10/28/2016 1600   BILIRUBINUR NEGATIVE 10/28/2016 1600   KETONESUR NEGATIVE 10/28/2016 1600   PROTEINUR 30 (A) 10/28/2016 1600   UROBILINOGEN 1.0 01/30/2010 1104   NITRITE NEGATIVE 10/28/2016 1600   LEUKOCYTESUR NEGATIVE 10/28/2016 1600    Recent Results (from the past 240 hour(s))  Urine culture     Status: None   Collection Time: 10/28/16  4:00 PM  Result Value Ref Range Status   Specimen Description URINE, CATHETERIZED  Final   Special Requests NONE  Final   Culture NO GROWTH [facility]   Final   Report Status 10/29/2016 FINAL  Final  MRSA PCR Screening     Status: None   Collection Time: 10/28/16  8:57 PM  Result Value Ref Range Status   MRSA by PCR NEGATIVE NEGATIVE Final    Comment:        The GeneXpert MRSA Assay (FDA approved for NASAL specimens only), is one component of a comprehensive MRSA colonization surveillance program. It is not intended to diagnose MRSA infection nor to guide or monitor treatment for MRSA infections.     Radiology Studies: No results found.  Scheduled Meds: . sodium chloride   Intravenous Once  . sodium chloride   Intravenous Once  . amLODipine  10 mg Oral Daily  . cholecalciferol  2,000 Units Oral Daily  . darbepoetin (ARANESP) injection - DIALYSIS  150 mcg Intravenous Q Wed-HD  . gabapentin  300 mg Oral Daily  . levothyroxine  112 mcg Oral QAC breakfast  . losartan  50 mg Oral QHS  . pantoprazole  40 mg Oral BID  . vitamin B-12  100 mcg Oral Daily  . zinc oxide   Topical BID   Continuous Infusions:   LOS: 7 days   Time Spent in minutes   30 minutes  Clanford Johnson MD on  11/04/2016 at 12:35 PM  Between 7am to 7pm - Pager - [phone]  After 7pm go to [email or web address] - password [email or web address]  And look for the night coverage person covering for me after hours  [facility] Office  [phone]

## 2016-11-05 LAB — RENAL FUNCTION PANEL
ALBUMIN: 2.2 g/dL — AB (ref 3.5–5.0)
Anion gap: 10 (ref 5–15)
BUN: 44 mg/dL — AB (ref 6–20)
CALCIUM: 7.4 mg/dL — AB (ref 8.9–10.3)
CO2: 23 mmol/L (ref 22–32)
CREATININE: 3.79 mg/dL — AB (ref 0.44–1.00)
Chloride: 97 mmol/L — ABNORMAL LOW (ref 101–111)
GFR calc Af Amer: 12 mL/min — ABNORMAL LOW (ref >60)
GFR calc non Af Amer: 11 mL/min — ABNORMAL LOW (ref >60)
GLUCOSE: 101 mg/dL — AB (ref 65–99)
Phosphorus: 5 mg/dL — ABNORMAL HIGH (ref 2.5–4.6)
Potassium: 3.9 mmol/L (ref 3.5–5.1)
Sodium: 130 mmol/L — ABNORMAL LOW (ref 135–145)

## 2016-11-05 LAB — CBC
HCT: 22.6 % — ABNORMAL LOW (ref 36.0–46.0)
HEMOGLOBIN: 7.4 g/dL — AB (ref 12.0–15.0)
MCH: 31.8 pg (ref 26.0–34.0)
MCHC: 32.7 g/dL (ref 30.0–36.0)
MCV: 97 fL (ref 78.0–100.0)
PLATELETS: 186 10*3/uL (ref 150–400)
RBC: 2.33 MIL/uL — ABNORMAL LOW (ref 3.87–5.11)
RDW: 19.9 % — AB (ref 11.5–15.5)
WBC: 10 10*3/uL (ref 4.0–10.5)

## 2016-11-05 NOTE — Progress Notes (Signed)
PROGRESS NOTE    Jill Johnson  ZOX:[id number] DOB: [birth year] DOA: 10/28/2016 PCP: Hollice Espy, MD   Chief Complaint  Patient presents with  . Shortness of Breath    anxiety     Brief Narrative:  HPI on 10/28/2016 by Dr. Madelyn Flavors Jill Johnson is a 75 y.o. female with medical history significant of HTN, HLD, CKD, CAD s/p CABG, AVR, anemia, depression, hypothyroidism; who presents with complaints of shortness of breath. History is obtained by the patient's son as the patient is currently upset and unable to gather herself to provide adequate history. Patient had just recently been seen by her nephrologist Dr. Marisue Humble yesterday, and was told that her fistula was ready to be used and that it was time to start dialysis. However, the patient wanted to wait until after Christmas. Since that time the patient has had complaints of intermittent chest pain and been very anxious stating that she cannot breathe. She describes the chest pain more so as pressure and tightness. Associated symptoms included increasing swelling in her bilateral lower extremities, more depressed, and easily fatigued. Patient has had no loss of consciousness, focal weakness, changes in vision, abdominal pain, nausea, vomiting, or fever. At this time she still makes urine, but the son is unsure if she took her Lasix this morning.  Assessment & Plan   Acute respiratory distress -Improving daily -Likely secondary to hypervolemia secondary to ESRD vs anemia, with a component of anxiety -Presented with shortness of breath, elevated BNP, CXR showing probable CHF -Nephrology consulted and appreciated -Suspect will improve with HD and diuresis -Weaned off O2, sats on room air in the 90s -Continue nebs PRN, PRN ativan  New ESRD - HD planned for 12/27. Repeat HD 12/29.  Arranging for outpatient HD center.   Symptomatic anemia/Anemia of chronic disease/Possible GI bleed -hemoglobin upon admission 7.6, baseline  8-9 -hemoglobin dropped to 5.4, given 3uPRBCs -Current hemoglobin 7.4 -FOBT + -Gastroenterology consulted and appreciated. Conservative management at this time given patient's current condition -patient has not had a colonoscopy or endoscopy  -Continue PPI -Aspirin and heparin discontinued -Plan for IV iron per nephrology team, follow CBC  Chest pain with elevated troponin -Suspect related to demand ischemia vs anemia and overload -Troponins 0.04 - 0.06 - 0.08 -Cardiology consulted and appreciated, echocardiogram: - Moderate LVH with LVEF 60-65% and grade 2 diastolic dysfunction.   Severe left atrial enlargement. Moderately calcified mitral annulus with thickened leaflets and mild to moderate mitral regurgitation. 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  Trivial aortic regurgitation noted. Gradients have increased compared to the prior study with mean gradient 29 mmHg, although   the dimensionless index does not suggest prosthetic valve stenosis. Trivial tricuspid regurgitation, unable to assess PASP. -Stress test canceled - do as an outpatient  Anxiety/depression -Would benefit from SSRI -Continue Ativan PRN  Coronary artery disease s/p CABG/AVR -Continue aspirin  Essential hypertension -Continue Cozaar and amlodipine  Peripheral neuropathy/toe wounds -Suspect some aspect of peripheral vascular disease due to decreased circulation of lower extremities.  -Continue gabapentin -ABI and/or VVS consult recommended per nephrology   Left hand pain -Patient complained of left hand pain this morning- feels she slept on it.  -Denies current pain.  Hypothyroidism -Continue levothyroxine  Deconditioning -PT and OT consulted, recommended SNF -Social work consulted  DVT Prophylaxis  SCDs  Code Status: Full  Family Communication: None at bedside  Disposition Plan: Admitted. Patient will dialyze on 11/04/2016 and 12/29. D/c SNF when available. Social Programme researcher, broadcasting/film/video.  Consultants Cardiology Nephrology Vascular surgery Gastroenterology   Procedures  None  Antibiotics   Anti-infectives    None      Subjective:   Jill RobsonNancy Johnson seen and examined today.  Pt sleepy but easily arousable, no complaints.  Objective:   Vitals:   11/04/16 2108 11/05/16 0005 11/05/16 0408 11/05/16 0750  BP: (!) 155/45 (!) 145/51 (!) 131/53 (!) 136/56  Pulse: 63 64 (!) 56 (!) 54  Resp: 17 16 13 15   Temp: 97.8 F (36.6 C) 97.8 F (36.6 C) 98.1 F (36.7 C) 98.9 F (37.2 C)  TempSrc: Oral Oral Oral Oral  SpO2: 98% 98% 97% 99%  Weight:   71.8 kg (158 lb 6.4 oz)   Height:        Intake/Output Summary (Last 24 hours) at 11/05/16 0934 Last data filed at 11/05/16 16100823  Gross per 24 hour  Intake              842 ml  Output             1975 ml  Net            -1133 ml   Filed Weights   11/04/16 0810 11/04/16 1135 11/05/16 0408  Weight: 72.7 kg (160 lb 4.4 oz) 70.8 kg (156 lb 1.4 oz) 71.8 kg (158 lb 6.4 oz)    Exam  General: Well developed, well nourished, NAD, appears stated age  HEENT: NCAT, mucous membranes moist.   Cardiovascular: S1 S2 auscultated, 2/6 SEM, RRR  Respiratory: diminished but clear.  Abdomen: Soft, nontender, nondistended, + bowel sounds  Extremities: warm dry without cyanosis clubbing. No LE edema. LUE ecchymosis, AVF +Bruit  Neuro: AAOx3, nonfocal  Psych:Flat  Data Reviewed: I have personally reviewed following labs and imaging studies  CBC:  Recent Labs Lab 11/02/16 0813 11/03/16 0232 11/03/16 2043 11/04/16 0536 11/05/16 0258  WBC 9.4 8.9 8.6 9.3 10.0  HGB 7.6* 7.4* 7.5* 7.5* 7.4*  HCT 23.0* 22.3* 22.2* 22.0* 22.6*  MCV 93.1 94.1 96.5 93.6 97.0  PLT 214 206 193 216 186   Basic Metabolic Panel:  Recent Labs Lab 10/30/16 0800  11/02/16 0813 11/03/16 0232 11/03/16 2043 11/04/16 0536 11/05/16 0258  NA 141  < > 134* 132* 131* 131* 130*  K 4.6  < > 3.5 3.7 3.9 3.7 3.9  CL 104  < > 96* 95* 95* 96* 97*  CO2  23  < > 30 26 24 22 23   GLUCOSE 108*  < > 112* 110* 122* 115* 101*  BUN 157*  < > 59* 65* 72* 74* 44*  CREATININE 4.83*  < > 4.30* 5.24* 5.33* 5.51* 3.79*  CALCIUM 8.2*  < > 7.8* 7.7* 7.4* 7.6* 7.4*  PHOS 5.5*  --   --   --  7.1*  --  5.0*  < > = values in this interval not displayed. GFR: Estimated Creatinine Clearance: 11 mL/min (by C-G formula based on SCr of 3.79 mg/dL (H)). Liver Function Tests:  Recent Labs Lab 10/30/16 0800 11/03/16 2043 11/05/16 0258  ALBUMIN 2.2* 2.3* 2.2*   No results for input(s): LIPASE, AMYLASE in the last 168 hours. No results for input(s): AMMONIA in the last 168 hours. Coagulation Profile: No results for input(s): INR, PROTIME in the last 168 hours. Cardiac Enzymes: No results for input(s): CKTOTAL, CKMB, CKMBINDEX, TROPONINI in the last 168 hours. BNP (last 3 results) No results for input(s): PROBNP in the last 8760 hours. HbA1C: No results for input(s): HGBA1C in the  last 72 hours. CBG: No results for input(s): GLUCAP in the last 168 hours. Lipid Profile: No results for input(s): CHOL, HDL, LDLCALC, TRIG, CHOLHDL, LDLDIRECT in the last 72 hours. Thyroid Function Tests: No results for input(s): TSH, T4TOTAL, FREET4, T3FREE, THYROIDAB in the last 72 hours. Anemia Panel: No results for input(s): VITAMINB12, FOLATE, FERRITIN, TIBC, IRON, RETICCTPCT in the last 72 hours. Urine analysis:    Component Value Date/Time   COLORURINE YELLOW 10/28/2016 1600   APPEARANCEUR CLEAR 10/28/2016 1600   LABSPEC 1.011 10/28/2016 1600   PHURINE 6.5 10/28/2016 1600   GLUCOSEU NEGATIVE 10/28/2016 1600   HGBUR NEGATIVE 10/28/2016 1600   BILIRUBINUR NEGATIVE 10/28/2016 1600   KETONESUR NEGATIVE 10/28/2016 1600   PROTEINUR 30 (A) 10/28/2016 1600   UROBILINOGEN 1.0 01/30/2010 1104   NITRITE NEGATIVE 10/28/2016 1600   LEUKOCYTESUR NEGATIVE 10/28/2016 1600    Recent Results (from the past 240 hour(s))  Urine culture     Status: None   Collection Time:  10/28/16  4:00 PM  Result Value Ref Range Status   Specimen Description URINE, CATHETERIZED  Final   Special Requests NONE  Final   Culture NO GROWTH [facility]   Final   Report Status 10/29/2016 FINAL  Final  MRSA PCR Screening     Status: None   Collection Time: 10/28/16  8:57 PM  Result Value Ref Range Status   MRSA by PCR NEGATIVE NEGATIVE Final    Comment:        The GeneXpert MRSA Assay (FDA approved for NASAL specimens only), is one component of a comprehensive MRSA colonization surveillance program. It is not intended to diagnose MRSA infection nor to guide or monitor treatment for MRSA infections.     Radiology Studies: No results found.  Scheduled Meds: . sodium chloride   Intravenous Once  . sodium chloride   Intravenous Once  . amLODipine  10 mg Oral Daily  . cholecalciferol  2,000 Units Oral Daily  . darbepoetin (ARANESP) injection - DIALYSIS  150 mcg Intravenous Q Wed-HD  . gabapentin  300 mg Oral Daily  . levothyroxine  112 mcg Oral QAC breakfast  . losartan  50 mg Oral QHS  . pantoprazole  40 mg Oral BID  . senna-docusate  1 tablet Oral BID  . vitamin B-12  100 mcg Oral Daily  . zinc oxide   Topical BID   Continuous Infusions:   LOS: 8 days   Time Spent in minutes   30 minutes  Deanie Jupiter MD on 11/05/2016 at 9:34 AM  Between 7am to 7pm - Pager - [phone]  After 7pm go to [email or web address] - password [email or web address]  And look for the night coverage person covering for me after hours  [facility] Office  [phone]

## 2016-11-05 NOTE — Progress Notes (Signed)
Occupational Therapy Treatment Patient Details Name: Jill Johnson MRN: [id number] DOB: 1941/08/20 Today's Date: 11/05/2016    History of present illness  Jill Johnson is a 75 y.o. female with medical history significant of HTN, HLD, CKD, CAD s/p CABG, AVR, anemia, depression, hypothyroidism; who presents with complaints of shortness of breath due to volume overload.   OT comments  Pt tolerating increased mobility today; able to perform toilet transfer ambulating ~10 feet to [facility] with min assist +2 for safety. Requires max assist for LB dressing, peri care, and grooming. D/c plan remains appropriate. Will continue to follow acutely.   Follow Up Recommendations  SNF    Equipment Recommendations  3 in 1 bedside commode;Hospital bed;Other (comment) (RW)    Recommendations for Other Services      Precautions / Restrictions Precautions Precautions: Fall Restrictions Weight Bearing Restrictions: No       Mobility Bed Mobility Overal bed mobility: Needs Assistance Bed Mobility: Supine to Sit     Supine to sit: Min guard;HOB elevated     General bed mobility comments: Increased time required  Transfers Overall transfer level: Needs assistance Equipment used: Rolling walker (2 wheeled) Transfers: Sit to/from Stand Sit to Stand: Min assist;+2 safety/equipment         General transfer comment: Despite cues pt pulling up with bil UEs on RW    Balance Overall balance assessment: Needs assistance Sitting-balance support: Feet supported;No upper extremity supported Sitting balance-Leahy Scale: Fair     Standing balance support: Bilateral upper extremity supported Standing balance-Leahy Scale: Poor                     ADL Overall ADL's : Needs assistance/impaired     Grooming: Maximal assistance;Brushing hair;Sitting Grooming Details (indicate cue type and reason): pt reports "I cant" due decresed L hand coordination         Upper Body Dressing : Minimal  assistance;Sitting Upper Body Dressing Details (indicate cue type and reason): to doff/don gown Lower Body Dressing: Maximal assistance;Sit to/from stand Lower Body Dressing Details (indicate cue type and reason): to don/doff brief and socks Toilet Transfer: Minimal assistance;+2 for safety/equipment;Ambulation;BSC;RW Toilet Transfer Details (indicate cue type and reason): BSC ~10 feet from EOB Toileting- Clothing Manipulation and Hygiene: Total assistance;Sit to/from stand Toileting - Clothing Manipulation Details (indicate cue type and reason): +2 for safety with sit to stand     Functional mobility during ADLs: Minimal assistance;+2 for safety/equipment;Rolling walker General ADL Comments: Cues for safety and technique throughout. Pt needs significant verbal encouragement throughout session.      Vision                     Perception     Praxis      Cognition   Behavior During Therapy: WFL for tasks assessed/performed Overall Cognitive Status: Within Functional Limits for tasks assessed                  General Comments: Pt tearful at times when she realized she could walk after 6 days    Extremity/Trunk Assessment               Exercises     Shoulder Instructions       General Comments      Pertinent Vitals/ Pain       Pain Assessment: Faces Faces Pain Scale: Hurts even more Pain Location: bottom, R ankle (blister) Pain Descriptors / Indicators: Grimacing;Sore Pain Intervention(s): Monitored during session;Repositioned  Home Living                                          Prior Functioning/Environment              Frequency  Min 2X/week        Progress Toward Goals  OT Goals(current goals can now be found in the care plan section)  Progress towards OT goals: Progressing toward goals  Acute Rehab OT Goals Patient Stated Goal: rehab then home OT Goal Formulation: With patient  Plan Discharge plan remains  appropriate    Co-evaluation    PT/OT/SLP Co-Evaluation/Treatment: Yes Reason for Co-Treatment: For patient/therapist safety   OT goals addressed during session: ADL's and self-care      End of Session Equipment Utilized During Treatment: Gait belt;Rolling walker   Activity Tolerance Patient tolerated treatment well   Patient Left in chair;with call bell/phone within reach;with chair alarm set   Nurse Communication Mobility status        Time: [id number] OT Time Calculation (min): 28 min  Charges: OT General Charges $OT Visit: 1 Procedure OT Treatments $Self Care/Home Management : 8-22 mins  Gaye AlkenBailey A Zamzam Whinery M.S., OTR/L Pager: [phone]  11/05/2016, 1:46 PM

## 2016-11-05 NOTE — Progress Notes (Signed)
Physical Therapy Treatment Patient Details Name: Jill Johnson MRN: [id number] DOB: [birth year] Today's Date: 11/05/2016    History of Present Illness  Jill Johnson is a 75 y.o. female with medical history significant of HTN, HLD, CKD, CAD s/p CABG, AVR, anemia, depression, hypothyroidism; who presents with complaints of shortness of breath due to volume overload.    PT Comments    Patient progressing well towards PT goals. Tolerated gait training with Min A for balance/safety. Pt emotionally labile during session crying after walking x2. Eager to get to rehab. Requires assist for standing due to weakness in BLEs. Will continue to follow.  Follow Up Recommendations  SNF     Equipment Recommendations  None recommended by PT    Recommendations for Other Services       Precautions / Restrictions Precautions Precautions: Fall Precaution Comments: emotionally labile Restrictions Weight Bearing Restrictions: No    Mobility  Bed Mobility Overal bed mobility: Needs Assistance Bed Mobility: Supine to Sit     Supine to sit: Min guard;HOB elevated     General bed mobility comments: Increased time required  Transfers Overall transfer level: Needs assistance Equipment used: Rolling walker (2 wheeled) Transfers: Sit to/from Stand Sit to Stand: Min assist;+2 safety/equipment         General transfer comment: Min A to boost from EOB x1, from [facility] x[facility], pulling up on RW despite cues to push up  Ambulation/Gait Ambulation/Gait assistance: Min assist Ambulation Distance (Feet): 10 Feet (x2 bouts) Assistive device: Rolling walker (2 wheeled) Gait Pattern/deviations: Step-through pattern;Step-to pattern;Decreased step length - right;Decreased step length - left;Decreased stride length Gait velocity: decreased   General Gait Details: Slow, unsteady gait with bil knee instability. Fast gait speed initially. Crying in joy after walking.   Stairs            Wheelchair  Mobility    Modified Rankin (Stroke Patients Only)       Balance Overall balance assessment: Needs assistance Sitting-balance support: Feet supported;No upper extremity supported Sitting balance-Leahy Scale: Fair Sitting balance - Comments: Total A for pericare, not able/unwilling to donn underwear    Standing balance support: During functional activity;Bilateral upper extremity supported Standing balance-Leahy Scale: Poor Standing balance comment: Reliant on BUEs for support in standing.                    Cognition Arousal/Alertness: Awake/alert Behavior During Therapy:  (Emotionally labile) Overall Cognitive Status: Within Functional Limits for tasks assessed                 General Comments: Pt tearful at times when she realized she could walk after 6 days. Emotionally labile. "Praise Jesus"    Exercises      General Comments General comments (skin integrity, edema, etc.): VSS throughout.      Pertinent Vitals/Pain Pain Assessment: Faces Faces Pain Scale: Hurts even more Pain Location: bottom, R ankle (blister) Pain Descriptors / Indicators: Grimacing;Sore Pain Intervention(s): Monitored during session    Home Living                      Prior Function            PT Goals (current goals can now be found in the care plan section) Acute Rehab PT Goals Patient Stated Goal: rehab then home Progress towards PT goals: Progressing toward goals    Frequency    Min 3X/week      PT Plan Current plan remains  appropriate    Co-evaluation   Reason for Co-Treatment: For patient/therapist safety   OT goals addressed during session: ADL's and self-care     End of Session Equipment Utilized During Treatment: Gait belt Activity Tolerance: Patient tolerated treatment well Patient left: in chair;with call bell/phone within reach;with chair alarm set     Time: [id number] PT Time Calculation (min) (ACUTE ONLY): 28 min  Charges:  $Gait  Training: 8-22 mins                    G Codes:      Jill Johnson 11/05/2016, 2:33 PM Jill Johnson, PT, DPT [phone]

## 2016-11-05 NOTE — Progress Notes (Signed)
S: No new CO O:BP (!) 131/53 (BP Location: Right Arm)   Pulse (!) 56   Temp 98.1 F (36.7 C) (Oral)   Resp 13   Ht 5' (1.524 m)   Wt 71.8 kg (158 lb 6.4 oz)   SpO2 97%   BMI 30.94 kg/m   Intake/Output Summary (Last 24 hours) at 11/05/16 0708 Last data filed at 11/04/16 1700  Gross per 24 hour  Intake              702 ml  Output             1975 ml  Net            -1273 ml   Weight change: 4.388 kg (9 lb 10.8 oz) ACZ:YSAYTGen:awake and alert CVS:RRR 3/6systolic M Resp:Few basilar crackles Abd:+ BS NTND Ext:No edema, mild ischemic changes of feet.  LUA AVF + bruit, ecchymosis.  Decreased Lt radial pulse NEURO:CNI, Ox3, No asterixis   . sodium chloride   Intravenous Once  . sodium chloride   Intravenous Once  . amLODipine  10 mg Oral Daily  . cholecalciferol  2,000 Units Oral Daily  . darbepoetin (ARANESP) injection - DIALYSIS  150 mcg Intravenous Q Wed-HD  . gabapentin  300 mg Oral Daily  . levothyroxine  112 mcg Oral QAC breakfast  . losartan  50 mg Oral QHS  . pantoprazole  40 mg Oral BID  . senna-docusate  1 tablet Oral BID  . vitamin B-12  100 mcg Oral Daily  . zinc oxide   Topical BID   No results found. BMET    Component Value Date/Time   NA 130 (L) 11/05/2016 0258   K 3.9 11/05/2016 0258   CL 97 (L) 11/05/2016 0258   CO2 23 11/05/2016 0258   GLUCOSE 101 (H) 11/05/2016 0258   BUN 44 (H) 11/05/2016 0258   CREATININE 3.79 (H) 11/05/2016 0258   CALCIUM 7.4 (L) 11/05/2016 0258   CALCIUM 8.4 (L) 10/20/2016 0929   GFRNONAA 11 (L) 11/05/2016 0258   GFRAA 12 (L) 11/05/2016 0258   CBC    Component Value Date/Time   WBC 10.0 11/05/2016 0258   RBC 2.33 (L) 11/05/2016 0258   HGB 7.4 (L) 11/05/2016 0258   HCT 22.6 (L) 11/05/2016 0258   PLT 186 11/05/2016 0258   MCV 97.0 11/05/2016 0258   MCV 90.5 11/12/2014 1451   MCH 31.8 11/05/2016 0258   MCHC 32.7 11/05/2016 0258   RDW 19.9 (H) 11/05/2016 0258   LYMPHSABS 1.1 10/28/2016 1545   MONOABS 0.4 10/28/2016 1545   EOSABS 0.0 10/28/2016 1545   BASOSABS 0.0 10/28/2016 1545     Assessment: 1. New ESRD   HD tomorrow 2. Sec HPTH  PTH 120 3. Anemia on aranesp.  Fe Sat low 10/20/16.  Will give short course IV iron 4. HTN 5. Intermittent neuropathic pain Lt hand, follow for now  Plan: 1. HD tomorrow 2. Will need to know what SNF she will be going to in order to arrange HD but [facility]   Satsuki Zillmer T

## 2016-11-05 NOTE — Progress Notes (Addendum)
CSW updated dialysis that plan will be for patient to DC to [facility] when stable- [facility].  Patient can DC to SNF when clipped and stable on dialysis schedule  CSW provided additional bed offers to patient in case [facility] does not have bed available  CSW will continue to follow  Burna SisJenna H. Cana Mignano, LCSW Clinical Social Worker [phone]

## 2016-11-06 ENCOUNTER — Encounter (HOSPITAL_COMMUNITY): Payer: Commercial Managed Care - HMO

## 2016-11-06 LAB — RENAL FUNCTION PANEL
ANION GAP: 10 (ref 5–15)
Albumin: 2.4 g/dL — ABNORMAL LOW (ref 3.5–5.0)
BUN: 53 mg/dL — AB (ref 6–20)
CHLORIDE: 97 mmol/L — AB (ref 101–111)
CO2: 24 mmol/L (ref 22–32)
Calcium: 7.8 mg/dL — ABNORMAL LOW (ref 8.9–10.3)
Creatinine, Ser: 4.67 mg/dL — ABNORMAL HIGH (ref 0.44–1.00)
GFR calc Af Amer: 10 mL/min — ABNORMAL LOW (ref >60)
GFR calc non Af Amer: 8 mL/min — ABNORMAL LOW (ref >60)
Glucose, Bld: 108 mg/dL — ABNORMAL HIGH (ref 65–99)
PHOSPHORUS: 5 mg/dL — AB (ref 2.5–4.6)
POTASSIUM: 3.7 mmol/L (ref 3.5–5.1)
Sodium: 131 mmol/L — ABNORMAL LOW (ref 135–145)

## 2016-11-06 LAB — CBC
HCT: 23.3 % — ABNORMAL LOW (ref 36.0–46.0)
Hemoglobin: 7.9 g/dL — ABNORMAL LOW (ref 12.0–15.0)
MCH: 32.1 pg (ref 26.0–34.0)
MCHC: 33.9 g/dL (ref 30.0–36.0)
MCV: 94.7 fL (ref 78.0–100.0)
Platelets: 223 10*3/uL (ref 150–400)
RBC: 2.46 MIL/uL — ABNORMAL LOW (ref 3.87–5.11)
RDW: 19.3 % — ABNORMAL HIGH (ref 11.5–15.5)
WBC: 10.3 10*3/uL (ref 4.0–10.5)

## 2016-11-06 MED ORDER — LORAZEPAM 0.5 MG PO TABS
ORAL_TABLET | ORAL | Status: AC
  Filled 2016-11-06: qty 1

## 2016-11-06 NOTE — Procedures (Signed)
Pt seen on HD.  Ap 150 Vp 200  BFR 300.  CO pain in Rt hand which has been intermittent but significant and suspect related to AVF and small vessel ds of hand.  Will ask VVS to see again.

## 2016-11-06 NOTE — Clinical Social Work Note (Signed)
Clinical Social Worker continuing to follow patient and family for support and discharge planning needs.  [facility]  Per MD, patient needs to be seen by Vascular before medically ready.  Patient will also need to be clipped with chair time available.  CSW has initiated insurance authorization with [employer] due to possibility of weekend discharge.  CSW has updated facility regarding patient plans and will facilitate patient discharge once medically stable.  Macario GoldsJesse Geraldin Habermehl, [place]CS[place] [phone]

## 2016-11-06 NOTE — Progress Notes (Signed)
PT Cancellation Note  Patient Details Name: Jill Johnson MRN: [id number] DOB: May 02, 1941   Cancelled Treatment:    Reason Eval/Treat Not Completed: Patient at procedure or test/unavailable (Pt in hemodialysis.  Will check back as time allows. Thanks.)   Berline LopesDawn F Melrose Kearse 11/06/2016, 8:32 AM Eber Jonesawn Aleese Kamps,PT Acute Rehabilitation [phone] (pager)

## 2016-11-06 NOTE — Progress Notes (Signed)
SATURATION QUALIFICATIONS: (This note is used to comply with regulatory documentation for home oxygen)  Patient Saturations on Room Air at Rest = 95%  Patient Saturations on Room Air while Ambulating = 74-80%  Patient Saturations on 2 Liters of oxygen while Ambulating = 92%  Please briefly explain why patient needs home oxygen: Pt desats without O2 with activity   Gaye PollackRebecca Shonia Skilling, SPT [phone]

## 2016-11-06 NOTE — Progress Notes (Signed)
Pt had sat on the geri chair x 2 hours and 15 minutes.

## 2016-11-06 NOTE — Progress Notes (Signed)
Physical Therapy Treatment Patient Details Name: Jill Perfectancy L Ho MRN: [id number] DOB: Nov 22, 1940 Today's Date: 11/06/2016    History of Present Illness  Jill Johnson is a 75 y.o. female with medical history significant of HTN, HLD, CKD, CAD s/p CABG, AVR, anemia, depression, hypothyroidism; who presents with complaints of shortness of breath due to volume overload.    PT Comments    Pt emotional throughout session today. Pt agreeable to ambulate within room and able to perform sit<>stand x2 from EOBx1 and chair x1. Pt required min A to power to stand. And required seated rest break during ambulation 2/2 fatigue. Pt with higher energy on 2L O2. Pt continues to be a good candidate for SNF to address deficits in mobility and activity tolerance before return to home. PT will continue to follow acutely.   SATURATION QUALIFICATIONS: (This note is used to comply with regulatory documentation for home oxygen)  Patient Saturations on Room Air at Rest = 95%  Patient Saturations on Room Air while Ambulating = 74-80%  Patient Saturations on 2 Liters of oxygen while Ambulating = 92%  Please briefly explain why patient needs home oxygen: Pt desats without O2 with activity    Follow Up Recommendations  SNF     Equipment Recommendations  Other (comment) (Home O2)    Recommendations for Other Services       Precautions / Restrictions Precautions Precautions: Fall Precaution Comments: emotionally labile Restrictions Weight Bearing Restrictions: No    Mobility  Bed Mobility               General bed mobility comments: pt at EOB upon PT arrival  Transfers Overall transfer level: Needs assistance Equipment used: Rolling walker (2 wheeled) Transfers: Sit to/from Stand Sit to Stand: Min assist;+2 safety/equipment         General transfer comment: Min A to power to stand from EOBx1, from chairx1. VCs for hand placement. Pt emotional/in tears as she stood.    Ambulation/Gait Ambulation/Gait assistance: Min assist Ambulation Distance (Feet): 20 Feet (2x10 with seated rest break) Assistive device: Rolling walker (2 wheeled) Gait Pattern/deviations: Step-through pattern;Decreased stride length;Decreased step length - right;Decreased step length - left;Trunk flexed Gait velocity: decreased Gait velocity interpretation: Below normal speed for age/gender General Gait Details: Slow, unsteady gait. Pt required Min A 2/2 possible LOB due to B knee instability. Flexed posture. Assist with steering RW and to stay close to RW for safety.   Stairs            Wheelchair Mobility    Modified Rankin (Stroke Patients Only)       Balance Overall balance assessment: Needs assistance Sitting-balance support: Feet supported;No upper extremity supported Sitting balance-Leahy Scale: Fair     Standing balance support: During functional activity;Bilateral upper extremity supported Standing balance-Leahy Scale: Poor Standing balance comment: Reliant on BUEs for support in standing.                    Cognition Arousal/Alertness: Awake/alert Behavior During Therapy: WFL for tasks assessed/performed Overall Cognitive Status: Within Functional Limits for tasks assessed                 General Comments: Pt emotional during session 2/2 pain and feelings of fatigue    Exercises      General Comments        Pertinent Vitals/Pain Pain Assessment: Faces Faces Pain Scale: Hurts even more Pain Location: L hand (pt notes that it is nerve pain from dialysis) Pain  Descriptors / Indicators: Burning;Aching;Discomfort Pain Intervention(s): Limited activity within patient's tolerance;Monitored during session;Repositioned    Home Living                      Prior Function            PT Goals (current goals can now be found in the care plan section) Acute Rehab PT Goals Patient Stated Goal: rehab then home PT Goal  Formulation: With patient Time For Goal Achievement: 11/17/16 Potential to Achieve Goals: Fair Progress towards PT goals: Progressing toward goals    Frequency    Min 3X/week      PT Plan Current plan remains appropriate    Co-evaluation             End of Session Equipment Utilized During Treatment: Gait belt Activity Tolerance: Patient tolerated treatment well Patient left: in chair;with call bell/phone within reach;with chair alarm set     Time: [id number] PT Time Calculation (min) (ACUTE ONLY): 19 min  Charges:  $Gait Training: 8-22 mins                    G Codes:      Gaye PollackRebecca Harika Laidlaw 11/06/2016, 1:09 PM  Gaye Pollackebecca Andreyah Natividad, SPT [phone]

## 2016-11-06 NOTE — Progress Notes (Signed)
PROGRESS NOTE    Jill Johnson  MWN:[id number] DOB: 21-Apr-1941 DOA: 10/28/2016 PCP: Hollice EspyGATES,DONNA RUTH, MD   Chief Complaint  Patient presents with  . Shortness of Breath    anxiety     Brief Narrative:  HPI on 10/28/2016 by Dr. Madelyn Flavorsondell Smith Jill Johnson is a 75 y.o. female with medical history significant of HTN, HLD, CKD, CAD s/p CABG, AVR, anemia, depression, hypothyroidism; who presents with complaints of shortness of breath. History is obtained by the patient's son as the patient is currently upset and unable to gather herself to provide adequate history. Patient had just recently been seen by her nephrologist Dr. Marisue HumbleSanford yesterday, and was told that her fistula was ready to be used and that it was time to start dialysis. However, the patient wanted to wait until after Christmas. Since that time the patient has had complaints of intermittent chest pain and been very anxious stating that she cannot breathe. She describes the chest pain more so as pressure and tightness. Associated symptoms included increasing swelling in her bilateral lower extremities, more depressed, and easily fatigued. Patient has had no loss of consciousness, focal weakness, changes in vision, abdominal pain, nausea, vomiting, or fever. At this time she still makes urine, but the son is unsure if she took her Lasix this morning.  Assessment & Plan   Acute respiratory distress -Improving daily -Likely secondary to hypervolemia secondary to ESRD vs anemia, with a component of anxiety -Presented with shortness of breath, elevated BNP, CXR showing probable CHF -Nephrology consulted and appreciated -Suspect will improve with HD and diuresis -Weaned off O2, sats on room air in the 90s -Continue nebs PRN, PRN ativan  New ESRD - HD planned for 12/27. Repeat HD 12/29.  Arranging for outpatient HD center.   Symptomatic anemia/Anemia of chronic disease/Possible GI bleed -hemoglobin upon admission 7.6, baseline  8-9 -hemoglobin dropped to 5.4, given 3uPRBCs -Current hemoglobin 7.4 -FOBT + -Gastroenterology consulted and appreciated. Conservative management at this time given patient's current condition -patient has not had a colonoscopy or endoscopy  -Continue PPI -Aspirin and heparin discontinued -Plan for IV iron per nephrology team, follow CBC  Chest pain with elevated troponin -Suspect related to demand ischemia vs anemia and overload -Troponins 0.04 - 0.06 - 0.08 -Cardiology consulted and appreciated, echocardiogram: - Moderate LVH with LVEF 60-65% and grade 2 diastolic dysfunction.   Severe left atrial enlargement. Moderately calcified mitral annulus with thickened leaflets and mild to moderate mitral regurgitation. 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  Trivial aortic regurgitation noted. Gradients have increased compared to the prior study with mean gradient 29 mmHg, although   the dimensionless index does not suggest prosthetic valve stenosis. Trivial tricuspid regurgitation, unable to assess PASP. -Stress test canceled - do as an outpatient  Anxiety/depression -Would benefit from SSRI -Continue Ativan PRN  Coronary artery disease s/p CABG/AVR -Continue aspirin  Essential hypertension -Continue Cozaar and amlodipine  Peripheral neuropathy/toe wounds -Suspect some aspect of peripheral vascular disease due to decreased circulation of lower extremities.  -Continue gabapentin -ABI and/or VVS consult recommended per nephrology   Left hand pain -concerning that this is related to AVF, Renal team asked vascular to re-evaluate 12/29.  Hypothyroidism -Continue levothyroxine  Deconditioning -PT and OT consulted, recommended SNF -Social work consulted  DVT Prophylaxis  SCDs  Code Status: Full  Family Communication: None at bedside  Disposition Plan: Admitted. Patient will dialyze on 11/04/2016 and 12/29. D/c SNF when available. Social Programme researcher, broadcasting/film/videoworker arranging.     Consultants  Cardiology Nephrology Vascular surgery Gastroenterology   Procedures  None  Antibiotics   Anti-infectives    None      Subjective:   [name] seen and examined today.  Pt still having right hand pain.   Objective:   Vitals:   11/06/16 0945 11/06/16 1000 11/06/16 1040 11/06/16 1202  BP: (!) 108/48 (!) 123/34 (!) 135/45 (!) 114/37  Pulse: (!) 56 (!) 56 (!) 55   Resp: 17 16 15    Temp:   98 F (36.7 C)   TempSrc:   Oral Axillary  SpO2:   97% 97%  Weight:   68.8 kg (151 lb 10.8 oz)   Height:        Intake/Output Summary (Last 24 hours) at 11/06/16 1300 Last data filed at 11/06/16 1040  Gross per 24 hour  Intake              240 ml  Output             2500 ml  Net            -2260 ml   Filed Weights   11/06/16 0455 11/06/16 0650 11/06/16 1040  Weight: 71.3 kg (157 lb 3.2 oz) 71.3 kg (157 lb 3 oz) 68.8 kg (151 lb 10.8 oz)    Exam  General: Well developed, well nourished, NAD, appears stated age  HEENT: NCAT, mucous membranes moist.   Cardiovascular: S1 S2 auscultated, 2/6 SEM, RRR  Respiratory: diminished but clear.  Abdomen: Soft, nontender, nondistended, + bowel sounds  Extremities: warm dry without cyanosis clubbing. No LE edema. LUE ecchymosis, AVF +Bruit  Neuro: AAOx3, nonfocal  Psych:Flat  Data Reviewed: I have personally reviewed following labs and imaging studies  CBC:  Recent Labs Lab 11/03/16 0232 11/03/16 2043 11/04/16 0536 11/05/16 0258 11/06/16 0704  WBC 8.9 8.6 9.3 10.0 10.3  HGB 7.4* 7.5* 7.5* 7.4* 7.9*  HCT 22.3* 22.2* 22.0* 22.6* 23.3*  MCV 94.1 96.5 93.6 97.0 94.7  PLT 206 193 216 186 223   Basic Metabolic Panel:  Recent Labs Lab 11/03/16 0232 11/03/16 2043 11/04/16 0536 11/05/16 0258 11/06/16 0704  NA 132* 131* 131* 130* 131*  K 3.7 3.9 3.7 3.9 3.7  CL 95* 95* 96* 97* 97*  CO2 26 24 22 23 24   GLUCOSE 110* 122* 115* 101* 108*  BUN 65* 72* 74* 44* 53*  CREATININE 5.24* 5.33* 5.51* 3.79* 4.67*   CALCIUM 7.7* 7.4* 7.6* 7.4* 7.8*  PHOS  --  7.1*  --  5.0* 5.0*   GFR: Estimated Creatinine Clearance: 8.7 mL/min (by C-G formula based on SCr of 4.67 mg/dL (H)). Liver Function Tests:  Recent Labs Lab 11/03/16 2043 11/05/16 0258 11/06/16 0704  ALBUMIN 2.3* 2.2* 2.4*   No results for input(s): LIPASE, AMYLASE in the last 168 hours. No results for input(s): AMMONIA in the last 168 hours. Coagulation Profile: No results for input(s): INR, PROTIME in the last 168 hours. Cardiac Enzymes: No results for input(s): CKTOTAL, CKMB, CKMBINDEX, TROPONINI in the last 168 hours. BNP (last 3 results) No results for input(s): PROBNP in the last 8760 hours. HbA1C: No results for input(s): HGBA1C in the last 72 hours. CBG: No results for input(s): GLUCAP in the last 168 hours. Lipid Profile: No results for input(s): CHOL, HDL, LDLCALC, TRIG, CHOLHDL, LDLDIRECT in the last 72 hours. Thyroid Function Tests: No results for input(s): TSH, T4TOTAL, FREET4, T3FREE, THYROIDAB in the last 72 hours. Anemia Panel: No results for input(s): VITAMINB12, FOLATE, FERRITIN, TIBC,  IRON, RETICCTPCT in the last 72 hours. Urine analysis:    Component Value Date/Time   COLORURINE YELLOW 10/28/2016 1600   APPEARANCEUR CLEAR 10/28/2016 1600   LABSPEC 1.011 10/28/2016 1600   PHURINE 6.5 10/28/2016 1600   GLUCOSEU NEGATIVE 10/28/2016 1600   HGBUR NEGATIVE 10/28/2016 1600   BILIRUBINUR NEGATIVE 10/28/2016 1600   KETONESUR NEGATIVE 10/28/2016 1600   PROTEINUR 30 (A) 10/28/2016 1600   UROBILINOGEN 1.0 01/30/2010 1104   NITRITE NEGATIVE 10/28/2016 1600   LEUKOCYTESUR NEGATIVE 10/28/2016 1600    Recent Results (from the past 240 hour(s))  Urine culture     Status: None   Collection Time: 10/28/16  4:00 PM  Result Value Ref Range Status   Specimen Description URINE, CATHETERIZED  Final   Special Requests NONE  Final   Culture [facility] GROWTH [facility]   Final   Report Status 10/29/2016  FINAL  Final  MRSA PCR Screening     Status: None   Collection Time: 10/28/16  8:57 PM  Result Value Ref Range Status   MRSA by PCR NEGATIVE NEGATIVE Final    Comment:        The GeneXpert MRSA Assay (FDA approved for NASAL specimens only), is one component of a comprehensive MRSA colonization surveillance program. It is not intended to diagnose MRSA infection nor to guide or monitor treatment for MRSA infections.     Radiology Studies: No results found.  Scheduled Meds: . sodium chloride   Intravenous Once  . sodium chloride   Intravenous Once  . amLODipine  10 mg Oral Daily  . cholecalciferol  2,000 Units Oral Daily  . darbepoetin (ARANESP) injection - DIALYSIS  150 mcg Intravenous Q Wed-HD  . gabapentin  300 mg Oral Daily  . levothyroxine  112 mcg Oral QAC breakfast  . LORazepam      . losartan  50 mg Oral QHS  . pantoprazole  40 mg Oral BID  . senna-docusate  1 tablet Oral BID  . vitamin B-12  100 mcg Oral Daily  . zinc oxide   Topical BID   Continuous Infusions:   LOS: 9 days   Time Spent in minutes   30 minutes  Clanford Johnson MD on 11/06/2016 at 1:00 PM  Between 7am to 7pm - Pager - [phone]  After 7pm go to [email or web address] - password [email or web address]  And look for the night coverage person covering for me after hours  [facility] Office  [phone]

## 2016-11-06 NOTE — Progress Notes (Signed)
[facility] [place]  Patient name: Jill Johnson MRN: [id number] DOB: 1941/02/17 Sex: female  REASON FOR CONSULT: pain left hand with dialysis, consult is from Dr. Briant CedarMattingly  HPI: Jill Johnson is a 75 y.o. female who is s/p left brachial-cephalic AV (Date: 08/07/2016) by Dr. Arbie CookeyEarly who presents with left hand pain and numbness during dialysis today. She started on dialysis 10/30/16. She did not complain of pain in her left hand at her two previous dialysis sessions but did have pain afterwards. She states that she was given a "pill" today that made her pain better. She denies any pain or numbness right now.   Her left arm is bruised because her fistula was infiltrated when the patient suddenly woke up during dialysis one session and panicked.   Past Medical History:  Diagnosis Date  . Anemia   . CAD (coronary artery disease)   . CFS (chronic fatigue syndrome)   . Chronic rhinitis   . Complication of anesthesia    "a little hard waking up"  . Constipation    due to iron  . Coronary atherosclerosis   . Depression   . Difficult intubation    "I have a small opening" When she had her CABG   . DJD (degenerative joint disease)   . Hyperlipidemia   . Hypertension   . Hypothyroidism   . Kidney stone   . MDD (major depressive disorder)   . Neuropathy (HCC)    in legs  . Obesity   . Osteopenia   . Renal insufficiency    Post Op  . Stroke [facility](HCC)    TIA, several    Family History  Problem Relation Age of Onset  . Alzheimer's disease Mother   . Aortic stenosis Father   . Heart disease Father   . Heart disease Son     SOCIAL HISTORY: Social History  Substance Use Topics  . Smoking status: Never Smoker  . Smokeless tobacco: Never Used  . Alcohol use No    Allergies  Allergen Reactions  . Prednisone Other (See Comments)    Makes her loony    Current Facility-Administered Medications  Medication Dose Route Frequency Provider Last Rate Last Dose    . 0.9 %  sodium chloride infusion   Intravenous Once Elvis CoilMartin Webb, MD      . 0.9 %  sodium chloride infusion   Intravenous Once Elvis CoilMartin Webb, MD      . acetaminophen (TYLENOL) tablet 650 mg  650 mg Oral Q6H PRN Clydie Braunondell A Smith, MD   650 mg at 11/06/16 0530   Or  . acetaminophen (TYLENOL) suppository 650 mg  650 mg Rectal Q6H PRN Clydie Braunondell A Smith, MD      . albuterol (PROVENTIL) (2.5 MG/3ML) 0.083% nebulizer solution 2.5 mg  2.5 mg Nebulization Q2H PRN Clydie Braunondell A Smith, MD      . amLODipine (NORVASC) tablet 10 mg  10 mg Oral Daily Clydie Braunondell A Smith, MD   10 mg at 11/06/16 1141  . cholecalciferol (VITAMIN D) tablet 2,000 Units  2,000 Units Oral Daily Clydie Braunondell A Smith, MD   2,000 Units at 11/06/16 1141  . Darbepoetin Alfa (ARANESP) injection 150 mcg  150 mcg Intravenous Q Wed-HD Elvis CoilMartin Webb, MD   150 mcg at 11/04/16 0950  . gabapentin (NEURONTIN) capsule 300 mg  300 mg Oral Daily Clydie Braunondell A Smith, MD   300 mg at 11/06/16 1140  . guaiFENesin-dextromethorphan (ROBITUSSIN DM) 100-10 MG/5ML syrup 5 mL  5  mL Oral Q4H PRN Clydie Braunondell A Smith, MD   5 mL at 10/29/16 0035  . levothyroxine (SYNTHROID, LEVOTHROID) tablet 112 mcg  112 mcg Oral QAC breakfast Clydie Braunondell A Smith, MD   112 mcg at 11/05/16 [phone]  . LORazepam (ATIVAN) 0.5 MG tablet           . LORazepam (ATIVAN) tablet 0.5 mg  0.5 mg Oral Q6H PRN Maryann Mikhail, DO   0.5 mg at 11/06/16 0931  . losartan (COZAAR) tablet 50 mg  50 mg Oral QHS Clydie Braunondell A Smith, MD   50 mg at 11/05/16 2125  . morphine 2 MG/ML injection 2 mg  2 mg Intravenous Q4H PRN Maryann Mikhail, DO   2 mg at 11/01/16 1319  . ondansetron (ZOFRAN) injection 4 mg  4 mg Intravenous Q6H PRN Leda GauzeKaren J Kirby-Graham, NP   4 mg at 10/30/16 1342  . pantoprazole (PROTONIX) EC tablet 40 mg  40 mg Oral BID Maryann Mikhail, DO   40 mg at 11/06/16 1141  . senna-docusate (Senokot-S) tablet 1 tablet  1 tablet Oral BID Clanford Cyndie MullL Johnson, MD   1 tablet at 11/06/16 1141  . vitamin B-12 (CYANOCOBALAMIN) tablet 100 mcg   100 mcg Oral Daily Clydie Braunondell A Smith, MD   100 mcg at 11/06/16 1140  . zinc oxide (BALMEX) 11.3 % cream   Topical BID Courteney Lyn Mackuen, MD       PHYSICAL EXAM: Vitals:   11/06/16 0945 11/06/16 1000 11/06/16 1040 11/06/16 1202  BP: (!) 108/48 (!) 123/34 (!) 135/45 (!) 114/37  Pulse: (!) 56 (!) 56 (!) 55   Resp: 17 16 15    Temp:   98 F (36.7 C)   TempSrc:   Oral Axillary  SpO2:   97% 97%  Weight:   151 lb 10.8 oz (68.8 kg)   Height:        GENERAL: The patient is a well-nourished female, in no acute distress. The vital signs are documented above. VASCULAR: left upper arm with palpable thrill. Ecchymosis to left upper arm. Nonpalpable left radial pulse, but left hand is warm with sensory and motor function intact. Denies pain to palpation.   MEDICAL ISSUES: Possible steal syndrome   The patient had an episode of severe pain during today's dialysis session. She denies pain during her two previous dialysis session. She currently denies any pain. She currently is able to tolerate her symptoms. Discussed ligation of fistula with need for insertion of tunneled dialysis catheter and new access if her symptoms become intolerable. The patient states that she wants to see how her next dialysis session goes. Will follow.   Maris BergerKimberly Trinh, PA-C [facility] [place]   I agree with the above, I have seen and examined the patient.  She likely has a steal in her left hand, but wants to attempt dialysis again today.  I discussed 3 options.  #1:  Continue dealing with her sx's, #2  Fistula ligation and placement of a TDC, and #3 banding of the fistula.  We will see how she does with HD today before making a decision.   Durene CalWells Brabham

## 2016-11-07 LAB — RENAL FUNCTION PANEL
ALBUMIN: 2.2 g/dL — AB (ref 3.5–5.0)
Anion gap: 8 (ref 5–15)
BUN: 22 mg/dL — AB (ref 6–20)
CALCIUM: 7.8 mg/dL — AB (ref 8.9–10.3)
CHLORIDE: 98 mmol/L — AB (ref 101–111)
CO2: 26 mmol/L (ref 22–32)
CREATININE: 2.95 mg/dL — AB (ref 0.44–1.00)
GFR calc Af Amer: 17 mL/min — ABNORMAL LOW (ref >60)
GFR calc non Af Amer: 14 mL/min — ABNORMAL LOW (ref >60)
Glucose, Bld: 101 mg/dL — ABNORMAL HIGH (ref 65–99)
Phosphorus: 3.1 mg/dL (ref 2.5–4.6)
Potassium: 3.9 mmol/L (ref 3.5–5.1)
SODIUM: 132 mmol/L — AB (ref 135–145)

## 2016-11-07 LAB — CBC
HCT: 24.2 % — ABNORMAL LOW (ref 36.0–46.0)
Hemoglobin: 7.9 g/dL — ABNORMAL LOW (ref 12.0–15.0)
MCH: 31.3 pg (ref 26.0–34.0)
MCHC: 32.6 g/dL (ref 30.0–36.0)
MCV: 96 fL (ref 78.0–100.0)
PLATELETS: 229 10*3/uL (ref 150–400)
RBC: 2.52 MIL/uL — ABNORMAL LOW (ref 3.87–5.11)
RDW: 19.2 % — AB (ref 11.5–15.5)
WBC: 10.5 10*3/uL (ref 4.0–10.5)

## 2016-11-07 MED ORDER — ACETAMINOPHEN 325 MG PO TABS: 650.0000 mg | ORAL_TABLET | Freq: Four times a day (QID) | ORAL | 0 refills | Status: AC | PRN

## 2016-11-07 MED ORDER — LOSARTAN POTASSIUM 50 MG PO TABS: 50.0000 mg | ORAL_TABLET | Freq: Every day | ORAL | 0 refills | Status: AC

## 2016-11-07 MED ORDER — RENA-VITE PO TABS: 1.0000 | ORAL_TABLET | Freq: Every day | ORAL | Status: DC

## 2016-11-07 MED ORDER — ASPIRIN 81 MG PO TABS: 81.0000 mg | ORAL_TABLET | Freq: Every day | ORAL | 0 refills | Status: AC

## 2016-11-07 MED ORDER — DARBEPOETIN ALFA 150 MCG/0.3ML IJ SOSY
150.0000 ug | PREFILLED_SYRINGE | INTRAMUSCULAR | Status: AC
Start: 2016-11-11 — End: ?

## 2016-11-07 MED ORDER — SENNOSIDES-DOCUSATE SODIUM 8.6-50 MG PO TABS: 1.0000 | ORAL_TABLET | Freq: Every evening | ORAL | 0 refills | Status: AC | PRN

## 2016-11-07 NOTE — Discharge Summary (Signed)
Physician Discharge Summary  Jill Johnson  RUE:[id number]  DOB: 04/23/1941  DOA: 10/28/2016 PCDorthula Perfect: Hollice EspyGATES,DONNA RUTH, MD  Admit date: 10/28/2016 Discharge date: 11/07/2016  Admitted From: Home  Disposition:  SNF [facility]   Recommendations for Outpatient Follow-up:  1. Follow up with PCP in 1-2 weeks 2. Please obtain BMP/CBC in one week  Discharge Condition: Improved  CODE STATUS: FULL Diet recommendation: Renal/Heart healthy   Brief/Interim Summary: Jill Huhancy L Peedenis a 75 y.o.femalewith medical history significant of HTN, HLD, CKD, CAD s/p CABG, AVR, anemia, depression, hypothyroidism;who presented with complaints of shortness of breath.  Patient had just recently been seen by her nephrologist Dr. Allene PyoSanfordand was told that her fistula was ready to be used and that it was time to start dialysis. However, the patient wanted to wait until after Christmas. Since that time the patient has had complaints of intermittent chest pain and been very anxious stating that she cannot breathe. She describes the chest pain more so as pressure and tightness. Associated symptoms included increasing swelling in her bilateral lower extremities, more depressed, and easily fatigued. Patient was admitted for acute respiratory distress due to hypervolemia. Also was found to be anemic requiring transfusion. Nephrology was consulted and decision was made to start HD patient has been tolerating dialysis well. Her acute symptoms has resolved.   Subjective: Patient seen and examined at bedside. No new complaints. Will be discharge to SNF today for PT   Discharge Diagnoses/Hospital Course:  Acute respiratory distress - Resolved, breathing room air.  Likely secondary to hypervolemia secondary to ESRD vs anemia, with a component of anxiety Presented with shortness of breath, elevated BNP, CXR showing probable CHF Patient improved with HD and diuresis Weaned off O2, sats on room air in the high 90s Continue nebs  PRN, PRN ativan   New ESRD - received total of 6 HD during hospital stay - set up to get HD TTS   Symptomatic anemia/Anemia of chronic disease/Subacute GI bleed (melena)  hemoglobin upon admission 7.6, baseline 8-9 hemoglobin dropped to 5.4, given 3uPRBCs Current hemoglobin 7.9 FOBT + Gastroenterology was consulted and conservative management  Was recommended at this time given patient's current condition patient has not had a colonoscopy or endoscopy  Continue PPI IV iron given per renal team  Monitor CBC in 1 week   Chest pain with elevated troponin Suspect related to demand ischemia vs anemia and overload Troponins 0.04 - 0.06 - 0.08 Cardiology consulted and appreciated, echocardiogram: - Moderate LVH with LVEF 60-65% and grade 2 diastolic dysfunction. Severe left atrial enlargement. Moderately calcified mitral annulus with thickened leaflets and mild to moderate mitral regurgitation. 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  Trivial aortic regurgitation noted. Cardiology recommended stress test as outpatient - when renally stable with HD   Anxiety/depression - no SI/HI  Continue Ativan PRN If worsen may add SSRI  Coronary artery disease s/pCABG/AVR Continue aspirin for secondary prevention - will decrease dose to 81 mg given possible GI bleed   Essential hypertension - stable  Continue Cozaar and amlodipine  Peripheral neuropathy/toe wounds Suspect some aspect of peripheral vascular disease due to decreased circulation of lower extremities.  Continue gabapentin Obtain ABI in the outpatient setting   Hypothyroidism Continue levothyroxine  Deconditioning - due to acute on chronic medical conditions  [facility]    Discharge Instructions  Discharge Instructions    Call MD for:  difficulty breathing, headache or visual disturbances    Complete by:  As directed  Call MD for:  extreme fatigue    Complete by:  As directed    Call MD for:   hives    Complete by:  As directed    Call MD for:  persistant dizziness or light-headedness    Complete by:  As directed    Call MD for:  persistant nausea and vomiting    Complete by:  As directed    Call MD for:  redness, tenderness, or signs of infection (pain, swelling, redness, odor or green/yellow discharge around incision site)    Complete by:  As directed    Call MD for:  severe uncontrolled pain    Complete by:  As directed    Call MD for:  temperature >100.4    Complete by:  As directed    Diet - low sodium heart healthy    Complete by:  As directed    Discharge instructions    Complete by:  As directed    Increase activity slowly    Complete by:  As directed      Allergies as of 11/07/2016      Reactions   Prednisone Other (See Comments)   Makes her loony      Medication List    STOP taking these medications   acetaminophen 650 MG CR tablet Commonly known as:  TYLENOL Replaced by:  acetaminophen 325 MG tablet   MAGNESIA PO     TAKE these medications   acetaminophen 325 MG tablet Commonly known as:  TYLENOL Take 2 tablets (650 mg total) by mouth every 6 (six) hours as needed for mild pain (or Fever >/= 101). Replaces:  acetaminophen 650 MG CR tablet   amLODipine 10 MG tablet Commonly known as:  NORVASC Take 1 tablet (10 mg total) by mouth daily.   aspirin 81 MG tablet Take 1 tablet (81 mg total) by mouth daily. What changed:  medication strength  how much to take   cholecalciferol 1000 units tablet Commonly known as:  VITAMIN D Take 2,000 Units by mouth daily.   CORICIDIN HBP CONGESTION/COUGH 10-200 MG Caps Generic drug:  Dextromethorphan-Guaifenesin Take 1 capsule by mouth daily as needed (cold).   Darbepoetin Alfa 150 MCG/0.3ML Sosy injection Commonly known as:  ARANESP Inject 0.3 mLs (150 mcg total) into the vein every Wednesday with hemodialysis. Start taking on:  11/11/2016   fenofibrate 160 MG tablet Take 160 mg by mouth daily.    furosemide 40 MG tablet Commonly known as:  LASIX Take 160 mg by mouth daily.   gabapentin 300 MG capsule Commonly known as:  NEURONTIN Take 300 mg by mouth daily.   levothyroxine 112 MCG tablet Commonly known as:  SYNTHROID, LEVOTHROID Take 112 mcg by mouth daily before breakfast.   losartan 50 MG tablet Commonly known as:  COZAAR Take 1 tablet (50 mg total) by mouth at bedtime. What changed:  medication strength   VITAMIN B-12 PO Take 1 tablet by mouth daily.       Allergies  Allergen Reactions  . Prednisone Other (See Comments)    Makes her loony    Consultations:  Nephrology - Dr. Briant CedarMattingly   Cardiology   Vascular Surgery   [facility]    Procedures/Studies: Dg Chest 2 View  Result Date: 10/28/2016 CLINICAL DATA:  Chest pain, anxiety, history coronary disease post CABG, hypertension, stroke, acute diastolic heart failure, prior AVR EXAM: CHEST  2 VIEW COMPARISON:  11/13/2015 FINDINGS: Enlargement of cardiac silhouette post CABG and AVR. Pulmonary  vascular congestion. BILATERAL pulmonary infiltrates favoring pulmonary edema and CHF. Atherosclerotic calcification aorta. Rotation to the LEFT on the AP view. No gross pleural effusion or pneumothorax. Bones demineralized. IMPRESSION: Probable CHF. Post CABG and AVR. Aortic atherosclerosis. Electronically Signed   By: Ulyses Southward M.D.   On: 10/28/2016 16:40   ECHO 12/23  ------------------------------------------------------------------- Study Conclusions  - Left ventricle: The cavity size was normal. Wall thickness was   increased in a pattern of moderate LVH. Systolic function was   normal. The estimated ejection fraction was in the range of 60%   to 65%. Wall motion was normal; there were no regional wall   motion abnormalities. Features are consistent with a pseudonormal   left ventricular filling pattern, with concomitant abnormal   relaxation and increased filling pressure (grade 2  diastolic   dysfunction). - Aortic valve: Bioprosthesis in aortic position. There was trivial   regurgitation. Mean gradient (S): 29 mm Hg. Peak gradient (S): 51   mm Hg. VTI ratio of LVOT to aortic valve: 0.4. - Mitral valve: Calcified annulus. Mildly thickened leaflets .   There was mild to moderate regurgitation. - Left atrium: The atrium was severely dilated. - Tricuspid valve: There was trivial regurgitation. - Pulmonary arteries: Systolic pressure could not be accurately   estimated. - Pericardium, extracardiac: There was no pericardial effusion.  Impressions:  - Moderate LVH with LVEF 60-65% and grade 2 diastolic dysfunction.   Severe left atrial enlargement. Moderately calcified mitral   annulus with thickened leaflets and mild to moderate mitral   regurgitation. 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   Trivial aortic regurgitation noted. Gradients have increased   compared to the prior study with mean gradient 29 mmHg, although   the dimensionless index does not suggest prosthetic valve   stenosis. Trivial tricuspid regurgitation, unable to assess PASP.    Discharge Exam: Vitals:   11/07/16 1330 11/07/16 1400  BP: (!) 115/50 (!) 145/51  Pulse: 61 60  Resp: 15 15  Temp:  98 F (36.7 C)   Vitals:   11/07/16 1230 11/07/16 1300 11/07/16 1330 11/07/16 1400  BP: (!) 119/46 (!) 113/39 (!) 115/50 (!) 145/51  Pulse: 60 (!) 58 61 60  Resp: 15 15 15 15   Temp:    98 F (36.7 C)  TempSrc:    Oral  SpO2:    97%  Weight:    68.6 kg (151 lb 3.8 oz)  Height:        General: Pt is alert, awake, not in acute distress Cardiovascular: RRR, S1/S2 + Respiratory: CTA bilaterally Abdominal: Soft, NT, ND Extremities: Multiple bruises in the upper extremities    The results of significant diagnostics from this hospitalization (including imaging, microbiology, ancillary and laboratory) are listed below for reference.     Microbiology: Recent Results (from the past 240  hour(s))  Urine culture     Status: None   Collection Time: 10/28/16  4:00 PM  Result Value Ref Range Status   Specimen Description URINE, CATHETERIZED  Final   Special Requests NONE  Final   Culture NO GROWTH Performed at [facility]   Final   Report Status 10/29/2016 FINAL  Final  MRSA PCR Screening     Status: None   Collection Time: 10/28/16  8:57 PM  Result Value Ref Range Status   MRSA by PCR NEGATIVE NEGATIVE Final    Comment:        The GeneXpert MRSA Assay (FDA approved for NASAL specimens only), is  one component of a comprehensive MRSA colonization surveillance program. It is not intended to diagnose MRSA infection nor to guide or monitor treatment for MRSA infections.      Labs: BNP (last 3 results)  Recent Labs  10/28/16 1545  BNP 1,384.6*   Basic Metabolic Panel:  Recent Labs Lab 11/03/16 2043 11/04/16 0536 11/05/16 0258 11/06/16 0704 11/07/16 0526  NA 131* 131* 130* 131* 132*  K 3.9 3.7 3.9 3.7 3.9  CL 95* 96* 97* 97* 98*  CO2 24 22 23 24 26   GLUCOSE 122* 115* 101* 108* 101*  BUN 72* 74* 44* 53* 22*  CREATININE 5.33* 5.51* 3.79* 4.67* 2.95*  CALCIUM 7.4* 7.6* 7.4* 7.8* 7.8*  PHOS 7.1*  --  5.0* 5.0* 3.1   Liver Function Tests:  Recent Labs Lab 11/03/16 2043 11/05/16 0258 11/06/16 0704 11/07/16 0526  ALBUMIN 2.3* 2.2* 2.4* 2.2*   No results for input(s): LIPASE, AMYLASE in the last 168 hours. No results for input(s): AMMONIA in the last 168 hours. CBC:  Recent Labs Lab 11/03/16 2043 11/04/16 0536 11/05/16 0258 11/06/16 0704 11/07/16 0527  WBC 8.6 9.3 10.0 10.3 10.5  HGB 7.5* 7.5* 7.4* 7.9* 7.9*  HCT 22.2* 22.0* 22.6* 23.3* 24.2*  MCV 96.5 93.6 97.0 94.7 96.0  PLT 193 216 186 223 229   Cardiac Enzymes: No results for input(s): CKTOTAL, CKMB, CKMBINDEX, TROPONINI in the last 168 hours. BNP: Invalid input(s): POCBNP CBG: No results for input(s): GLUCAP in the last 168 hours. D-Dimer No results for input(s):  DDIMER in the last 72 hours. Hgb A1c No results for input(s): HGBA1C in the last 72 hours. Lipid Profile No results for input(s): CHOL, HDL, LDLCALC, TRIG, CHOLHDL, LDLDIRECT in the last 72 hours. Thyroid function studies No results for input(s): TSH, T4TOTAL, T3FREE, THYROIDAB in the last 72 hours.  Invalid input(s): FREET3 Anemia work up No results for input(s): VITAMINB12, FOLATE, FERRITIN, TIBC, IRON, RETICCTPCT in the last 72 hours. Urinalysis    Component Value Date/Time   COLORURINE YELLOW 10/28/2016 1600   APPEARANCEUR CLEAR 10/28/2016 1600   LABSPEC 1.011 10/28/2016 1600   PHURINE 6.5 10/28/2016 1600   GLUCOSEU NEGATIVE 10/28/2016 1600   HGBUR NEGATIVE 10/28/2016 1600   BILIRUBINUR NEGATIVE 10/28/2016 1600   KETONESUR NEGATIVE 10/28/2016 1600   PROTEINUR 30 (A) 10/28/2016 1600   UROBILINOGEN 1.0 01/30/2010 1104   NITRITE NEGATIVE 10/28/2016 1600   LEUKOCYTESUR NEGATIVE 10/28/2016 1600   Sepsis Labs Invalid input(s): PROCALCITONIN,  WBC,  LACTICIDVEN Microbiology Recent Results (from the past 240 hour(s))  Urine culture     Status: None   Collection Time: 10/28/16  4:00 PM  Result Value Ref Range Status   Specimen Description URINE, CATHETERIZED  Final   Special Requests NONE  Final   Culture NO GROWTH Performed at [facility]   Final   Report Status 10/29/2016 FINAL  Final  MRSA PCR Screening     Status: None   Collection Time: 10/28/16  8:57 PM  Result Value Ref Range Status   MRSA by PCR NEGATIVE NEGATIVE Final    Comment:        The GeneXpert MRSA Assay (FDA approved for NASAL specimens only), is one component of a comprehensive MRSA colonization surveillance program. It is not intended to diagnose MRSA infection nor to guide or monitor treatment for MRSA infections.      Time coordinating discharge: Over 30 minutes  SIGNED:  Latrelle Dodrill, MD  [facility] 11/07/2016, 2:54 PM Pager  If 7PM-7AM, please contact  night-coverage [email or web address] Password [email or web address]

## 2016-11-07 NOTE — Progress Notes (Signed)
S: No hand pain this am.  [facility] O:BP (!) 152/48 (BP Location: Right Arm)   Pulse 66   Temp 98.7 F (37.1 C) (Oral)   Resp 16   Ht 5' (1.524 m)   Wt 69.4 kg (153 lb 1.6 oz)   SpO2 98%   BMI 29.90 kg/m   Intake/Output Summary (Last 24 hours) at 11/07/16 0716 Last data filed at 11/06/16 1939  Gross per 24 hour  Intake                0 ml  Output             2501 ml  Net            -2501 ml   Weight change: -2.506 kg (-5 lb 8.4 oz) ZOX:WRUEAGen:awake and alert CVS:RRR 3/6systolic M Resp:Few basilar crackles Abd:+ BS NTND Ext:No edema, mild ischemic changes of feet.  LUA AVF + bruit, ecchymosis.  NEURO:CNI, Ox3, No asterixis   . sodium chloride   Intravenous Once  . sodium chloride   Intravenous Once  . amLODipine  10 mg Oral Daily  . cholecalciferol  2,000 Units Oral Daily  . darbepoetin (ARANESP) injection - DIALYSIS  150 mcg Intravenous Q Wed-HD  . gabapentin  300 mg Oral Daily  . levothyroxine  112 mcg Oral QAC breakfast  . losartan  50 mg Oral QHS  . multivitamin  1 tablet Oral QHS  . pantoprazole  40 mg Oral BID  . senna-docusate  1 tablet Oral BID  . vitamin B-12  100 mcg Oral Daily  . zinc oxide   Topical BID   No results found. BMET    Component Value Date/Time   NA 132 (L) 11/07/2016 0526   K 3.9 11/07/2016 0526   CL 98 (L) 11/07/2016 0526   CO2 26 11/07/2016 0526   GLUCOSE 101 (H) 11/07/2016 0526   BUN 22 (H) 11/07/2016 0526   CREATININE 2.95 (H) 11/07/2016 0526   CALCIUM 7.8 (L) 11/07/2016 0526   CALCIUM 8.4 (L) 10/20/2016 0929   GFRNONAA 14 (L) 11/07/2016 0526   GFRAA 17 (L) 11/07/2016 0526   CBC    Component Value Date/Time   WBC 10.5 11/07/2016 0527   RBC 2.52 (L) 11/07/2016 0527   HGB 7.9 (L) 11/07/2016 0527   HCT 24.2 (L) 11/07/2016 0527   PLT 229 11/07/2016 0527   MCV 96.0 11/07/2016 0527   MCV 90.5 11/12/2014 1451   MCH 31.3 11/07/2016 0527   MCHC 32.6 11/07/2016 0527   RDW 19.2 (H) 11/07/2016 0527   LYMPHSABS 1.1 10/28/2016 1545   MONOABS 0.4 10/28/2016 1545   EOSABS 0.0 10/28/2016 1545   BASOSABS 0.0 10/28/2016 1545     Assessment: 1. New ESRD   2. Sec HPTH  PTH 120 3. Anemia on aranesp.  Fe Sat low 10/20/16.  Will give short course IV iron 4. HTN 5. Intermittent neuropathic pain Lt hand, Seen by [facility].  Pt wants to to see how things go with further HD  Plan: 1. Need to get outpt HD unit arranged now that we know what NH she is going to.  Spoke with Dx secretary Darel HongJudy who will call [facility] about a spot.  If she will be TTS as outpt then would need HD today. Keiara Sneeringer T

## 2016-11-07 NOTE — Progress Notes (Signed)
Pt denied any pain at fistula site after dialysis. Dr. Myra GianottiBrabham made aware and stated it is ok to dc.

## 2016-11-07 NOTE — Clinical Social Work Placement (Signed)
   CLINICAL SOCIAL WORK PLACEMENT  NOTE  Date:  11/07/2016  Patient Details  Name: Jill Johnson MRN: [id number] Date of Birth: 1941-05-01  Clinical Social Work is seeking post-discharge placement for this patient at the Skilled  Nursing Facility level of care (*CSW will initial, date and re-position this form in  chart as items are completed):  Yes   Patient/family provided with [facility] Clinical Social Work Department's list of facilities offering this level of care within the geographic area requested by the patient (or if unable, by the patient's family).  Yes   Patient/family informed of their freedom to choose among providers that offer the needed level of care, that participate in Medicare, Medicaid or managed care program needed by the patient, have an available bed and are willing to accept the patient.  Yes   Patient/family informed of [place]'s ownership interest in [facility] and [facility], as well as of the fact that they are under no obligation to receive care at these facilities.  PASRR submitted to EDS on 10/30/16     PASRR number received on 10/30/16     Existing PASRR number confirmed on       FL2 transmitted to all facilities in geographic area requested by pt/family on 11/03/16     FL2 transmitted to all facilities within larger geographic area on       Patient informed that his/her managed care company has contracts with or will negotiate with certain facilities, including the following:        Yes   Patient/family informed of bed offers received.  Patient chooses bed at  [facility]     Physician recommends and patient chooses bed at      Patient to be transferred to  ([facility]) on 11/07/16.  Patient to be transferred to facility by  [facility])     Patient family notified on 11/07/16 of transfer.  Name of family member notified:  Son [phone]     PHYSICIAN       Additional Comment:     _______________________________________________ Norlene DuelBROWN, Fahad Cisse B, LCSWA 11/07/2016, 11:34 AM

## 2016-11-07 NOTE — Progress Notes (Signed)
11/07/2016 7:27 AM Hemodialysis Outpatient Note; this patient has been accepted [facility] on a Tuesday, Thursday and Saturday 2nd shift schedule,per the dialysis center they have a waiting list for the MWF schedule. Thank you. Cleotis NipperJudith Zorah Backes.

## 2016-11-07 NOTE — Discharge Planning (Addendum)
CSW notified by CN, pt will be ready to DC after HD today. CSW contacted facility, [facility], spoke to [place]Janie who reported to her knowledge pt was on a Mon/Wed/Fri HD Schedule and the facility would not be able to transport pt on Mon due to the Holiday. CSW advised Wille CelesteJanie pt in HD now and would not likely need HD again until Tues. CSW called CN who confirmed that pt is on a TTS HD schedule per renal. CSW called Janie back to advise, ok for pt to come today. Pt in HD until 2PM. Once DC Summary is in system CSW will send to facility and coordinate transport. CSW will remain available for any pt needs/support as needed.  Page to MD and call to pt's nurse to get DC Summary for facility so that pt can DC today.202PM  DC Summary/Trnsfr Rpt sent to facility. Facility reported that due to holiday they cannot transport pt to HD on Tues, CSW informed family, family will transport pt to HD on Tuesday. [facility] transport set, family updated.  Nurse please call [phone] to give report. Pt will go to room 214.  Sabre Leonetti B. Gean QuintBrown,MSW, LCSWA Clinical Social Work Dept Weekend Social Worker [phone] 11:21 AM

## 2016-11-07 NOTE — Progress Notes (Signed)
Attempted report to [facility] 

## 2016-11-09 DIAGNOSIS — L0390 Cellulitis, unspecified: Secondary | ICD-10-CM | POA: Diagnosis not present

## 2016-11-09 DIAGNOSIS — Z992 Dependence on renal dialysis: Secondary | ICD-10-CM | POA: Diagnosis not present

## 2016-11-09 DIAGNOSIS — N186 End stage renal disease: Secondary | ICD-10-CM | POA: Diagnosis not present

## 2016-11-09 DIAGNOSIS — I10 Essential (primary) hypertension: Secondary | ICD-10-CM | POA: Diagnosis not present

## 2016-11-09 DIAGNOSIS — I739 Peripheral vascular disease, unspecified: Secondary | ICD-10-CM | POA: Diagnosis not present

## 2016-11-09 DIAGNOSIS — N2581 Secondary hyperparathyroidism of renal origin: Secondary | ICD-10-CM | POA: Diagnosis not present

## 2016-11-09 DIAGNOSIS — M6281 Muscle weakness (generalized): Secondary | ICD-10-CM | POA: Diagnosis not present

## 2016-11-09 DIAGNOSIS — I872 Venous insufficiency (chronic) (peripheral): Secondary | ICD-10-CM | POA: Diagnosis not present

## 2016-11-09 DIAGNOSIS — L03119 Cellulitis of unspecified part of limb: Secondary | ICD-10-CM | POA: Diagnosis not present

## 2016-11-09 DIAGNOSIS — R0603 Acute respiratory distress: Secondary | ICD-10-CM | POA: Diagnosis not present

## 2016-11-09 DIAGNOSIS — E039 Hypothyroidism, unspecified: Secondary | ICD-10-CM | POA: Diagnosis not present

## 2016-11-09 DIAGNOSIS — R278 Other lack of coordination: Secondary | ICD-10-CM | POA: Diagnosis not present

## 2016-11-09 DIAGNOSIS — D631 Anemia in chronic kidney disease: Secondary | ICD-10-CM | POA: Diagnosis not present

## 2016-11-09 DIAGNOSIS — J9600 Acute respiratory failure, unspecified whether with hypoxia or hypercapnia: Secondary | ICD-10-CM | POA: Diagnosis not present

## 2016-11-09 DIAGNOSIS — L03116 Cellulitis of left lower limb: Secondary | ICD-10-CM | POA: Diagnosis not present

## 2016-11-09 DIAGNOSIS — I2510 Atherosclerotic heart disease of native coronary artery without angina pectoris: Secondary | ICD-10-CM | POA: Diagnosis not present

## 2016-11-09 DIAGNOSIS — I509 Heart failure, unspecified: Secondary | ICD-10-CM | POA: Diagnosis not present

## 2016-11-09 DIAGNOSIS — G629 Polyneuropathy, unspecified: Secondary | ICD-10-CM | POA: Diagnosis not present

## 2016-11-09 DIAGNOSIS — I5031 Acute diastolic (congestive) heart failure: Secondary | ICD-10-CM | POA: Diagnosis not present

## 2016-11-10 DIAGNOSIS — N186 End stage renal disease: Secondary | ICD-10-CM | POA: Diagnosis not present

## 2016-11-10 DIAGNOSIS — N2581 Secondary hyperparathyroidism of renal origin: Secondary | ICD-10-CM | POA: Diagnosis not present

## 2016-11-11 DIAGNOSIS — L0390 Cellulitis, unspecified: Secondary | ICD-10-CM | POA: Diagnosis not present

## 2016-11-11 DIAGNOSIS — N186 End stage renal disease: Secondary | ICD-10-CM | POA: Diagnosis not present

## 2016-11-11 DIAGNOSIS — I509 Heart failure, unspecified: Secondary | ICD-10-CM | POA: Diagnosis not present

## 2016-11-11 DIAGNOSIS — I2510 Atherosclerotic heart disease of native coronary artery without angina pectoris: Secondary | ICD-10-CM | POA: Diagnosis not present

## 2016-11-11 DIAGNOSIS — Z992 Dependence on renal dialysis: Secondary | ICD-10-CM | POA: Diagnosis not present

## 2016-11-12 DIAGNOSIS — N2581 Secondary hyperparathyroidism of renal origin: Secondary | ICD-10-CM | POA: Diagnosis not present

## 2016-11-12 DIAGNOSIS — N186 End stage renal disease: Secondary | ICD-10-CM | POA: Diagnosis not present

## 2016-11-13 DIAGNOSIS — G629 Polyneuropathy, unspecified: Secondary | ICD-10-CM | POA: Diagnosis not present

## 2016-11-13 DIAGNOSIS — Z992 Dependence on renal dialysis: Secondary | ICD-10-CM | POA: Diagnosis not present

## 2016-11-13 DIAGNOSIS — L03119 Cellulitis of unspecified part of limb: Secondary | ICD-10-CM | POA: Diagnosis not present

## 2016-11-13 DIAGNOSIS — I509 Heart failure, unspecified: Secondary | ICD-10-CM | POA: Diagnosis not present

## 2016-11-13 DIAGNOSIS — L03116 Cellulitis of left lower limb: Secondary | ICD-10-CM | POA: Diagnosis not present

## 2016-11-13 DIAGNOSIS — I872 Venous insufficiency (chronic) (peripheral): Secondary | ICD-10-CM | POA: Diagnosis not present

## 2016-11-13 DIAGNOSIS — J9600 Acute respiratory failure, unspecified whether with hypoxia or hypercapnia: Secondary | ICD-10-CM | POA: Diagnosis not present

## 2016-11-13 DIAGNOSIS — E039 Hypothyroidism, unspecified: Secondary | ICD-10-CM | POA: Diagnosis not present

## 2016-11-13 DIAGNOSIS — N186 End stage renal disease: Secondary | ICD-10-CM | POA: Diagnosis not present

## 2016-11-13 DIAGNOSIS — R0603 Acute respiratory distress: Secondary | ICD-10-CM | POA: Diagnosis not present

## 2016-11-13 DIAGNOSIS — I739 Peripheral vascular disease, unspecified: Secondary | ICD-10-CM | POA: Diagnosis not present

## 2016-11-14 DIAGNOSIS — N186 End stage renal disease: Secondary | ICD-10-CM | POA: Diagnosis not present

## 2016-11-14 DIAGNOSIS — N2581 Secondary hyperparathyroidism of renal origin: Secondary | ICD-10-CM | POA: Diagnosis not present

## 2016-11-17 ENCOUNTER — Ambulatory Visit (HOSPITAL_COMMUNITY): Admission: RE | Admit: 2016-11-17 | Payer: Commercial Managed Care - HMO | Source: Ambulatory Visit

## 2016-11-17 DIAGNOSIS — N2581 Secondary hyperparathyroidism of renal origin: Secondary | ICD-10-CM | POA: Diagnosis not present

## 2016-11-17 DIAGNOSIS — N186 End stage renal disease: Secondary | ICD-10-CM | POA: Diagnosis not present

## 2016-11-19 DIAGNOSIS — N2581 Secondary hyperparathyroidism of renal origin: Secondary | ICD-10-CM | POA: Diagnosis not present

## 2016-11-19 DIAGNOSIS — N186 End stage renal disease: Secondary | ICD-10-CM | POA: Diagnosis not present

## 2016-11-20 DIAGNOSIS — N186 End stage renal disease: Secondary | ICD-10-CM | POA: Diagnosis not present

## 2016-11-20 DIAGNOSIS — L03119 Cellulitis of unspecified part of limb: Secondary | ICD-10-CM | POA: Diagnosis not present

## 2016-11-20 DIAGNOSIS — Z992 Dependence on renal dialysis: Secondary | ICD-10-CM | POA: Diagnosis not present

## 2016-11-20 DIAGNOSIS — R0603 Acute respiratory distress: Secondary | ICD-10-CM | POA: Diagnosis not present

## 2016-11-20 DIAGNOSIS — I2510 Atherosclerotic heart disease of native coronary artery without angina pectoris: Secondary | ICD-10-CM | POA: Diagnosis not present

## 2016-11-21 DIAGNOSIS — N186 End stage renal disease: Secondary | ICD-10-CM | POA: Diagnosis not present

## 2016-11-21 DIAGNOSIS — N2581 Secondary hyperparathyroidism of renal origin: Secondary | ICD-10-CM | POA: Diagnosis not present

## 2016-11-24 DIAGNOSIS — D631 Anemia in chronic kidney disease: Secondary | ICD-10-CM | POA: Diagnosis not present

## 2016-11-24 DIAGNOSIS — L97821 Non-pressure chronic ulcer of other part of left lower leg limited to breakdown of skin: Secondary | ICD-10-CM | POA: Diagnosis not present

## 2016-11-24 DIAGNOSIS — I2510 Atherosclerotic heart disease of native coronary artery without angina pectoris: Secondary | ICD-10-CM | POA: Diagnosis not present

## 2016-11-24 DIAGNOSIS — N2581 Secondary hyperparathyroidism of renal origin: Secondary | ICD-10-CM | POA: Diagnosis not present

## 2016-11-24 DIAGNOSIS — I739 Peripheral vascular disease, unspecified: Secondary | ICD-10-CM | POA: Diagnosis not present

## 2016-11-24 DIAGNOSIS — I872 Venous insufficiency (chronic) (peripheral): Secondary | ICD-10-CM | POA: Diagnosis not present

## 2016-11-24 DIAGNOSIS — G629 Polyneuropathy, unspecified: Secondary | ICD-10-CM | POA: Diagnosis not present

## 2016-11-24 DIAGNOSIS — N186 End stage renal disease: Secondary | ICD-10-CM | POA: Diagnosis not present

## 2016-11-24 DIAGNOSIS — I509 Heart failure, unspecified: Secondary | ICD-10-CM | POA: Diagnosis not present

## 2016-11-24 DIAGNOSIS — I132 Hypertensive heart and chronic kidney disease with heart failure and with stage 5 chronic kidney disease, or end stage renal disease: Secondary | ICD-10-CM | POA: Diagnosis not present

## 2016-11-26 DIAGNOSIS — N186 End stage renal disease: Secondary | ICD-10-CM | POA: Diagnosis not present

## 2016-11-26 DIAGNOSIS — N2581 Secondary hyperparathyroidism of renal origin: Secondary | ICD-10-CM | POA: Diagnosis not present

## 2016-11-28 DIAGNOSIS — N186 End stage renal disease: Secondary | ICD-10-CM | POA: Diagnosis not present

## 2016-11-28 DIAGNOSIS — N2581 Secondary hyperparathyroidism of renal origin: Secondary | ICD-10-CM | POA: Diagnosis not present

## 2016-11-29 DIAGNOSIS — G629 Polyneuropathy, unspecified: Secondary | ICD-10-CM | POA: Diagnosis not present

## 2016-11-29 DIAGNOSIS — I509 Heart failure, unspecified: Secondary | ICD-10-CM | POA: Diagnosis not present

## 2016-11-29 DIAGNOSIS — I872 Venous insufficiency (chronic) (peripheral): Secondary | ICD-10-CM | POA: Diagnosis not present

## 2016-11-29 DIAGNOSIS — N186 End stage renal disease: Secondary | ICD-10-CM | POA: Diagnosis not present

## 2016-11-29 DIAGNOSIS — I2510 Atherosclerotic heart disease of native coronary artery without angina pectoris: Secondary | ICD-10-CM | POA: Diagnosis not present

## 2016-11-29 DIAGNOSIS — D631 Anemia in chronic kidney disease: Secondary | ICD-10-CM | POA: Diagnosis not present

## 2016-11-29 DIAGNOSIS — I739 Peripheral vascular disease, unspecified: Secondary | ICD-10-CM | POA: Diagnosis not present

## 2016-11-29 DIAGNOSIS — L97821 Non-pressure chronic ulcer of other part of left lower leg limited to breakdown of skin: Secondary | ICD-10-CM | POA: Diagnosis not present

## 2016-11-29 DIAGNOSIS — I132 Hypertensive heart and chronic kidney disease with heart failure and with stage 5 chronic kidney disease, or end stage renal disease: Secondary | ICD-10-CM | POA: Diagnosis not present

## 2016-12-01 DIAGNOSIS — N2581 Secondary hyperparathyroidism of renal origin: Secondary | ICD-10-CM | POA: Diagnosis not present

## 2016-12-01 DIAGNOSIS — N186 End stage renal disease: Secondary | ICD-10-CM | POA: Diagnosis not present

## 2016-12-02 ENCOUNTER — Telehealth: Payer: Self-pay

## 2016-12-02 DIAGNOSIS — N184 Chronic kidney disease, stage 4 (severe): Secondary | ICD-10-CM | POA: Diagnosis not present

## 2016-12-02 DIAGNOSIS — Z952 Presence of prosthetic heart valve: Secondary | ICD-10-CM | POA: Diagnosis not present

## 2016-12-02 DIAGNOSIS — L97929 Non-pressure chronic ulcer of unspecified part of left lower leg with unspecified severity: Secondary | ICD-10-CM | POA: Diagnosis not present

## 2016-12-02 DIAGNOSIS — I739 Peripheral vascular disease, unspecified: Secondary | ICD-10-CM | POA: Diagnosis not present

## 2016-12-02 DIAGNOSIS — I10 Essential (primary) hypertension: Secondary | ICD-10-CM | POA: Diagnosis not present

## 2016-12-02 DIAGNOSIS — F322 Major depressive disorder, single episode, severe without psychotic features: Secondary | ICD-10-CM | POA: Diagnosis not present

## 2016-12-02 DIAGNOSIS — I2510 Atherosclerotic heart disease of native coronary artery without angina pectoris: Secondary | ICD-10-CM | POA: Diagnosis not present

## 2016-12-02 DIAGNOSIS — B353 Tinea pedis: Secondary | ICD-10-CM | POA: Diagnosis not present

## 2016-12-02 DIAGNOSIS — F419 Anxiety disorder, unspecified: Secondary | ICD-10-CM | POA: Diagnosis not present

## 2016-12-02 NOTE — Telephone Encounter (Signed)
[facility] to [facility]. Said she would fax referral over. Said she would call Melinda in the morning.

## 2016-12-03 DIAGNOSIS — N2581 Secondary hyperparathyroidism of renal origin: Secondary | ICD-10-CM | POA: Diagnosis not present

## 2016-12-03 DIAGNOSIS — N186 End stage renal disease: Secondary | ICD-10-CM | POA: Diagnosis not present

## 2016-12-04 DIAGNOSIS — D631 Anemia in chronic kidney disease: Secondary | ICD-10-CM | POA: Diagnosis not present

## 2016-12-04 DIAGNOSIS — G629 Polyneuropathy, unspecified: Secondary | ICD-10-CM | POA: Diagnosis not present

## 2016-12-04 DIAGNOSIS — I739 Peripheral vascular disease, unspecified: Secondary | ICD-10-CM | POA: Diagnosis not present

## 2016-12-04 DIAGNOSIS — N186 End stage renal disease: Secondary | ICD-10-CM | POA: Diagnosis not present

## 2016-12-04 DIAGNOSIS — I132 Hypertensive heart and chronic kidney disease with heart failure and with stage 5 chronic kidney disease, or end stage renal disease: Secondary | ICD-10-CM | POA: Diagnosis not present

## 2016-12-04 DIAGNOSIS — I2510 Atherosclerotic heart disease of native coronary artery without angina pectoris: Secondary | ICD-10-CM | POA: Diagnosis not present

## 2016-12-04 DIAGNOSIS — I872 Venous insufficiency (chronic) (peripheral): Secondary | ICD-10-CM | POA: Diagnosis not present

## 2016-12-04 DIAGNOSIS — I509 Heart failure, unspecified: Secondary | ICD-10-CM | POA: Diagnosis not present

## 2016-12-04 DIAGNOSIS — L97821 Non-pressure chronic ulcer of other part of left lower leg limited to breakdown of skin: Secondary | ICD-10-CM | POA: Diagnosis not present

## 2016-12-05 DIAGNOSIS — N186 End stage renal disease: Secondary | ICD-10-CM | POA: Diagnosis not present

## 2016-12-05 DIAGNOSIS — N2581 Secondary hyperparathyroidism of renal origin: Secondary | ICD-10-CM | POA: Diagnosis not present

## 2016-12-07 DIAGNOSIS — I872 Venous insufficiency (chronic) (peripheral): Secondary | ICD-10-CM | POA: Diagnosis not present

## 2016-12-07 DIAGNOSIS — N186 End stage renal disease: Secondary | ICD-10-CM | POA: Diagnosis not present

## 2016-12-07 DIAGNOSIS — D631 Anemia in chronic kidney disease: Secondary | ICD-10-CM | POA: Diagnosis not present

## 2016-12-07 DIAGNOSIS — L97821 Non-pressure chronic ulcer of other part of left lower leg limited to breakdown of skin: Secondary | ICD-10-CM | POA: Diagnosis not present

## 2016-12-07 DIAGNOSIS — I132 Hypertensive heart and chronic kidney disease with heart failure and with stage 5 chronic kidney disease, or end stage renal disease: Secondary | ICD-10-CM | POA: Diagnosis not present

## 2016-12-07 DIAGNOSIS — G629 Polyneuropathy, unspecified: Secondary | ICD-10-CM | POA: Diagnosis not present

## 2016-12-07 DIAGNOSIS — I2510 Atherosclerotic heart disease of native coronary artery without angina pectoris: Secondary | ICD-10-CM | POA: Diagnosis not present

## 2016-12-07 DIAGNOSIS — I509 Heart failure, unspecified: Secondary | ICD-10-CM | POA: Diagnosis not present

## 2016-12-07 DIAGNOSIS — I739 Peripheral vascular disease, unspecified: Secondary | ICD-10-CM | POA: Diagnosis not present

## 2016-12-08 DIAGNOSIS — N186 End stage renal disease: Secondary | ICD-10-CM | POA: Diagnosis not present

## 2016-12-08 DIAGNOSIS — N2581 Secondary hyperparathyroidism of renal origin: Secondary | ICD-10-CM | POA: Diagnosis not present

## 2016-12-09 DIAGNOSIS — N186 End stage renal disease: Secondary | ICD-10-CM | POA: Diagnosis not present

## 2016-12-09 DIAGNOSIS — I872 Venous insufficiency (chronic) (peripheral): Secondary | ICD-10-CM | POA: Diagnosis not present

## 2016-12-09 DIAGNOSIS — G629 Polyneuropathy, unspecified: Secondary | ICD-10-CM | POA: Diagnosis not present

## 2016-12-09 DIAGNOSIS — Z992 Dependence on renal dialysis: Secondary | ICD-10-CM | POA: Diagnosis not present

## 2016-12-09 DIAGNOSIS — I132 Hypertensive heart and chronic kidney disease with heart failure and with stage 5 chronic kidney disease, or end stage renal disease: Secondary | ICD-10-CM | POA: Diagnosis not present

## 2016-12-09 DIAGNOSIS — L97821 Non-pressure chronic ulcer of other part of left lower leg limited to breakdown of skin: Secondary | ICD-10-CM | POA: Diagnosis not present

## 2016-12-09 DIAGNOSIS — I739 Peripheral vascular disease, unspecified: Secondary | ICD-10-CM | POA: Diagnosis not present

## 2016-12-09 DIAGNOSIS — I509 Heart failure, unspecified: Secondary | ICD-10-CM | POA: Diagnosis not present

## 2016-12-09 DIAGNOSIS — I2510 Atherosclerotic heart disease of native coronary artery without angina pectoris: Secondary | ICD-10-CM | POA: Diagnosis not present

## 2016-12-09 DIAGNOSIS — D631 Anemia in chronic kidney disease: Secondary | ICD-10-CM | POA: Diagnosis not present

## 2016-12-09 DIAGNOSIS — I158 Other secondary hypertension: Secondary | ICD-10-CM | POA: Diagnosis not present

## 2016-12-10 DIAGNOSIS — N186 End stage renal disease: Secondary | ICD-10-CM | POA: Diagnosis not present

## 2016-12-10 DIAGNOSIS — N2581 Secondary hyperparathyroidism of renal origin: Secondary | ICD-10-CM | POA: Diagnosis not present

## 2016-12-11 ENCOUNTER — Encounter (HOSPITAL_BASED_OUTPATIENT_CLINIC_OR_DEPARTMENT_OTHER): Payer: Medicare HMO | Attending: Internal Medicine

## 2016-12-11 DIAGNOSIS — I2510 Atherosclerotic heart disease of native coronary artery without angina pectoris: Secondary | ICD-10-CM | POA: Insufficient documentation

## 2016-12-11 DIAGNOSIS — N189 Chronic kidney disease, unspecified: Secondary | ICD-10-CM | POA: Diagnosis not present

## 2016-12-11 DIAGNOSIS — L97228 Non-pressure chronic ulcer of left calf with other specified severity: Secondary | ICD-10-CM | POA: Diagnosis not present

## 2016-12-11 DIAGNOSIS — E039 Hypothyroidism, unspecified: Secondary | ICD-10-CM | POA: Diagnosis not present

## 2016-12-11 DIAGNOSIS — Z951 Presence of aortocoronary bypass graft: Secondary | ICD-10-CM | POA: Insufficient documentation

## 2016-12-11 DIAGNOSIS — I872 Venous insufficiency (chronic) (peripheral): Secondary | ICD-10-CM | POA: Diagnosis not present

## 2016-12-11 DIAGNOSIS — Z8673 Personal history of transient ischemic attack (TIA), and cerebral infarction without residual deficits: Secondary | ICD-10-CM | POA: Insufficient documentation

## 2016-12-11 DIAGNOSIS — L97821 Non-pressure chronic ulcer of other part of left lower leg limited to breakdown of skin: Secondary | ICD-10-CM | POA: Diagnosis not present

## 2016-12-11 DIAGNOSIS — I739 Peripheral vascular disease, unspecified: Secondary | ICD-10-CM | POA: Diagnosis not present

## 2016-12-11 DIAGNOSIS — I70242 Atherosclerosis of native arteries of left leg with ulceration of calf: Secondary | ICD-10-CM | POA: Diagnosis not present

## 2016-12-11 DIAGNOSIS — I70232 Atherosclerosis of native arteries of right leg with ulceration of calf: Secondary | ICD-10-CM | POA: Insufficient documentation

## 2016-12-11 DIAGNOSIS — Z992 Dependence on renal dialysis: Secondary | ICD-10-CM | POA: Insufficient documentation

## 2016-12-11 DIAGNOSIS — D631 Anemia in chronic kidney disease: Secondary | ICD-10-CM | POA: Diagnosis not present

## 2016-12-11 DIAGNOSIS — L97218 Non-pressure chronic ulcer of right calf with other specified severity: Secondary | ICD-10-CM | POA: Insufficient documentation

## 2016-12-11 DIAGNOSIS — S81801A Unspecified open wound, right lower leg, initial encounter: Secondary | ICD-10-CM | POA: Diagnosis not present

## 2016-12-11 DIAGNOSIS — N186 End stage renal disease: Secondary | ICD-10-CM | POA: Diagnosis not present

## 2016-12-11 DIAGNOSIS — I509 Heart failure, unspecified: Secondary | ICD-10-CM | POA: Diagnosis not present

## 2016-12-11 DIAGNOSIS — R011 Cardiac murmur, unspecified: Secondary | ICD-10-CM | POA: Diagnosis not present

## 2016-12-11 DIAGNOSIS — I132 Hypertensive heart and chronic kidney disease with heart failure and with stage 5 chronic kidney disease, or end stage renal disease: Secondary | ICD-10-CM | POA: Diagnosis not present

## 2016-12-11 DIAGNOSIS — G629 Polyneuropathy, unspecified: Secondary | ICD-10-CM | POA: Diagnosis not present

## 2016-12-12 DIAGNOSIS — N2581 Secondary hyperparathyroidism of renal origin: Secondary | ICD-10-CM | POA: Diagnosis not present

## 2016-12-12 DIAGNOSIS — N186 End stage renal disease: Secondary | ICD-10-CM | POA: Diagnosis not present

## 2016-12-14 DIAGNOSIS — N186 End stage renal disease: Secondary | ICD-10-CM | POA: Diagnosis not present

## 2016-12-14 DIAGNOSIS — N2581 Secondary hyperparathyroidism of renal origin: Secondary | ICD-10-CM | POA: Diagnosis not present

## 2016-12-15 ENCOUNTER — Ambulatory Visit (INDEPENDENT_AMBULATORY_CARE_PROVIDER_SITE_OTHER): Payer: Medicare HMO | Admitting: Vascular Surgery

## 2016-12-15 ENCOUNTER — Other Ambulatory Visit: Payer: Self-pay

## 2016-12-15 ENCOUNTER — Encounter: Payer: Self-pay | Admitting: Vascular Surgery

## 2016-12-15 VITALS — BP 140/61 | HR 72 | Temp 98.4°F | Resp 20 | Ht 60.0 in | Wt 151.0 lb

## 2016-12-15 DIAGNOSIS — I998 Other disorder of circulatory system: Secondary | ICD-10-CM | POA: Diagnosis not present

## 2016-12-15 DIAGNOSIS — I70229 Atherosclerosis of native arteries of extremities with rest pain, unspecified extremity: Secondary | ICD-10-CM

## 2016-12-15 NOTE — Progress Notes (Signed)
[facility] [place]  Patient name: Jill Johnson MRN: [id number] DOB: 12/18/1940 Sex: female  REASON FOR VISIT: Evaluate for critical limb ischemia left leg  HPI: Jill Johnson is a 76 y.o. female known to me from prior left upper arm AV fistula creation. She is now progressed to renal failure and is being dialyzed via her fistula. She recently was admitted to the hospital with worsening congestive heart failure and pulmonary worsening. Also had ulcerations over her foot and pain. She was seen by Dr. Imogene Burn who had recommended outpatient follow-up. She is here today with 2 sons. She is miserable. She has severe unrelenting pain in her left foot. This is worse at night and she is unable to me recumbent due to this and has to sleep in a recliner. She reports that she has worsening pain on hemodialysis. She does have ulceration over the medial aspect of her distal calf ulcerations over the tips of all of her toes.  Past Medical History:  Diagnosis Date  . Anemia   . CAD (coronary artery disease)   . CFS (chronic fatigue syndrome)   . Chronic rhinitis   . Complication of anesthesia    "a little hard waking up"  . Constipation    due to iron  . Coronary atherosclerosis   . Depression   . Difficult intubation    "I have a small opening" When she had her CABG   . DJD (degenerative joint disease)   . Hyperlipidemia   . Hypertension   . Hypothyroidism   . Kidney stone   . MDD (major depressive disorder)   . Neuropathy (HCC)    in legs  . Obesity   . Osteopenia   . Renal insufficiency    Post Op  . Stroke [facility])    TIA, several    Family History  Problem Relation Age of Onset  . Alzheimer's disease Mother   . Aortic stenosis Father   . Heart disease Father   . Heart disease Son     SOCIAL HISTORY: Social History  Substance Use Topics  . Smoking status: Never Smoker  . Smokeless tobacco: Never Used  . Alcohol use No      Allergies  Allergen Reactions  . Prednisone Other (See Comments)    Makes her loony    Current Outpatient Prescriptions  Medication Sig Dispense Refill  . acetaminophen (TYLENOL) 325 MG tablet Take 2 tablets (650 mg total) by mouth every 6 (six) hours as needed for mild pain (or Fever >/= 101). 30 tablet 0  . amLODipine (NORVASC) 10 MG tablet Take 1 tablet (10 mg total) by mouth daily. 30 tablet 5  . aspirin 81 MG tablet Take 1 tablet (81 mg total) by mouth daily. 30 tablet 0  . calcium acetate (PHOSLO) 667 MG capsule Take by mouth 3 (three) times daily with meals.    . cholecalciferol (VITAMIN D) 1000 units tablet Take 2,000 Units by mouth daily.     . Cyanocobalamin (VITAMIN B-12 PO) Take 1 tablet by mouth daily.    . Darbepoetin Alfa (ARANESP) 150 MCG/0.3ML SOSY injection Inject 0.3 mLs (150 mcg total) into the vein every Wednesday with hemodialysis. 1.68 mL   . Dextromethorphan-Guaifenesin (CORICIDIN HBP CONGESTION/COUGH) 10-200 MG CAPS Take 1 capsule by mouth daily as needed (cold).    . gabapentin (NEURONTIN) 300 MG capsule Take 300 mg by mouth daily.     Marland Kitchen levothyroxine (SYNTHROID, LEVOTHROID) 112 MCG tablet Take 112  mcg by mouth daily before breakfast.     . losartan (COZAAR) 50 MG tablet Take 1 tablet (50 mg total) by mouth at bedtime. 30 tablet 0  . senna-docusate (SENOKOT-S) 8.6-50 MG tablet Take 1 tablet by mouth at bedtime as needed for mild constipation. 30 tablet 0   No current facility-administered medications for this visit.     REVIEW OF SYSTEMS:  [X]  denotes positive finding, [ ]  denotes negative finding Cardiac  Comments:  Chest pain or chest pressure:    Shortness of breath upon exertion: x   Short of breath when lying flat: x   Irregular heart rhythm:        Vascular    Pain in calf, thigh, or hip brought on by ambulation:    Pain in feet at night that wakes you up from your sleep:  x   Blood clot in your veins:    Leg swelling:           PHYSICAL  EXAM: Vitals:   12/15/16 1431  BP: 140/61  Pulse: 72  Resp: 20  Temp: 98.4 F (36.9 C)  TempSrc: Oral  SpO2: 95%  Weight: 151 lb (68.5 kg)  Height: 5' (1.524 m)    GENERAL: The patient is a well-nourished female, in no acute distress. The vital signs are documented above. CARDIOVASCULAR: Absent pedal pulses bilaterally. Excellent maturation of her left upper arm AV fistula PULMONARY: There is good air exchange  MUSCULOSKELETAL: There are no major deformities or cyanosis. NEUROLOGIC: No focal weakness or paresthesias are detected. SKIN: 3 cm ulceration over the medial aspect of her left calf with poor granulation base. Marked erythema and dependent rubor over her entire left foot. No evidence of ischemia in her right foot PSYCHIATRIC: The patient has a normal affect.    MEDICAL ISSUES: Critical limb ischemia left foot. Explained that she clearly does not have adequate flow to heal the ulcerations. Recommended that she undergo outpatient arteriography as soon as possible for further evaluation. Explained that she obviously it is markedly increased risk for any invasive procedure due to her multiple medical conditions. Explained that she is at high risk for amputation if revascularization is not possible. Have scheduled her arteriogram as an outpatient on 12/20/2016 with plans to follow    Larina Earthlyodd F. Rache Klimaszewski, MD [facility] Office Tel [phone] Pager [phone]

## 2016-12-16 DIAGNOSIS — G629 Polyneuropathy, unspecified: Secondary | ICD-10-CM | POA: Diagnosis not present

## 2016-12-16 DIAGNOSIS — I132 Hypertensive heart and chronic kidney disease with heart failure and with stage 5 chronic kidney disease, or end stage renal disease: Secondary | ICD-10-CM | POA: Diagnosis not present

## 2016-12-16 DIAGNOSIS — N186 End stage renal disease: Secondary | ICD-10-CM | POA: Diagnosis not present

## 2016-12-16 DIAGNOSIS — I739 Peripheral vascular disease, unspecified: Secondary | ICD-10-CM | POA: Diagnosis not present

## 2016-12-16 DIAGNOSIS — I509 Heart failure, unspecified: Secondary | ICD-10-CM | POA: Diagnosis not present

## 2016-12-16 DIAGNOSIS — I872 Venous insufficiency (chronic) (peripheral): Secondary | ICD-10-CM | POA: Diagnosis not present

## 2016-12-16 DIAGNOSIS — D631 Anemia in chronic kidney disease: Secondary | ICD-10-CM | POA: Diagnosis not present

## 2016-12-16 DIAGNOSIS — I2510 Atherosclerotic heart disease of native coronary artery without angina pectoris: Secondary | ICD-10-CM | POA: Diagnosis not present

## 2016-12-16 DIAGNOSIS — L97821 Non-pressure chronic ulcer of other part of left lower leg limited to breakdown of skin: Secondary | ICD-10-CM | POA: Diagnosis not present

## 2016-12-17 DIAGNOSIS — N2581 Secondary hyperparathyroidism of renal origin: Secondary | ICD-10-CM | POA: Diagnosis not present

## 2016-12-17 DIAGNOSIS — N186 End stage renal disease: Secondary | ICD-10-CM | POA: Diagnosis not present

## 2016-12-18 ENCOUNTER — Inpatient Hospital Stay (HOSPITAL_COMMUNITY)
Admission: AD | Admit: 2016-12-18 | Discharge: 2017-01-07 | DRG: 246 | Disposition: E | Payer: Medicare HMO | Source: Ambulatory Visit | Attending: Internal Medicine | Admitting: Internal Medicine

## 2016-12-18 ENCOUNTER — Encounter (HOSPITAL_COMMUNITY): Admission: AD | Disposition: E | Payer: Self-pay | Source: Ambulatory Visit | Attending: Internal Medicine

## 2016-12-18 ENCOUNTER — Encounter (HOSPITAL_COMMUNITY): Payer: Self-pay | Admitting: Physician Assistant

## 2016-12-18 ENCOUNTER — Ambulatory Visit (HOSPITAL_COMMUNITY): Admit: 2016-12-18 | Payer: Medicare HMO | Admitting: Internal Medicine

## 2016-12-18 ENCOUNTER — Encounter (HOSPITAL_BASED_OUTPATIENT_CLINIC_OR_DEPARTMENT_OTHER): Payer: Commercial Managed Care - HMO

## 2016-12-18 DIAGNOSIS — N2581 Secondary hyperparathyroidism of renal origin: Secondary | ICD-10-CM | POA: Diagnosis not present

## 2016-12-18 DIAGNOSIS — Z820 Family history of epilepsy and other diseases of the nervous system: Secondary | ICD-10-CM

## 2016-12-18 DIAGNOSIS — I998 Other disorder of circulatory system: Secondary | ICD-10-CM | POA: Diagnosis present

## 2016-12-18 DIAGNOSIS — Z6830 Body mass index (BMI) 30.0-30.9, adult: Secondary | ICD-10-CM

## 2016-12-18 DIAGNOSIS — I25710 Atherosclerosis of autologous vein coronary artery bypass graft(s) with unstable angina pectoris: Secondary | ICD-10-CM

## 2016-12-18 DIAGNOSIS — I7092 Chronic total occlusion of artery of the extremities: Secondary | ICD-10-CM | POA: Diagnosis present

## 2016-12-18 DIAGNOSIS — L8990 Pressure ulcer of unspecified site, unspecified stage: Secondary | ICD-10-CM | POA: Diagnosis present

## 2016-12-18 DIAGNOSIS — E871 Hypo-osmolality and hyponatremia: Secondary | ICD-10-CM | POA: Diagnosis not present

## 2016-12-18 DIAGNOSIS — Z4901 Encounter for fitting and adjustment of extracorporeal dialysis catheter: Secondary | ICD-10-CM | POA: Diagnosis not present

## 2016-12-18 DIAGNOSIS — I200 Unstable angina: Secondary | ICD-10-CM

## 2016-12-18 DIAGNOSIS — R079 Chest pain, unspecified: Secondary | ICD-10-CM | POA: Diagnosis present

## 2016-12-18 DIAGNOSIS — Z66 Do not resuscitate: Secondary | ICD-10-CM | POA: Diagnosis not present

## 2016-12-18 DIAGNOSIS — Y832 Surgical operation with anastomosis, bypass or graft as the cause of abnormal reaction of the patient, or of later complication, without mention of misadventure at the time of the procedure: Secondary | ICD-10-CM | POA: Diagnosis present

## 2016-12-18 DIAGNOSIS — Z9181 History of falling: Secondary | ICD-10-CM

## 2016-12-18 DIAGNOSIS — F05 Delirium due to known physiological condition: Secondary | ICD-10-CM | POA: Diagnosis present

## 2016-12-18 DIAGNOSIS — Z951 Presence of aortocoronary bypass graft: Secondary | ICD-10-CM

## 2016-12-18 DIAGNOSIS — N186 End stage renal disease: Secondary | ICD-10-CM | POA: Diagnosis not present

## 2016-12-18 DIAGNOSIS — R451 Restlessness and agitation: Secondary | ICD-10-CM | POA: Diagnosis not present

## 2016-12-18 DIAGNOSIS — I120 Hypertensive chronic kidney disease with stage 5 chronic kidney disease or end stage renal disease: Secondary | ICD-10-CM | POA: Diagnosis not present

## 2016-12-18 DIAGNOSIS — E039 Hypothyroidism, unspecified: Secondary | ICD-10-CM | POA: Diagnosis not present

## 2016-12-18 DIAGNOSIS — I639 Cerebral infarction, unspecified: Secondary | ICD-10-CM | POA: Diagnosis not present

## 2016-12-18 DIAGNOSIS — K5900 Constipation, unspecified: Secondary | ICD-10-CM | POA: Diagnosis present

## 2016-12-18 DIAGNOSIS — R410 Disorientation, unspecified: Secondary | ICD-10-CM

## 2016-12-18 DIAGNOSIS — Z87442 Personal history of urinary calculi: Secondary | ICD-10-CM

## 2016-12-18 DIAGNOSIS — I214 Non-ST elevation (NSTEMI) myocardial infarction: Secondary | ICD-10-CM | POA: Diagnosis present

## 2016-12-18 DIAGNOSIS — Z818 Family history of other mental and behavioral disorders: Secondary | ICD-10-CM

## 2016-12-18 DIAGNOSIS — T82857A Stenosis of cardiac prosthetic devices, implants and grafts, initial encounter: Secondary | ICD-10-CM | POA: Diagnosis not present

## 2016-12-18 DIAGNOSIS — Z8673 Personal history of transient ischemic attack (TIA), and cerebral infarction without residual deficits: Secondary | ICD-10-CM

## 2016-12-18 DIAGNOSIS — I739 Peripheral vascular disease, unspecified: Secondary | ICD-10-CM

## 2016-12-18 DIAGNOSIS — Z952 Presence of prosthetic heart valve: Secondary | ICD-10-CM | POA: Diagnosis not present

## 2016-12-18 DIAGNOSIS — L97229 Non-pressure chronic ulcer of left calf with unspecified severity: Secondary | ICD-10-CM | POA: Diagnosis present

## 2016-12-18 DIAGNOSIS — I6789 Other cerebrovascular disease: Secondary | ICD-10-CM | POA: Diagnosis not present

## 2016-12-18 DIAGNOSIS — E038 Other specified hypothyroidism: Secondary | ICD-10-CM | POA: Diagnosis not present

## 2016-12-18 DIAGNOSIS — G4700 Insomnia, unspecified: Secondary | ICD-10-CM | POA: Diagnosis not present

## 2016-12-18 DIAGNOSIS — F0280 Dementia in other diseases classified elsewhere without behavioral disturbance: Secondary | ICD-10-CM | POA: Diagnosis present

## 2016-12-18 DIAGNOSIS — Z992 Dependence on renal dialysis: Secondary | ICD-10-CM

## 2016-12-18 DIAGNOSIS — R1310 Dysphagia, unspecified: Secondary | ICD-10-CM | POA: Diagnosis not present

## 2016-12-18 DIAGNOSIS — M1611 Unilateral primary osteoarthritis, right hip: Secondary | ICD-10-CM | POA: Diagnosis not present

## 2016-12-18 DIAGNOSIS — D638 Anemia in other chronic diseases classified elsewhere: Secondary | ICD-10-CM | POA: Diagnosis present

## 2016-12-18 DIAGNOSIS — I6330 Cerebral infarction due to thrombosis of unspecified cerebral artery: Secondary | ICD-10-CM | POA: Diagnosis not present

## 2016-12-18 DIAGNOSIS — I21A9 Other myocardial infarction type: Secondary | ICD-10-CM | POA: Diagnosis present

## 2016-12-18 DIAGNOSIS — I517 Cardiomegaly: Secondary | ICD-10-CM | POA: Diagnosis not present

## 2016-12-18 DIAGNOSIS — Z953 Presence of xenogenic heart valve: Secondary | ICD-10-CM

## 2016-12-18 DIAGNOSIS — D631 Anemia in chronic kidney disease: Secondary | ICD-10-CM | POA: Diagnosis present

## 2016-12-18 DIAGNOSIS — R58 Hemorrhage, not elsewhere classified: Secondary | ICD-10-CM | POA: Diagnosis not present

## 2016-12-18 DIAGNOSIS — Z7189 Other specified counseling: Secondary | ICD-10-CM | POA: Diagnosis not present

## 2016-12-18 DIAGNOSIS — Z8249 Family history of ischemic heart disease and other diseases of the circulatory system: Secondary | ICD-10-CM

## 2016-12-18 DIAGNOSIS — L97529 Non-pressure chronic ulcer of other part of left foot with unspecified severity: Secondary | ICD-10-CM | POA: Diagnosis present

## 2016-12-18 DIAGNOSIS — I70245 Atherosclerosis of native arteries of left leg with ulceration of other part of foot: Secondary | ICD-10-CM | POA: Diagnosis not present

## 2016-12-18 DIAGNOSIS — E668 Other obesity: Secondary | ICD-10-CM | POA: Diagnosis present

## 2016-12-18 DIAGNOSIS — E876 Hypokalemia: Secondary | ICD-10-CM | POA: Diagnosis not present

## 2016-12-18 DIAGNOSIS — G8194 Hemiplegia, unspecified affecting left nondominant side: Secondary | ICD-10-CM | POA: Diagnosis not present

## 2016-12-18 DIAGNOSIS — I999 Unspecified disorder of circulatory system: Secondary | ICD-10-CM | POA: Diagnosis not present

## 2016-12-18 DIAGNOSIS — I25119 Atherosclerotic heart disease of native coronary artery with unspecified angina pectoris: Secondary | ICD-10-CM | POA: Diagnosis not present

## 2016-12-18 DIAGNOSIS — E785 Hyperlipidemia, unspecified: Secondary | ICD-10-CM | POA: Diagnosis present

## 2016-12-18 DIAGNOSIS — I70229 Atherosclerosis of native arteries of extremities with rest pain, unspecified extremity: Secondary | ICD-10-CM

## 2016-12-18 DIAGNOSIS — I9581 Postprocedural hypotension: Secondary | ICD-10-CM | POA: Diagnosis not present

## 2016-12-18 DIAGNOSIS — I70262 Atherosclerosis of native arteries of extremities with gangrene, left leg: Secondary | ICD-10-CM | POA: Diagnosis present

## 2016-12-18 DIAGNOSIS — Z515 Encounter for palliative care: Secondary | ICD-10-CM | POA: Diagnosis not present

## 2016-12-18 DIAGNOSIS — M25551 Pain in right hip: Secondary | ICD-10-CM

## 2016-12-18 DIAGNOSIS — I25110 Atherosclerotic heart disease of native coronary artery with unstable angina pectoris: Secondary | ICD-10-CM | POA: Diagnosis present

## 2016-12-18 DIAGNOSIS — Z888 Allergy status to other drugs, medicaments and biological substances status: Secondary | ICD-10-CM

## 2016-12-18 DIAGNOSIS — G309 Alzheimer's disease, unspecified: Secondary | ICD-10-CM | POA: Diagnosis not present

## 2016-12-18 DIAGNOSIS — F419 Anxiety disorder, unspecified: Secondary | ICD-10-CM | POA: Diagnosis present

## 2016-12-18 DIAGNOSIS — M6281 Muscle weakness (generalized): Secondary | ICD-10-CM

## 2016-12-18 HISTORY — DX: Peripheral vascular disease, unspecified: I73.9

## 2016-12-18 HISTORY — DX: Atherosclerosis of native arteries of extremities with rest pain, unspecified extremity: I70.229

## 2016-12-18 HISTORY — DX: End stage renal disease: N18.6

## 2016-12-18 HISTORY — PX: CORONARY STENT INTERVENTION: CATH118234

## 2016-12-18 HISTORY — DX: Other disorder of circulatory system: I99.8

## 2016-12-18 HISTORY — DX: Dependence on renal dialysis: Z99.2

## 2016-12-18 HISTORY — PX: LEFT HEART CATH AND CORS/GRAFTS ANGIOGRAPHY: CATH118250

## 2016-12-18 LAB — CBC
HCT: 24.7 % — ABNORMAL LOW (ref 36.0–46.0)
Hemoglobin: 7.9 g/dL — ABNORMAL LOW (ref 12.0–15.0)
MCH: 32.1 pg (ref 26.0–34.0)
MCHC: 32 g/dL (ref 30.0–36.0)
MCV: 100.4 fL — AB (ref 78.0–100.0)
Platelets: 283 10*3/uL (ref 150–400)
RBC: 2.46 MIL/uL — ABNORMAL LOW (ref 3.87–5.11)
RDW: 16.2 % — AB (ref 11.5–15.5)
WBC: 10.1 10*3/uL (ref 4.0–10.5)

## 2016-12-18 LAB — PROTIME-INR
INR: 1.18
PROTHROMBIN TIME: 15.1 s (ref 11.4–15.2)

## 2016-12-18 LAB — POCT ACTIVATED CLOTTING TIME
ACTIVATED CLOTTING TIME: 224 s
Activated Clotting Time: 191 seconds

## 2016-12-18 LAB — POCT I-STAT, CHEM 8
BUN: 28 mg/dL — AB (ref 6–20)
CHLORIDE: 98 mmol/L — AB (ref 101–111)
CREATININE: 3.5 mg/dL — AB (ref 0.44–1.00)
Calcium, Ion: 1.03 mmol/L — ABNORMAL LOW (ref 1.15–1.40)
Glucose, Bld: 98 mg/dL (ref 65–99)
HCT: 25 % — ABNORMAL LOW (ref 36.0–46.0)
Hemoglobin: 8.5 g/dL — ABNORMAL LOW (ref 12.0–15.0)
Potassium: 3.4 mmol/L — ABNORMAL LOW (ref 3.5–5.1)
Sodium: 135 mmol/L (ref 135–145)
TCO2: 29 mmol/L (ref 0–100)

## 2016-12-18 LAB — TROPONIN I: Troponin I: 0.96 ng/mL (ref <0.03)

## 2016-12-18 SURGERY — LEFT HEART CATH AND CORS/GRAFTS ANGIOGRAPHY

## 2016-12-18 SURGERY — ABDOMINAL AORTOGRAM W/LOWER EXTREMITY
Anesthesia: LOCAL

## 2016-12-18 MED ORDER — SODIUM CHLORIDE 0.9 % IV SOLN: INTRAVENOUS | Status: DC

## 2016-12-18 MED ORDER — OXYCODONE-ACETAMINOPHEN 5-325 MG PO TABS
1.0000 | ORAL_TABLET | ORAL | Status: DC | PRN
  Administered 2016-12-19 (×3): 2 via ORAL
  Administered 2016-12-19: 1 via ORAL
  Filled 2016-12-18: qty 2
  Filled 2016-12-18: qty 1

## 2016-12-18 MED ORDER — SODIUM CHLORIDE 0.9% FLUSH: 3.0000 mL | INTRAVENOUS | Status: DC | PRN

## 2016-12-18 MED ORDER — SENNOSIDES-DOCUSATE SODIUM 8.6-50 MG PO TABS
1.0000 | ORAL_TABLET | Freq: Every evening | ORAL | Status: DC | PRN
  Administered 2016-12-19: 1 via ORAL
  Filled 2016-12-18: qty 1

## 2016-12-18 MED ORDER — TRAMADOL HCL 50 MG PO TABS
ORAL_TABLET | ORAL | Status: AC
  Administered 2016-12-18: 50 mg via ORAL
  Filled 2016-12-18: qty 1

## 2016-12-18 MED ORDER — HYDROMORPHONE HCL 1 MG/ML IJ SOLN
INTRAMUSCULAR | Status: AC
  Filled 2016-12-18: qty 1

## 2016-12-18 MED ORDER — LIDOCAINE HCL (PF) 1 % IJ SOLN
INTRAMUSCULAR | Status: DC | PRN
  Administered 2016-12-18: 20 mL via INTRADERMAL

## 2016-12-18 MED ORDER — SODIUM CHLORIDE 0.9% FLUSH
3.0000 mL | Freq: Two times a day (BID) | INTRAVENOUS | Status: DC
  Administered 2016-12-20 – 2016-12-22 (×3): 3 mL via INTRAVENOUS

## 2016-12-18 MED ORDER — VITAMIN B-12 1000 MCG PO TABS
1500.0000 ug | ORAL_TABLET | Freq: Every day | ORAL | Status: DC
  Administered 2016-12-19 – 2017-01-01 (×12): 1500 ug via ORAL
  Filled 2016-12-18: qty 5
  Filled 2016-12-18: qty 1
  Filled 2016-12-18 (×2): qty 5
  Filled 2016-12-18: qty 2
  Filled 2016-12-18 (×4): qty 5
  Filled 2016-12-18: qty 2
  Filled 2016-12-18: qty 1
  Filled 2016-12-18 (×3): qty 5

## 2016-12-18 MED ORDER — MIDAZOLAM HCL 2 MG/2ML IJ SOLN
INTRAMUSCULAR | Status: DC | PRN
  Administered 2016-12-18 (×2): 1 mg via INTRAVENOUS

## 2016-12-18 MED ORDER — SODIUM CHLORIDE 0.9 % IV SOLN
250.0000 mL | INTRAVENOUS | Status: DC | PRN
Start: 2016-12-18 — End: 2016-12-18

## 2016-12-18 MED ORDER — IOPAMIDOL (ISOVUE-370) INJECTION 76%
INTRAVENOUS | Status: AC
  Filled 2016-12-18: qty 100

## 2016-12-18 MED ORDER — SODIUM CHLORIDE 0.9% FLUSH: 3.0000 mL | Freq: Two times a day (BID) | INTRAVENOUS | Status: DC

## 2016-12-18 MED ORDER — VITAMIN D 1000 UNITS PO TABS
2000.0000 [IU] | ORAL_TABLET | Freq: Every day | ORAL | Status: DC
  Administered 2016-12-19 – 2016-12-27 (×7): 2000 [IU] via ORAL
  Filled 2016-12-18 (×9): qty 2

## 2016-12-18 MED ORDER — HEPARIN SODIUM (PORCINE) 1000 UNIT/ML IJ SOLN
INTRAMUSCULAR | Status: DC | PRN
  Administered 2016-12-18: 4000 [IU] via INTRAVENOUS
  Administered 2016-12-18: 5000 [IU] via INTRAVENOUS
  Administered 2016-12-18: 6000 [IU] via INTRAVENOUS

## 2016-12-18 MED ORDER — NITROGLYCERIN 1 MG/10 ML FOR IR/CATH LAB
INTRA_ARTERIAL | Status: DC | PRN
  Administered 2016-12-18: 200 ug via INTRACORONARY

## 2016-12-18 MED ORDER — VITAMIN B-12 ER 1500 MCG PO TBCR: 1500.0000 ug | EXTENDED_RELEASE_TABLET | Freq: Every day | ORAL | Status: DC

## 2016-12-18 MED ORDER — ONDANSETRON HCL 4 MG/2ML IJ SOLN: 4.0000 mg | Freq: Four times a day (QID) | INTRAMUSCULAR | Status: DC | PRN

## 2016-12-18 MED ORDER — HYDROMORPHONE HCL 1 MG/ML IJ SOLN
INTRAMUSCULAR | Status: DC | PRN
  Administered 2016-12-18 (×2): 1 mg via INTRAVENOUS

## 2016-12-18 MED ORDER — CARVEDILOL 3.125 MG PO TABS
3.1250 mg | ORAL_TABLET | Freq: Two times a day (BID) | ORAL | Status: DC
  Administered 2016-12-19 – 2017-01-01 (×23): 3.125 mg via ORAL
  Filled 2016-12-18 (×24): qty 1

## 2016-12-18 MED ORDER — SODIUM CHLORIDE 0.9 % IV SOLN: 250.0000 mL | INTRAVENOUS | Status: DC | PRN

## 2016-12-18 MED ORDER — CANGRELOR TETRASODIUM 50 MG IV SOLR
INTRAVENOUS | Status: AC
  Filled 2016-12-18: qty 50

## 2016-12-18 MED ORDER — AMLODIPINE BESYLATE 10 MG PO TABS: 10.0000 mg | ORAL_TABLET | Freq: Every day | ORAL | Status: DC

## 2016-12-18 MED ORDER — TIROFIBAN HCL IN NACL 5-0.9 MG/100ML-% IV SOLN
INTRAVENOUS | Status: DC | PRN
  Administered 2016-12-18: 0.075 ug/kg/min via INTRAVENOUS

## 2016-12-18 MED ORDER — FENTANYL CITRATE (PF) 100 MCG/2ML IJ SOLN
INTRAMUSCULAR | Status: AC
  Filled 2016-12-18: qty 2

## 2016-12-18 MED ORDER — LABETALOL HCL 5 MG/ML IV SOLN: 10.0000 mg | INTRAVENOUS | Status: DC | PRN

## 2016-12-18 MED ORDER — NITROGLYCERIN 0.4 MG SL SUBL: 0.4000 mg | SUBLINGUAL_TABLET | SUBLINGUAL | Status: DC | PRN

## 2016-12-18 MED ORDER — ONDANSETRON HCL 4 MG/2ML IJ SOLN
4.0000 mg | Freq: Four times a day (QID) | INTRAMUSCULAR | Status: DC | PRN
  Administered 2016-12-23 – 2016-12-26 (×2): 4 mg via INTRAVENOUS
  Filled 2016-12-18 (×3): qty 2

## 2016-12-18 MED ORDER — MORPHINE SULFATE (PF) 2 MG/ML IV SOLN
2.0000 mg | INTRAVENOUS | Status: DC | PRN
  Administered 2016-12-18 – 2016-12-19 (×2): 2 mg via INTRAVENOUS
  Administered 2016-12-19: 4 mg via INTRAVENOUS
  Administered 2016-12-19: 2 mg via INTRAVENOUS
  Filled 2016-12-18 (×2): qty 1
  Filled 2016-12-18 (×2): qty 2
  Filled 2016-12-18: qty 1

## 2016-12-18 MED ORDER — TRAMADOL HCL 50 MG PO TABS: 50.0000 mg | ORAL_TABLET | Freq: Four times a day (QID) | ORAL | Status: DC | PRN

## 2016-12-18 MED ORDER — LIDOCAINE HCL (PF) 1 % IJ SOLN
INTRAMUSCULAR | Status: AC
  Filled 2016-12-18: qty 30

## 2016-12-18 MED ORDER — ACETAMINOPHEN 325 MG PO TABS
650.0000 mg | ORAL_TABLET | ORAL | Status: DC | PRN
  Administered 2016-12-19: 650 mg via ORAL

## 2016-12-18 MED ORDER — TICAGRELOR 90 MG PO TABS
180.0000 mg | ORAL_TABLET | Freq: Once | ORAL | Status: AC
  Administered 2016-12-18: 180 mg via ORAL
  Filled 2016-12-18: qty 2

## 2016-12-18 MED ORDER — CALCIUM ACETATE (PHOS BINDER) 667 MG PO CAPS
667.0000 mg | ORAL_CAPSULE | Freq: Three times a day (TID) | ORAL | Status: DC
  Administered 2016-12-19 – 2016-12-21 (×9): 667 mg via ORAL
  Filled 2016-12-18 (×10): qty 1

## 2016-12-18 MED ORDER — HYDRALAZINE HCL 20 MG/ML IJ SOLN: 5.0000 mg | INTRAMUSCULAR | Status: DC | PRN

## 2016-12-18 MED ORDER — TICAGRELOR 90 MG PO TABS
90.0000 mg | ORAL_TABLET | Freq: Two times a day (BID) | ORAL | Status: DC
  Administered 2016-12-19 – 2017-01-01 (×27): 90 mg via ORAL
  Filled 2016-12-18 (×27): qty 1

## 2016-12-18 MED ORDER — HEPARIN SODIUM (PORCINE) 1000 UNIT/ML IJ SOLN
INTRAMUSCULAR | Status: AC
  Filled 2016-12-18: qty 1

## 2016-12-18 MED ORDER — NITROGLYCERIN 1 MG/10 ML FOR IR/CATH LAB
INTRA_ARTERIAL | Status: AC
  Filled 2016-12-18: qty 10

## 2016-12-18 MED ORDER — MIDAZOLAM HCL 2 MG/2ML IJ SOLN
INTRAMUSCULAR | Status: AC
  Filled 2016-12-18: qty 2

## 2016-12-18 MED ORDER — LOSARTAN POTASSIUM 50 MG PO TABS: 50.0000 mg | ORAL_TABLET | Freq: Every day | ORAL | Status: DC

## 2016-12-18 MED ORDER — ACETAMINOPHEN 325 MG PO TABS: 650.0000 mg | ORAL_TABLET | Freq: Four times a day (QID) | ORAL | Status: DC | PRN

## 2016-12-18 MED ORDER — ATORVASTATIN CALCIUM 80 MG PO TABS
80.0000 mg | ORAL_TABLET | Freq: Every day | ORAL | Status: DC
  Administered 2016-12-19 – 2016-12-31 (×11): 80 mg via ORAL
  Filled 2016-12-18 (×12): qty 1

## 2016-12-18 MED ORDER — ASPIRIN 81 MG PO CHEW
324.0000 mg | CHEWABLE_TABLET | Freq: Once | ORAL | Status: AC
  Administered 2016-12-18: 324 mg via ORAL

## 2016-12-18 MED ORDER — TIROFIBAN (AGGRASTAT) BOLUS VIA INFUSION: INTRAVENOUS | Status: DC | PRN

## 2016-12-18 MED ORDER — HEPARIN (PORCINE) IN NACL 2-0.9 UNIT/ML-% IJ SOLN
INTRAMUSCULAR | Status: AC
  Filled 2016-12-18: qty 1500

## 2016-12-18 MED ORDER — DM-GUAIFENESIN ER 30-600 MG PO TB12: 1.0000 | ORAL_TABLET | Freq: Every day | ORAL | Status: DC | PRN

## 2016-12-18 MED ORDER — DEXTROMETHORPHAN-GUAIFENESIN 10-200 MG PO CAPS: 1.0000 | ORAL_CAPSULE | Freq: Every day | ORAL | Status: DC | PRN

## 2016-12-18 MED ORDER — LEVOTHYROXINE SODIUM 112 MCG PO TABS
112.0000 ug | ORAL_TABLET | Freq: Every day | ORAL | Status: DC
  Administered 2016-12-19 – 2017-01-01 (×12): 112 ug via ORAL
  Filled 2016-12-18 (×13): qty 1

## 2016-12-18 MED ORDER — ALPRAZOLAM 0.25 MG PO TABS
0.2500 mg | ORAL_TABLET | Freq: Two times a day (BID) | ORAL | Status: DC | PRN
  Administered 2016-12-18 – 2016-12-19 (×2): 0.25 mg via ORAL
  Filled 2016-12-18 (×2): qty 1

## 2016-12-18 MED ORDER — TRAMADOL HCL 50 MG PO TABS
50.0000 mg | ORAL_TABLET | Freq: Once | ORAL | Status: AC
  Administered 2016-12-18: 50 mg via ORAL

## 2016-12-18 MED ORDER — HEPARIN (PORCINE) IN NACL 2-0.9 UNIT/ML-% IJ SOLN
INTRAMUSCULAR | Status: DC | PRN
  Administered 2016-12-18: 1500 mL via INTRA_ARTERIAL

## 2016-12-18 MED ORDER — SODIUM CHLORIDE 0.9% FLUSH
3.0000 mL | Freq: Two times a day (BID) | INTRAVENOUS | Status: DC
  Administered 2016-12-18 – 2016-12-22 (×8): 3 mL via INTRAVENOUS

## 2016-12-18 MED ORDER — FENTANYL CITRATE (PF) 100 MCG/2ML IJ SOLN
INTRAMUSCULAR | Status: DC | PRN
  Administered 2016-12-18: 25 ug via INTRAVENOUS

## 2016-12-18 MED ORDER — ASPIRIN 81 MG PO CHEW
CHEWABLE_TABLET | ORAL | Status: AC
  Administered 2016-12-18: 324 mg via ORAL
  Filled 2016-12-18: qty 4

## 2016-12-18 MED ORDER — TIROFIBAN HCL IN NACL 5-0.9 MG/100ML-% IV SOLN: 0.0750 ug/kg/min | INTRAVENOUS | Status: DC

## 2016-12-18 MED ORDER — ZOLPIDEM TARTRATE 5 MG PO TABS: 5.0000 mg | ORAL_TABLET | Freq: Every evening | ORAL | Status: DC | PRN

## 2016-12-18 MED ORDER — NITROGLYCERIN IN D5W 200-5 MCG/ML-% IV SOLN: 0.0000 ug/min | INTRAVENOUS | Status: DC

## 2016-12-18 MED ORDER — GABAPENTIN 300 MG PO CAPS
300.0000 mg | ORAL_CAPSULE | Freq: Two times a day (BID) | ORAL | Status: DC
  Administered 2016-12-18 – 2016-12-19 (×3): 300 mg via ORAL
  Filled 2016-12-18 (×4): qty 1

## 2016-12-18 MED ORDER — ASPIRIN 81 MG PO CHEW
81.0000 mg | CHEWABLE_TABLET | Freq: Every day | ORAL | Status: DC
  Administered 2016-12-19 – 2017-01-01 (×13): 81 mg via ORAL
  Filled 2016-12-18 (×14): qty 1

## 2016-12-18 MED ORDER — TICAGRELOR 90 MG PO TABS
ORAL_TABLET | ORAL | Status: AC
  Filled 2016-12-18: qty 2

## 2016-12-18 MED ORDER — IOPAMIDOL (ISOVUE-370) INJECTION 76%
INTRAVENOUS | Status: DC | PRN
  Administered 2016-12-18: 130 mL via INTRA_ARTERIAL

## 2016-12-18 SURGICAL SUPPLY — 25 items
BALLN MOZEC 2.0X12 (BALLOONS) ×3
BALLN ~~LOC~~ EUPHORA RX 3.75X15 (BALLOONS) ×3
BALLOON MOZEC 2.0X12 (BALLOONS) IMPLANT
BALLOON ~~LOC~~ EUPHORA RX 3.75X15 (BALLOONS) IMPLANT
CATH EXPO 5F MPA-1 (CATHETERS) ×2 IMPLANT
CATH INFINITI 5 FR 3DRC (CATHETERS) ×2 IMPLANT
CATH INFINITI 5FR MULTPACK ANG (CATHETERS) ×2 IMPLANT
CATH VISTA GUIDE 6FR MPA1 (CATHETERS) ×4 IMPLANT
DEVICE CLOSURE PERCLS PRGLD 6F (VASCULAR PRODUCTS) IMPLANT
FEM STOP ARCH (HEMOSTASIS) ×2
KIT ENCORE 26 ADVANTAGE (KITS) ×2 IMPLANT
KIT HEART LEFT (KITS) ×3 IMPLANT
PACK CARDIAC CATHETERIZATION (CUSTOM PROCEDURE TRAY) ×3 IMPLANT
PERCLOSE PROGLIDE 6F (VASCULAR PRODUCTS) ×6
PINNACLE LONG 6F 25CM (SHEATH) ×3
SHEATH INTRO PINNACLE 6F 25CM (SHEATH) IMPLANT
SHEATH PINNACLE 5F 10CM (SHEATH) ×2 IMPLANT
SHEATH PINNACLE 6F 10CM (SHEATH) ×2 IMPLANT
STENT SYNERGY DES 3.5X20 (Permanent Stent) ×2 IMPLANT
SYR MEDRAD MARK V 150ML (SYRINGE) ×3 IMPLANT
SYSTEM COMPRESSION FEMOSTOP (HEMOSTASIS) IMPLANT
TRANSDUCER W/STOPCOCK (MISCELLANEOUS) ×3 IMPLANT
TUBING CIL FLEX 10 FLL-RA (TUBING) ×3 IMPLANT
WIRE COUGAR XT STRL 190CM (WIRE) ×2 IMPLANT
WIRE EMERALD 3MM-J .035X150CM (WIRE) ×2 IMPLANT

## 2016-12-18 NOTE — H&P (Addendum)
CARDIOLOGY CONSULT NOTE   Patient ID: Jill Johnson MRN: [id number] DOB/AGE: 1941-05-23 76 y.o.  Admit date: 12/26/2016  Primary Physician   Hollice Espy, MD Primary Cardiologist   Dr Anne Fu 03/02/2016 Reason for Consultation   Chest pain Requesting MD: Dr Myra Gianotti  Jill Johnson is a 76 y.o. year old female with a history of 21mm bioprosthetic AVR/CABG 01/2010 w/LIMA to LAD, SVG to PDA, SVG to Diag 1. She has not had a cath or stress test since her surgery; HLD w/ Crestor intolerance, ESRD s/p fistula, HTN, hypothyroid, TIA/CVA.   Seen during admission for SOB>>started on HD>>volume management by HD since then. She had mildly elevated troponin's during that admission, plan was for outpt Myoview. Pt went to [facility] facility after d/c, has not yet followed up with cardiology.   She was discharged from Rehab to her home on 11/23/2016.  Since d/c, she has done very little. She has significant RLE pain and DOE. When she walks 25 steps or so, she gets SOB. She has spent more time in a wheelchair since being at home. Her family has arranged for admission to Assisted Living at [facility] on 02/12. They are staying with her till then. Her memory has been deteriorating, faster of late.  She has consistently complained of foot pain. She has slept in a chair since d/c and has fallen out of the chair multiple times. She states she sleeps in the chair because her foot hurts and she has to have it on the ground to be able to sleep at all. She also has told her son she can't breathe lying down, but is doing ok in the hospital today.  Pt was seen by Dr Arbie Cookey 12/15/2016, had critical ischemia L foot. Arteriography ASAP recommended. Arranged for 12/25/2016.    Today, pt was being prepped for Abd angio and was complaining of chest pain. Cards asked to see.  Jill Johnson states the chest pain has resolved, cannot rate it or say how long it lasted. She says the last  episode was on the day of admission in December. She has been SOB w/ exertion, but denies chest pain since discharge from rehab.        Past Medical History:  Diagnosis Date  . Anemia   . CAD (coronary artery disease)   . CFS (chronic fatigue syndrome)   . Chronic rhinitis   . Complication of anesthesia    "a little hard waking up"  . Constipation    due to iron  . Coronary atherosclerosis   . Critical lower limb ischemia 12/19/2016  . Depression   . Difficult intubation    "I have a small opening" When she had her CABG   . DJD (degenerative joint disease)   . ESRD on hemodialysis (HCC) 12/29/2016  . Hyperlipidemia   . Hypertension   . Hypothyroidism   . Kidney stone   . MDD (major depressive disorder)   . Neuropathy (HCC)    in legs  . Obesity   . Osteopenia   . PAD (peripheral artery disease) (HCC) 12/11/2016  . Renal insufficiency    Post Op  . Stroke [facility])    TIA, several          Past Surgical History:  Procedure Laterality Date  . ABDOMINAL HYSTERECTOMY  1976  . AORTIC VALVE REPLACEMENT    . AV FISTULA PLACEMENT Left 08/07/2016   Procedure: CREATION OF  LEFT UPPER ARM ARTERIOVENOUS (AV) FISTULA;  Surgeon:  Larina Earthlyodd F Early, MD;  [facility];  Service: Vascular;  Laterality: Left;  . BACK SURGERY  1980's  . CARDIAC VALVE REPLACEMENT    . CORONARY ARTERY BYPASS GRAFT    . KNEE ARTHROSCOPY           Allergies  Allergen Reactions  . Prednisone Other (See Comments)    Makes her loony    I have reviewed the patient's current medications . sodium chloride              Prior to Admission medications   Medication Sig Start Date End Date Taking? Authorizing Provider  acetaminophen (TYLENOL) 325 MG tablet Take 2 tablets (650 mg total) by mouth every 6 (six) hours as needed for mild pain (or Fever >/= 101). 11/07/16  Yes Lenox PondsEdwin Silva Zapata, MD  amLODipine (NORVASC) 10 MG tablet Take 1 tablet (10 mg total) by mouth  daily. 05/10/14  Yes Jake BatheMark C Skains, MD  aspirin 81 MG tablet Take 1 tablet (81 mg total) by mouth daily. 11/07/16  Yes Lenox PondsEdwin Silva Zapata, MD  calcium acetate (PHOSLO) 667 MG capsule Take 667 mg by mouth 3 (three) times daily with meals.    Yes Historical Provider, MD  cholecalciferol (VITAMIN D) 1000 units tablet Take 2,000 Units by mouth daily.    Yes Historical Provider, MD  Cyanocobalamin (VITAMIN B-12 CR) 1500 MCG TBCR Take 1,500 mcg by mouth daily.   Yes Historical Provider, MD  gabapentin (NEURONTIN) 300 MG capsule Take 300 mg by mouth 2 (two) times daily.  03/27/16  Yes Historical Provider, MD  levothyroxine (SYNTHROID, LEVOTHROID) 112 MCG tablet Take 112 mcg by mouth daily before breakfast.  12/11/15  Yes Historical Provider, MD  LORazepam (ATIVAN) 0.5 MG tablet Take 0.5 mg by mouth 2 (two) times daily as needed. Anxiety/pain. 11/23/16  Yes Historical Provider, MD  losartan (COZAAR) 50 MG tablet Take 1 tablet (50 mg total) by mouth at bedtime. 11/07/16  Yes Lenox PondsEdwin Silva Zapata, MD  senna-docusate (SENOKOT-S) 8.6-50 MG tablet Take 1 tablet by mouth at bedtime as needed for mild constipation. 11/07/16  Yes Lenox PondsEdwin Silva Zapata, MD  traMADol (ULTRAM) 50 MG tablet Take 50 mg by mouth 4 (four) times daily as needed for pain. 11/23/16  Yes Historical Provider, MD  Darbepoetin Alfa (ARANESP) 150 MCG/0.3ML SOSY injection Inject 0.3 mLs (150 mcg total) into the vein every Wednesday with hemodialysis. Patient not taking: Reported on 12/16/2016 11/11/16   Lenox PondsEdwin Silva Zapata, MD  Dextromethorphan-Guaifenesin (CORICIDIN HBP CONGESTION/COUGH) 10-200 MG CAPS Take 1 capsule by mouth daily as needed (cold).    Historical Provider, MD     Social History        Social History  . Marital status: Divorced    Spouse name: N/A  . Number of children: N/A  . Years of education: N/A       Occupational History  . RETIRED NURSE        Social History Main Topics  . Smoking status: Never Smoker   . Smokeless tobacco: Never Used  . Alcohol use No  . Drug use: No  . Sexual activity: Not on file       Other Topics Concern  . Not on file      Social History Narrative   Pt has health care POA, her sons.        Family Status  Relation Status  . Mother Deceased  . Father Deceased  . Son  Family History  Problem Relation Age of Onset  . Alzheimer's disease Mother   . Aortic stenosis Father   . Heart disease Father   . Heart disease Son      ROS:  Full 14 point review of systems complete and found to be negative unless listed above.  Physical Exam: Blood pressure (!) 139/40, pulse 76, temperature 98.9 F (37.2 C), temperature source Oral, resp. rate 18, height 5' (1.524 m), weight 158 lb (71.7 kg), SpO2 98 %.  General: Well developed, well nourished, female in no acute distress Head: Eyes PERRLA, No xanthomas.   Normocephalic and atraumatic, oropharynx without edema or exudate. Dentition: poor Lungs: rales bases Heart: HRRR S1 S2, no rub/gallop, 3/6 murmur. pulses are 2+ all 2/4 extrem. No distal pulses in LLE, LUE w/ AV fistula Neck: No carotid bruits. No lymphadenopathy.  JVD not elevated Abdomen: Bowel sounds present, abdomen soft and non-tender without masses or hernias noted. Msk:  No spine or cva tenderness. No weakness, no joint deformities or effusions. Extremities: No clubbing or cyanosis. No edema.  Neuro: Alert and oriented X 2. No focal deficits noted. Psych:  Good affect, responds appropriately Skin: No rashes or lesions noted. L foot bandaged and dressed, no drainage, not disturbed  Labs:   Recent Labs       Lab Results  Component Value Date   WBC 10.5 11/07/2016   HGB 8.5 (L) 01/04/2017   HCT 25.0 (L) 12/26/2016   MCV 96.0 11/07/2016   PLT 229 11/07/2016       Last Labs    Recent Labs Lab 12/31/2016 1230  NA 135  K 3.4*  CL 98*  BUN 28*  CREATININE 3.50*  GLUCOSE 98     Last Labs       Magnesium  Date  Value Ref Range Status  10/29/2016 2.3 1.7 - 2.4 mg/dL Final     Last Labs       B Natriuretic Peptide  Date/Time Value Ref Range Status  10/28/2016 03:45 PM 1,384.6 (H) 0.0 - 100.0 pg/mL Final     Last Labs         Ferritin  Date/Time Value Ref Range Status  10/20/2016 09:29 AM 387 (H) 11 - 307 ng/mL Final    Comment:    Performed at [facility]        TIBC  Date/Time Value Ref Range Status  10/20/2016 09:29 AM 354 250 - 450 ug/dL Final        Iron  Date/Time Value Ref Range Status  10/20/2016 09:29 AM 49 28 - 170 ug/dL Final      Echo: 40/98/1191 - Left ventricle: The cavity size was normal. Wall thickness was increased in a pattern of moderate LVH. Systolic function was normal. The estimated ejection fraction was in the range of 60% to 65%. Wall motion was normal; there were no regional wall motion abnormalities. Features are consistent with a pseudonormal left ventricular filling pattern, with concomitant abnormal relaxation and increased filling pressure (grade 2 diastolic dysfunction). - Aortic valve: Bioprosthesis in aortic position. There was trivial regurgitation. Mean gradient (S): 29 mm Hg. Peak gradient (S): 51 mm Hg. VTI ratio of LVOT to aortic valve: 0.4. - Mitral valve: Calcified annulus. Mildly thickened leaflets . There was mild to moderate regurgitation. - Left atrium: The atrium was severely dilated. - Tricuspid valve: There was trivial regurgitation. - Pulmonary arteries: Systolic pressure could not be accurately estimated. - Pericardium, extracardiac: There was no pericardial effusion. Impressions: -  Moderate LVH with LVEF 60-65% and grade 2 diastolic dysfunction. Severe left atrial enlargement. Moderately calcified mitral annulus with thickened leaflets and mild to moderate mitral regurgitation. 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 Trivial aortic regurgitation noted. Gradients have  increased compared to the prior study with mean gradient 29 mmHg, although the dimensionless index does not suggest prosthetic valve stenosis. Trivial tricuspid regurgitation, unable to assess PASP.  ECG:  With CP 12/19/2015 - SR, QRS duration 124 Jill (was 116 Jill 10/2016) and inferolateral T wave changes from 10/2016.   Cath: none since CABG 2011  Radiology:   Imaging Results (Last 48 hours)  No results found.    ASSESSMENT AND PLAN:   The patient was seen today by Dr Tresa Endo, the patient evaluated and the data reviewed.  Principal Problem: 1.  Unstable angina (HCC) - Her ECG has changes, MD to review. - admit to cardiology - She currently denies chest pain - s/p CABG 2011 w/ no cath or MV - review data with MD, may need cath - if not today, add heparin, nitro paste, plan cath Monday  Active Problems: 2.  Critical limb ischemia  - per Dr Arbie Cookey and Dr Myra Gianotti - perform LE angio when pt otherwise stable  3.  S/P AVR (aortic valve replacement) - see echo 10/2016, mean gradient 29 mm Hg  4.  Anemia, chronic disease - Hgb 8.5 today - this is the highest it has been since 07/2016 - follow, management per Nephrology  5.  ESRD on hemodialysis (HCC) - volume management with HD per Nephrology   Code status: Clarified with her son. She wants everything done but does not want to live on a vent. Full code for now.  SignedLeanna Battles 12/26/2016 3:03 PM Beeper [phone]  Co-Sign MD  Patient seen and examined. Agree with assessment and plan.  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  By Dr. Chales Abrahams.  In 2011.  She was found to have multivessel CAD and aortic stenosis and underwent CABG revascularization surgery 3 with a LIMA to LAD, SVG to PDA, and SVG to the first diagonal vessel.  She underwent aortic valve replacement with a #21 mm bioprosthetic valve.  She has not had a nuclear stress test or cardiac catheterization since her surgery.   She has a history of hyper tension, hyperlipidemia, remote TIA/CVA, and has developed progressive end-stage renal disease and initiated dialysis last year.  The past several weeks, she has experienced episodes of chest pressure.  She has been under increased emotional stress in that she will be leaving her house and going to assisted living.  Her husband passed away many years ago and she has been living by herself since his death.  She is developed progressive nonhealing ulcer to her left lower extremity and was seen by Dr. Arbie Cookey this week.  She is felt to have critical limb ischemia of her left foot and was scheduled to undergo arteriography of her lower extremity today.  In the short stay unit, she had initial ECG, which showed normal sinus rhythm without significant ST-T changes.  She then experienced an episode of chest pressure.  ECG with this episode was associated with development of inferolateral ST segment depression suggesting ischemia.  A subsequent ECG several hours later showed progression of her ST segment depression worrisome for dynamic ischemic changes.  Presently she is chest pain-free and a repeat EKG did show some slight improvement from her more abnormal ECG, but she continues to have inferolateral  ST depression.  Pressure stable 139/50, pulse 80, respirations 16.  HEENT is unremarkable.  Mallampati scale  3.  She did not have JVD.  Her lungs were relatively clear.  Rhythm was regular with a 2/6 murmur in her aortic region.  There is no S3 gallop.  Her abdomen was soft, nontender.  She had erythema of both extremities, particularly worse on the left with tense edema on the left and a nonhealing ulceration over the medial aspect of her left calf with poor granulation ACE and marked edema and dependent rubor over the entire left foot.  With her dynamic EKG changes, her lower extremity arteriography has been canceled.  We were asked to see her in consultation and will now change her to an  admission.  I have recommended definitive cardiac catheterization today in light of her dynamic ECG changes.  Will need to be done through right groin access since she has an AV fistula in her left arm.  Risk benefits of the procedure were discussed with the patient and she agrees to proceed for the catheterization study today.   Jill Bihari, MD, [facility] 12/31/2016 3:37 PM

## 2016-12-18 NOTE — Consult Note (Addendum)
CARDIOLOGY CONSULT NOTE   Patient ID: Jill Johnson MRN: [id number] DOB/AGE: Apr 13, 1941 76 y.o.  Admit date: 12/17/2016  Primary Physician   Hollice Espy, MD Primary Cardiologist   Dr Anne Fu 03/02/2016 Reason for Consultation   Chest pain Requesting MD: Dr Myra Gianotti  [id number] L Johnson is a 76 y.o. year old female with a history of 21mm bioprosthetic AVR/CABG 01/2010 w/ LIMA to LAD, SVG to PDA, SVG to Diag 1. She has not had a cath or stress test since her surgery; HLD w/ Crestor intolerance, ESRD s/p fistula, HTN, hypothyroid, TIA/CVA.   Seen during admission for SOB>>started on HD>>volume management by HD since then. She had mildly elevated troponin's during that admission, plan was for outpt Myoview. Pt went to [facility] facility after d/c, has not yet followed up with cardiology.   She was discharged from Rehab to her home on 11/23/2016.  Since d/c, she has done very little. She has significant RLE pain and DOE. When she walks 25 steps or so, she gets SOB. She has spent more time in a wheelchair since being at home. Her family has arranged for admission to Assisted Living at [facility] on 02/12. They are staying with her till then. Her memory has been deteriorating, faster of late.  She has consistently complained of foot pain. She has slept in a chair since d/c and has fallen out of the chair multiple times. She states she sleeps in the chair because her foot hurts and she has to have it on the ground to be able to sleep at all. She also has told her son she can't breathe lying down, but is doing ok in the hospital today.  Pt was seen by Dr Arbie Cookey 12/15/2016, had critical ischemia L foot. Arteriography ASAP recommended. Arranged for 01/01/2017.    Today, pt was being prepped for Abd angio and was complaining of chest pain. Cards asked to see.  Jill Johnson states the chest pain has resolved, cannot rate it or say how long it lasted. She says the last episode was on  the day of admission in December. She has been SOB w/ exertion, but denies chest pain since discharge from rehab.    Past Medical History:  Diagnosis Date  . Anemia   . CAD (coronary artery disease)   . CFS (chronic fatigue syndrome)   . Chronic rhinitis   . Complication of anesthesia    "a little hard waking up"  . Constipation    due to iron  . Coronary atherosclerosis   . Critical lower limb ischemia 12/11/2016  . Depression   . Difficult intubation    "I have a small opening" When she had her CABG   . DJD (degenerative joint disease)   . ESRD on hemodialysis (HCC) 12/19/2016  . Hyperlipidemia   . Hypertension   . Hypothyroidism   . Kidney stone   . MDD (major depressive disorder)   . Neuropathy (HCC)    in legs  . Obesity   . Osteopenia   . PAD (peripheral artery disease) (HCC) 12/27/2016  . Renal insufficiency    Post Op  . Stroke [facility])    TIA, several     Past Surgical History:  Procedure Laterality Date  . ABDOMINAL HYSTERECTOMY  1976  . AORTIC VALVE REPLACEMENT    . AV FISTULA PLACEMENT Left 08/07/2016   Procedure: CREATION OF  LEFT UPPER ARM ARTERIOVENOUS (AV) FISTULA;  Surgeon: Larina Earthly, MD;  Location: [facility]  OR;  Service: Vascular;  Laterality: Left;  . BACK SURGERY  1980's  . CARDIAC VALVE REPLACEMENT    . CORONARY ARTERY BYPASS GRAFT    . KNEE ARTHROSCOPY      Allergies  Allergen Reactions  . Prednisone Other (See Comments)    Makes her loony    I have reviewed the patient's current medications  . sodium chloride       Prior to Admission medications   Medication Sig Start Date End Date Taking? Authorizing Provider  acetaminophen (TYLENOL) 325 MG tablet Take 2 tablets (650 mg total) by mouth every 6 (six) hours as needed for mild pain (or Fever >/= 101). 11/07/16  Yes Lenox Ponds, MD  amLODipine (NORVASC) 10 MG tablet Take 1 tablet (10 mg total) by mouth daily. 05/10/14  Yes [name], MD  aspirin 81 MG tablet Take 1 tablet (81 mg total)  by mouth daily. 11/07/16  Yes Lenox Ponds, MD  calcium acetate (PHOSLO) 667 MG capsule Take 667 mg by mouth 3 (three) times daily with meals.    Yes Historical Provider, MD  cholecalciferol (VITAMIN D) 1000 units tablet Take 2,000 Units by mouth daily.    Yes Historical Provider, MD  Cyanocobalamin (VITAMIN B-12 CR) 1500 MCG TBCR Take 1,500 mcg by mouth daily.   Yes Historical Provider, MD  gabapentin (NEURONTIN) 300 MG capsule Take 300 mg by mouth 2 (two) times daily.  03/27/16  Yes Historical Provider, MD  levothyroxine (SYNTHROID, LEVOTHROID) 112 MCG tablet Take 112 mcg by mouth daily before breakfast.  12/11/15  Yes Historical Provider, MD  LORazepam (ATIVAN) 0.5 MG tablet Take 0.5 mg by mouth 2 (two) times daily as needed. Anxiety/pain. 11/23/16  Yes Historical Provider, MD  losartan (COZAAR) 50 MG tablet Take 1 tablet (50 mg total) by mouth at bedtime. 11/07/16  Yes Lenox Ponds, MD  senna-docusate (SENOKOT-S) 8.6-50 MG tablet Take 1 tablet by mouth at bedtime as needed for mild constipation. 11/07/16  Yes Lenox Ponds, MD  traMADol (ULTRAM) 50 MG tablet Take 50 mg by mouth 4 (four) times daily as needed for pain. 11/23/16  Yes Historical Provider, MD  Darbepoetin Alfa (ARANESP) 150 MCG/0.3ML SOSY injection Inject 0.3 mLs (150 mcg total) into the vein every Wednesday with hemodialysis. Patient not taking: Reported on 12/16/2016 11/11/16   Lenox Ponds, MD  Dextromethorphan-Guaifenesin (CORICIDIN HBP CONGESTION/COUGH) 10-200 MG CAPS Take 1 capsule by mouth daily as needed (cold).    Historical Provider, MD     Social History   Social History  . Marital status: Divorced    Spouse name: N/A  . Number of children: N/A  . Years of education: N/A   Occupational History  . RETIRED NURSE    Social History Main Topics  . Smoking status: Never Smoker  . Smokeless tobacco: Never Used  . Alcohol use No  . Drug use: No  . Sexual activity: Not on file   Other Topics Concern    . Not on file   Social History Narrative   Pt has health care POA, her sons.    Family Status  Relation Status  . Mother Deceased  . Father Deceased  . Son    Family History  Problem Relation Age of Onset  . Alzheimer's disease Mother   . Aortic stenosis Father   . Heart disease Father   . Heart disease Son      ROS:  Full 14 point review of systems complete and  found to be negative unless listed above.  Physical Exam: Blood pressure (!) 139/40, pulse 76, temperature 98.9 F (37.2 C), temperature source Oral, resp. rate 18, height 5' (1.524 m), weight 158 lb (71.7 kg), SpO2 98 %.  General: Well developed, well nourished, female in no acute distress Head: Eyes PERRLA, No xanthomas.   Normocephalic and atraumatic, oropharynx without edema or exudate. Dentition: poor Lungs: rales bases Heart: HRRR S1 S2, no rub/gallop, 3/6 murmur. pulses are 2+ all 2/4 extrem. No distal pulses in LLE, LUE w/ AV fistula Neck: No carotid bruits. No lymphadenopathy.  JVD not elevated Abdomen: Bowel sounds present, abdomen soft and non-tender without masses or hernias noted. Msk:  No spine or cva tenderness. No weakness, no joint deformities or effusions. Extremities: No clubbing or cyanosis. No edema.  Neuro: Alert and oriented X 2. No focal deficits noted. Psych:  Good affect, responds appropriately Skin: No rashes or lesions noted. L foot bandaged and dressed, no drainage, not disturbed  Labs:   Lab Results  Component Value Date   WBC 10.5 11/07/2016   HGB 8.5 (L) 12/12/2016   HCT 25.0 (L) 12/11/2016   MCV 96.0 11/07/2016   PLT 229 11/07/2016     Recent Labs Lab 12/22/2016 1230  NA 135  Jill 3.4*  CL 98*  BUN 28*  CREATININE 3.50*  GLUCOSE 98   Magnesium  Date Value Ref Range Status  10/29/2016 2.3 1.7 - 2.4 mg/dL Final   B Natriuretic Peptide  Date/Time Value Ref Range Status  10/28/2016 03:45 PM 1,384.6 (H) 0.0 - 100.0 pg/mL Final   Ferritin  Date/Time Value Ref Range  Status  10/20/2016 09:29 AM 387 (H) 11 - 307 ng/mL Final    Comment:    [facility]   TIBC  Date/Time Value Ref Range Status  10/20/2016 09:29 AM 354 250 - 450 ug/dL Final   Iron  Date/Time Value Ref Range Status  10/20/2016 09:29 AM 49 28 - 170 ug/dL Final    Echo: 16/10/960412/23/2017 - Left ventricle: The cavity size was normal. Wall thickness was   increased in a pattern of moderate LVH. Systolic function was   normal. The estimated ejection fraction was in the range of 60%   to 65%. Wall motion was normal; there were no regional wall   motion abnormalities. Features are consistent with a pseudonormal   left ventricular filling pattern, with concomitant abnormal   relaxation and increased filling pressure (grade 2 diastolic   dysfunction). - Aortic valve: Bioprosthesis in aortic position. There was trivial   regurgitation. Mean gradient (S): 29 mm Hg. Peak gradient (S): 51   mm Hg. VTI ratio of LVOT to aortic valve: 0.4. - Mitral valve: Calcified annulus. Mildly thickened leaflets .   There was mild to moderate regurgitation. - Left atrium: The atrium was severely dilated. - Tricuspid valve: There was trivial regurgitation. - Pulmonary arteries: Systolic pressure could not be accurately   estimated. - Pericardium, extracardiac: There was no pericardial effusion. Impressions: - Moderate LVH with LVEF 60-65% and grade 2 diastolic dysfunction.   Severe left atrial enlargement. Moderately calcified mitral   annulus with thickened leaflets and mild to moderate mitral   regurgitation. 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   Trivial aortic regurgitation noted. Gradients have increased   compared to the prior study with mean gradient 29 mmHg, although   the dimensionless index does not suggest prosthetic valve   stenosis. Trivial tricuspid regurgitation, unable to assess PASP.  ECG:  With CP 12/19/2015 - SR, QRS duration 124 Jill (was 116 Jill 10/2016) and  inferolateral T wave changes from 10/2016.   Cath: none since CABG 2011  Radiology:  No results found.  ASSESSMENT AND PLAN:   The patient was seen today by Dr Tresa Endo, the patient evaluated and the data reviewed.  Principal Problem: 1.  Unstable angina (HCC) - Her ECG has changes, MD to review. - She currently denies chest pain - s/p CABG 2011 w/ no cath or MV - review data with MD, may need cath - if not today, add heparin, nitro paste, plan cath Monday  Active Problems: 2.  Critical limb ischemia  - per Dr Arbie Cookey and Dr Myra Gianotti - perform LE angio when pt otherwise stable  3.  S/P AVR (aortic valve replacement) - see echo 10/2016, mean gradient 29 mm Hg  4.  Anemia, chronic disease - Hgb 8.5 today - this is the highest it has been since 07/2016 - follow, management per Nephrology  5.  ESRD on hemodialysis (HCC) - volume management with HD per Nephrology   Code status: Clarified with her son. She wants everything done but does not want to live on a vent. Full code for now.  SignedLeanna Battles 12/29/2016 3:03 PM Beeper [phone]  Co-Sign MD   Patient seen and examined. Agree with assessment and plan.  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  By Dr. Chales Abrahams.  In 2011.  She was found to have multivessel CAD and aortic stenosis and underwent CABG revascularization surgery 3 with a LIMA to LAD, SVG to PDA, and SVG to the first diagonal vessel.  She underwent aortic valve replacement with a #21 mm bioprosthetic valve.  She has not had a nuclear stress test or cardiac catheterization since her surgery.  She has a history of hyper tension, hyperlipidemia, remote TIA/CVA, and has developed progressive end-stage renal disease and initiated dialysis last year.  The past several weeks, she has experienced episodes of chest pressure.  She has been under increased emotional stress in that she will be leaving her house and going to assisted living.  Her husband  passed away many years ago and she has been living by herself since his death.  She is developed progressive nonhealing ulcer to her left lower extremity and was seen by Dr. Arbie Cookey this week.  She is felt to have critical limb ischemia of her left foot and was scheduled to undergo arteriography of her lower extremity today.  In the short stay unit, she had initial ECG, which showed normal sinus rhythm without significant ST-T changes.  She then experienced an episode of chest pressure.  ECG with this episode was associated with development of inferolateral ST segment depression suggesting ischemia.  A subsequent ECG several hours later showed progression of her ST segment depression worrisome for dynamic ischemic changes.  Presently she is chest pain-free and a repeat EKG did show some slight improvement from her more abnormal ECG, but she continues to have inferolateral ST depression.  Pressure stable 139/50, pulse 80, respirations 16.  HEENT is unremarkable.  Mallampati scale  3.  She did not have JVD.  Her lungs were relatively clear.  Rhythm was regular with a 2/6 murmur in her aortic region.  There is no S3 gallop.  Her abdomen was soft, nontender.  She had erythema of both extremities, particularly worse on the left with tense edema on the left and a nonhealing ulceration over the medial  aspect of her left calf with poor granulation ACE and marked edema and dependent rubor over the entire left foot.  With her dynamic EKG changes, her lower extremity arteriography has been canceled.  We were asked to see her in consultation and will now change her to an admission.  I have recommended definitive cardiac catheterization today in light of her dynamic ECG changes.  Will need to be done through right groin access since she has an AV fistula in her left arm.  Risk benefits of the procedure were discussed with the patient and she agrees to proceed for the catheterization study today.   Lennette Bihari, MD,  [facility] 12/27/2016 3:37 PM  .

## 2016-12-18 NOTE — Interval H&P Note (Signed)
Cath Lab Visit (complete for each Cath Lab visit)  Clinical Evaluation Leading to the Procedure:   ACS: Yes.    Non-ACS:    Anginal Classification: CCS IV  Anti-ischemic medical therapy: Minimal Therapy (1 class of medications)  Non-Invasive Test Results: No non-invasive testing performed  Prior CABG: Previous CABG      History and Physical Interval Note:  12/19/2016 7:44 PM  Jill Johnson  has presented today for surgery, with the diagnosis of cp  The various methods of treatment have been discussed with the patient and family. After consideration of risks, benefits and other options for treatment, the patient has consented to  Procedure(s): Left Heart Cath and Cors/Grafts Angiography (N/A) Coronary Stent Intervention (N/A) as a surgical intervention .  The patient's history has been reviewed, patient examined, no change in status, stable for surgery.  I have reviewed the patient's chart and labs.  Questions were answered to the patient's satisfaction.     Tonny Bollmanooper, Ingrid Shifrin

## 2016-12-18 NOTE — Progress Notes (Signed)
While preparing patient for her angiogram, she began to c/o of chest tightness.  O2 at 2 lpm applied and stat EKG obtained. Tightness resolved prior to completion of EKG. Changes were noted from previous EKG from 10/2016.  Dr Myra GianottiBrabham notified and cardiology notified.  PA to see pt.  Vital signs have remained at baseline.

## 2016-12-19 ENCOUNTER — Encounter (HOSPITAL_COMMUNITY): Payer: Self-pay | Admitting: *Deleted

## 2016-12-19 ENCOUNTER — Inpatient Hospital Stay (HOSPITAL_COMMUNITY): Payer: Medicare HMO

## 2016-12-19 DIAGNOSIS — I25110 Atherosclerotic heart disease of native coronary artery with unstable angina pectoris: Secondary | ICD-10-CM

## 2016-12-19 DIAGNOSIS — Z952 Presence of prosthetic heart valve: Secondary | ICD-10-CM

## 2016-12-19 DIAGNOSIS — Z992 Dependence on renal dialysis: Secondary | ICD-10-CM

## 2016-12-19 DIAGNOSIS — D638 Anemia in other chronic diseases classified elsewhere: Secondary | ICD-10-CM

## 2016-12-19 DIAGNOSIS — I214 Non-ST elevation (NSTEMI) myocardial infarction: Secondary | ICD-10-CM

## 2016-12-19 DIAGNOSIS — N186 End stage renal disease: Secondary | ICD-10-CM

## 2016-12-19 DIAGNOSIS — I998 Other disorder of circulatory system: Secondary | ICD-10-CM

## 2016-12-19 LAB — BASIC METABOLIC PANEL
ANION GAP: 12 (ref 5–15)
BUN: 33 mg/dL — ABNORMAL HIGH (ref 6–20)
CALCIUM: 7.9 mg/dL — AB (ref 8.9–10.3)
CO2: 22 mmol/L (ref 22–32)
CREATININE: 4.11 mg/dL — AB (ref 0.44–1.00)
Chloride: 101 mmol/L (ref 101–111)
GFR, EST AFRICAN AMERICAN: 11 mL/min — AB (ref >60)
GFR, EST NON AFRICAN AMERICAN: 10 mL/min — AB (ref >60)
GLUCOSE: 72 mg/dL (ref 65–99)
Potassium: 3.9 mmol/L (ref 3.5–5.1)
Sodium: 135 mmol/L (ref 135–145)

## 2016-12-19 LAB — CBC
HCT: 21.9 % — ABNORMAL LOW (ref 36.0–46.0)
HEMOGLOBIN: 7.2 g/dL — AB (ref 12.0–15.0)
MCH: 32.9 pg (ref 26.0–34.0)
MCHC: 32.9 g/dL (ref 30.0–36.0)
MCV: 100 fL (ref 78.0–100.0)
PLATELETS: 271 10*3/uL (ref 150–400)
RBC: 2.19 MIL/uL — ABNORMAL LOW (ref 3.87–5.11)
RDW: 16.2 % — ABNORMAL HIGH (ref 11.5–15.5)
WBC: 13.3 10*3/uL — ABNORMAL HIGH (ref 4.0–10.5)

## 2016-12-19 LAB — COMPREHENSIVE METABOLIC PANEL
ALBUMIN: 2.4 g/dL — AB (ref 3.5–5.0)
ALK PHOS: 59 U/L (ref 38–126)
ALT: 17 U/L (ref 14–54)
ANION GAP: 13 (ref 5–15)
AST: 37 U/L (ref 15–41)
BILIRUBIN TOTAL: 0.6 mg/dL (ref 0.3–1.2)
BUN: 30 mg/dL — ABNORMAL HIGH (ref 6–20)
CALCIUM: 8.1 mg/dL — AB (ref 8.9–10.3)
CO2: 26 mmol/L (ref 22–32)
Chloride: 98 mmol/L — ABNORMAL LOW (ref 101–111)
Creatinine, Ser: 3.76 mg/dL — ABNORMAL HIGH (ref 0.44–1.00)
GFR calc Af Amer: 12 mL/min — ABNORMAL LOW (ref >60)
GFR calc non Af Amer: 11 mL/min — ABNORMAL LOW (ref >60)
GLUCOSE: 73 mg/dL (ref 65–99)
Potassium: 3.7 mmol/L (ref 3.5–5.1)
SODIUM: 137 mmol/L (ref 135–145)
TOTAL PROTEIN: 5.4 g/dL — AB (ref 6.5–8.1)

## 2016-12-19 LAB — PREPARE RBC (CROSSMATCH)

## 2016-12-19 LAB — TROPONIN I
Troponin I: 2.11 ng/mL (ref <0.03)
Troponin I: 2.28 ng/mL (ref <0.03)

## 2016-12-19 LAB — LIPID PANEL
CHOL/HDL RATIO: 4.2 ratio
CHOLESTEROL: 135 mg/dL (ref 0–200)
HDL: 32 mg/dL — ABNORMAL LOW (ref >40)
LDL Cholesterol: 80 mg/dL (ref 0–99)
TRIGLYCERIDES: 114 mg/dL (ref <150)
VLDL: 23 mg/dL (ref 0–40)

## 2016-12-19 LAB — MRSA PCR SCREENING: MRSA by PCR: NEGATIVE

## 2016-12-19 MED ORDER — MORPHINE SULFATE (PF) 4 MG/ML IV SOLN
INTRAVENOUS | Status: AC
  Administered 2016-12-19: 4 mg
  Filled 2016-12-19: qty 1

## 2016-12-19 MED ORDER — ORAL CARE MOUTH RINSE
15.0000 mL | Freq: Two times a day (BID) | OROMUCOSAL | Status: DC
  Administered 2016-12-19 – 2016-12-23 (×7): 15 mL via OROMUCOSAL

## 2016-12-19 MED ORDER — OXYCODONE-ACETAMINOPHEN 5-325 MG PO TABS
ORAL_TABLET | ORAL | Status: AC
  Administered 2016-12-19: 2 via ORAL
  Filled 2016-12-19: qty 2

## 2016-12-19 MED ORDER — SODIUM CHLORIDE 0.9 % IV SOLN: Freq: Once | INTRAVENOUS | Status: DC

## 2016-12-19 NOTE — Progress Notes (Signed)
FemStop removed from right groin with M.Norment and this Clinical research associatewriter. No s/s of bleeding noted, positive pedal pulses noted via doppler. Lower extremity warm and patient denies any acute distress at this time. RN will continue to monitor.

## 2016-12-19 NOTE — Consult Note (Signed)
WOC Nurse wound consult note Reason for Consult: Wound on bilateral LEs and left foot toes Wound type: PAD, trauma Pressure Injury POA: Yes/No Measurement:Left medial LE:  3.5 x 2 with depth obscured by the presence of eschar, no exudate.  Left lateral LE:  3cm x 1cm x 0.1cm partial thickness with pink, moist base and scant serous exudate.  Right LE (anterior):  2.5cm x 1.5cm x 0.2cm full thickness wound with light yellow, moist wound bed and small amount of serous exudate. Wound bed: Drainage (amount, consistency, odor) As described above Periwound: with mild periwound erythema Dressing procedure/placement/frequency: I have implemented conservative wound care using betadine swab sticks to the left toes with eschar in an effort to keep them dry and infection free.  Prevalon Boots are provided bilaterally to protect from injury.  The LE wounds will be dressed twice daily with saline moistened gauze and topped with protective silicone foam.  Family and patient are in agreement with POC. [facility] by Laroy AppleM. Robson. WOC nursing team will not follow, but will remain available to this patient, the nursing and medical teams.  Please re-consult if needed. Thanks, Ladona MowLaurie Chanley Mcenery, MSN, RN, GNP, Hans EdenCWOCN, CWON-AP, FAAN  Pager# [phone]

## 2016-12-19 NOTE — Consult Note (Addendum)
Renal Service Consult Note [facility]  Dorthula Perfectancy L Cogburn 12/19/2016 Maree KrabbeSCHERTZ,Suki Crockett D Requesting Physician:  Dr Tresa EndoKelly   Reason for Consult:  ESRD pt with acute MI HPI: The patient is a 76 y.o. year-old with history of HTN, CVA, ESRD on HD, HL and bioAVR/ CABG 2011) presented yesterday for LE angiogram due to worsening foot pain.  Pre procedure was found to have chest pain w EKG changes. Went to cath lab and had critical stenosis of the SVG-to-PDA, patency of the SVG-diag and nonvisualization of the LIMA-LAD (L [place] artery couldn't be cannulated). PCI of SVG-PDA was performed. Has had some hypotension post-procedure.  We are asked to see for dialysis.     Patient started HD 6 weeks ago.  Has had wounds L lower leg getting worse and also coldish L foot and darkening of some of the toes on the left foot.  No CP today.  No SOB or edema, no n/v/d, no cough or abd pain.    ROS  denies CP  no joint pain   no HA  no blurry vision  no rash  no diarrhea  no nausea/ vomiting   Past Medical History  Past Medical History:  Diagnosis Date  . Anemia   . CAD (coronary artery disease)   . CFS (chronic fatigue syndrome)   . Chronic rhinitis   . Complication of anesthesia    "a little hard waking up"  . Constipation    due to iron  . Coronary atherosclerosis   . Critical lower limb ischemia 12/13/2016  . Depression   . Difficult intubation    "I have a small opening" When she had her CABG   . DJD (degenerative joint disease)   . ESRD on hemodialysis (HCC) 12/17/2016  . Hyperlipidemia   . Hypertension   . Hypothyroidism   . Kidney stone   . MDD (major depressive disorder)   . Neuropathy (HCC)    in legs  . Obesity   . Osteopenia   . PAD (peripheral artery disease) (HCC) 12/22/2016  . Renal insufficiency    Post Op  . Stroke [facility](HCC)    TIA, several   Past Surgical History  Past Surgical History:  Procedure Laterality Date  . ABDOMINAL HYSTERECTOMY  1976  . AORTIC VALVE  REPLACEMENT    . AV FISTULA PLACEMENT Left 08/07/2016   Procedure: CREATION OF  LEFT UPPER ARM ARTERIOVENOUS (AV) FISTULA;  Surgeon: Larina Earthlyodd F Early, MD;  [facility];  Service: Vascular;  Laterality: Left;  . BACK SURGERY  1980's  . CARDIAC VALVE REPLACEMENT    . CORONARY ARTERY BYPASS GRAFT    . KNEE ARTHROSCOPY     Family History  Family History  Problem Relation Age of Onset  . Alzheimer's disease Mother   . Aortic stenosis Father   . Heart disease Father   . Heart disease Son    Social History  reports that she has never smoked. She has never used smokeless tobacco. She reports that she does not drink alcohol or use drugs. Allergies  Allergies  Allergen Reactions  . Prednisone Other (See Comments)    Makes her loony   Home medications Prior to Admission medications   Medication Sig Start Date End Date Taking? Authorizing Provider  acetaminophen (TYLENOL) 325 MG tablet Take 2 tablets (650 mg total) by mouth every 6 (six) hours as needed for mild pain (or Fever >/= 101). 11/07/16  Yes Lenox PondsEdwin Silva Zapata, MD  amLODipine (NORVASC) 10 MG tablet  Take 1 tablet (10 mg total) by mouth daily. 05/10/14  Yes [name], MD  aspirin 81 MG tablet Take 1 tablet (81 mg total) by mouth daily. 11/07/16  Yes Lenox Ponds, MD  calcium acetate (PHOSLO) 667 MG capsule Take 667 mg by mouth 3 (three) times daily with meals.    Yes Historical Provider, MD  cholecalciferol (VITAMIN D) 1000 units tablet Take 2,000 Units by mouth daily.    Yes Historical Provider, MD  Cyanocobalamin (VITAMIN B-12 CR) 1500 MCG TBCR Take 1,500 mcg by mouth daily.   Yes Historical Provider, MD  gabapentin (NEURONTIN) 300 MG capsule Take 300 mg by mouth 2 (two) times daily.  03/27/16  Yes Historical Provider, MD  levothyroxine (SYNTHROID, LEVOTHROID) 112 MCG tablet Take 112 mcg by mouth daily before breakfast.  12/11/15  Yes Historical Provider, MD  LORazepam (ATIVAN) 0.5 MG tablet Take 0.5 mg by mouth 2 (two) times daily  as needed. Anxiety/pain. 11/23/16  Yes Historical Provider, MD  losartan (COZAAR) 50 MG tablet Take 1 tablet (50 mg total) by mouth at bedtime. 11/07/16  Yes Lenox Ponds, MD  senna-docusate (SENOKOT-S) 8.6-50 MG tablet Take 1 tablet by mouth at bedtime as needed for mild constipation. 11/07/16  Yes Lenox Ponds, MD  traMADol (ULTRAM) 50 MG tablet Take 50 mg by mouth 4 (four) times daily as needed for pain. 11/23/16  Yes Historical Provider, MD  Darbepoetin Alfa (ARANESP) 150 MCG/0.3ML SOSY injection Inject 0.3 mLs (150 mcg total) into the vein every Wednesday with hemodialysis. Patient not taking: Reported on 12/16/2016 11/11/16   Lenox Ponds, MD  Dextromethorphan-Guaifenesin (CORICIDIN HBP CONGESTION/COUGH) 10-200 MG CAPS Take 1 capsule by mouth daily as needed (cold).    Historical Provider, MD   Liver Function Tests  Recent Labs Lab 12/19/16 0229  AST 37  ALT 17  ALKPHOS 59  BILITOT 0.6  PROT 5.4*  ALBUMIN 2.4*   No results for input(s): LIPASE, AMYLASE in the last 168 hours. CBC  Recent Labs Lab 2016/12/24 1230 2016-12-24 1547 12/19/16 0822  WBC  --  10.1 13.3*  HGB 8.5* 7.9* 7.2*  HCT 25.0* 24.7* 21.9*  MCV  --  100.4* 100.0  PLT  --  283 271   Basic Metabolic Panel  Recent Labs Lab 12-24-2016 1230 12/19/16 0229 12/19/16 0822  NA 135 137 135  K 3.4* 3.7 3.9  CL 98* 98* 101  CO2  --  26 22  GLUCOSE 98 73 72  BUN 28* 30* 33*  CREATININE 3.50* 3.76* 4.11*  CALCIUM  --  8.1* 7.9*   Iron/TIBC/Ferritin/ %Sat    Component Value Date/Time   IRON 49 10/20/2016 0929   TIBC 354 10/20/2016 0929   FERRITIN 387 (H) 10/20/2016 0929   IRONPCTSAT 14 10/20/2016 0929    Vitals:   12/19/16 0700 12/19/16 0759 12/19/16 0800 12/19/16 0900  BP: (!) 99/23  (!) 145/95 115/72  Pulse: 71  80 76  Resp: 12  15 15   Temp:  99.8 F (37.7 C)    TempSrc:  Oral    SpO2: 99%  90% 100%  Weight:      Height:       Exam Gen alert, obese, pleasant WF no distress No rash,  cyanosis or gangrene Sclera anicteric, throat clear  No jvd or bruits Chest clear bilat RRR 2/6 sem, no RG Abd soft ntnd no mass or ascites +bs  GU defer MS no joint effusions or deformity Ext 1+ LLE pretib  edema / +2 necrotic skin wounds L lower leg  Neuro is alert, Ox 3 , nf    Dialysis: TTS NW 4h  67kg   2/2.25 bath  Hep 2000  LUA AVF   16ga needles (does not want 15 ga)  - darbe 200/ wk last 2/8  - venofer 100 mg/ hd, one dose left  - last Hb 8, Ca 9, P 5.8, pth 251  - tums 1 ac, cozaar 50 hs  Assessment: 1.  Acute MI - sp PCI to SVG-PDA 2/9. Started on [place]. Aggrastat was dc'd around 8 am today. 2.  ESRD on HD TTS - started HD 6 wks ago, using LUA AVF 3.  Volume - is up 6kg by wts, no gross excess on exam, lungs clear . Will get CXR, wt's may be off 4.  HTN - bp's soft; home meds (losartan/ norvasc) on hold. We only have her taking losartan 5.  Hx CABG/ bio-AVR 2011 6.  Anemia of CKD - Hb 7.2 today, was 8 at OP HD so not far off. Cont max darbe, complete Fe load. Next esa due on 2/15.  Will transfuse with HD today.  7.  MBD - cont tums as binder   Plan - HD today upstairs, prbc x 2, UF 1- 2kg if BP tolerates   Vinson Moselle MD [employer] pager [phone]   12/19/2016, 10:52 AM

## 2016-12-19 NOTE — Progress Notes (Addendum)
Progress Note  Patient Name: Jill Johnson Date of Encounter: 12/19/2016  Primary Cardiologist: Anne FuSkains  Subjective   Feeling better this AM, no CP, no SOB. Mildly confused. Mild temp now resolved.  Post cath  Inpatient Medications    Scheduled Meds: . amLODipine  10 mg Oral Daily  . aspirin  81 mg Oral Daily  . atorvastatin  80 mg Oral q1800  . calcium acetate  667 mg Oral TID WC  . carvedilol  3.125 mg Oral BID WC  . cholecalciferol  2,000 Units Oral Daily  . gabapentin  300 mg Oral BID  . levothyroxine  112 mcg Oral QAC breakfast  . losartan  50 mg Oral QHS  . sodium chloride flush  3 mL Intravenous Q12H  . sodium chloride flush  3 mL Intravenous Q12H  . sodium chloride flush  3 mL Intravenous Q12H  . ticagrelor  90 mg Oral BID  . vitamin B-12  1,500 mcg Oral Daily   Continuous Infusions: . nitroGLYCERIN Stopped (12/10/2016 2045)   PRN Meds: sodium chloride, sodium chloride, sodium chloride, acetaminophen, ALPRAZolam, dextromethorphan-guaiFENesin, morphine injection, nitroGLYCERIN, ondansetron (ZOFRAN) IV, oxyCODONE-acetaminophen, senna-docusate, sodium chloride flush, sodium chloride flush, sodium chloride flush, traMADol, zolpidem   Vital Signs    Vitals:   12/19/16 0530 12/19/16 0600 12/19/16 0700 12/19/16 0759  BP: (!) 102/47 (!) 97/35 (!) 99/23   Pulse: 83 76 71   Resp:   12   Temp:    99.8 F (37.7 C)  TempSrc:    Oral  SpO2: (!) 85% 90% 99%   Weight:      Height:        Intake/Output Summary (Last 24 hours) at 12/19/16 0830 Last data filed at 12/15/2016 2015  Gross per 24 hour  Intake              120 ml  Output                0 ml  Net              120 ml   Filed Weights   12/20/2016 1135 01/04/2017 2015 12/19/16 0500  Weight: 158 lb (71.7 kg) 164 lb 0.4 oz (74.4 kg) 164 lb 0.4 oz (74.4 kg)    Telemetry    NSR, no VT - Personally Reviewed  ECG    ST depression lateral improved - Personally Reviewed  Physical Exam   GEN: No acute distress.    Neck: No JVD Cardiac: RRR, no murmurs, rubs, or gallops.  Respiratory: Clear to auscultation bilaterally. GI: Soft, nontender, non-distended  MS: No edema; No deformity. Post cath c/d/i Neuro:  Nonfocal  Psych: Normal affect  Skin: Left lower extremity wounds wrapped, excoriations noted  Labs    Chemistry Recent Labs Lab 12/21/2016 1230 12/19/16 0229  NA 135 137  K 3.4* 3.7  CL 98* 98*  CO2  --  26  GLUCOSE 98 73  BUN 28* 30*  CREATININE 3.50* 3.76*  CALCIUM  --  8.1*  PROT  --  5.4*  ALBUMIN  --  2.4*  AST  --  37  ALT  --  17  ALKPHOS  --  59  BILITOT  --  0.6  GFRNONAA  --  11*  GFRAA  --  12*  ANIONGAP  --  13     Hematology Recent Labs Lab 12/10/2016 1230 12/28/2016 1547  WBC  --  10.1  RBC  --  2.46*  HGB 8.5* 7.9*  HCT 25.0* 24.7*  MCV  --  100.4*  MCH  --  32.1  MCHC  --  32.0  RDW  --  16.2*  PLT  --  283    Cardiac Enzymes Recent Labs Lab January 09, 2017 2108 12/19/16 0229  TROPONINI 0.96* 2.11*   No results for input(s): TROPIPOC in the last 168 hours.   BNPNo results for input(s): BNP, PROBNP in the last 168 hours.   DDimer No results for input(s): DDIMER in the last 168 hours.   Radiology    No results found.  Cardiac Studies   Cardiac catheterization 01/09/2017 Conclusion   1. Severe 2 vessel CAD with total occlusion of the LAD and total occlusion of the RCA 2. S/P CABG with patency of the SVG-diag, critical stenosis of the SVG-PDA, and non-visualization of the LIMA-LAD 3. Successful PCI of the SVG-PDA with a DES platform  Recommend:  Load with Brilinta 180 mg when patient able to swallow medicine  Continue aggrastat at reduced dose until patient takes brilinta  DAPT with ASA/brilinta as tolerated at least 6 months. If she needs to switch to a less-potent thienopyridine for vascular surgery she could be switched to plavix.    Echocardiogram 10/31/16 - Left ventricle: The cavity size was normal. Wall thickness was   increased in a  pattern of moderate LVH. Systolic function was   normal. The estimated ejection fraction was in the range of 60%   to 65%. Wall motion was normal; there were no regional wall   motion abnormalities. Features are consistent with a pseudonormal   left ventricular filling pattern, with concomitant abnormal   relaxation and increased filling pressure (grade 2 diastolic   dysfunction). - Aortic valve: Bioprosthesis in aortic position. There was trivial   regurgitation. Mean gradient (S): 29 mm Hg. Peak gradient (S): 51   mm Hg. VTI ratio of LVOT to aortic valve: 0.4. - Mitral valve: Calcified annulus. Mildly thickened leaflets .   There was mild to moderate regurgitation. - Left atrium: The atrium was severely dilated. - Tricuspid valve: There was trivial regurgitation. - Pulmonary arteries: Systolic pressure could not be accurately   estimated. - Pericardium, extracardiac: There was no pericardial effusion.  Impressions:  - Moderate LVH with LVEF 60-65% and grade 2 diastolic dysfunction.   Severe left atrial enlargement. Moderately calcified mitral   annulus with thickened leaflets and mild to moderate mitral   regurgitation. 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   Trivial aortic regurgitation noted. Gradients have increased   compared to the prior study with mean gradient 29 mmHg, although   the dimensionless index does not suggest prosthetic valve   stenosis. Trivial tricuspid regurgitation, unable to assess PASP.  Patient Profile     76 y.o. female here with non-ST elevation myocardial infarction status post cardiac catheterization with SVG to PDA DES stent placement, post CABG 2011 with bioprosthetic aortic valve replacement, end-stage renal disease on hemodialysis, severe peripheral vascular disease with critical limb ischemia of left foot with ulcerated wounds, status post left upper arm AV fistula followed by Dr. Arbie Cookey,   Assessment & Plan    Non-ST elevation  myocardial infarction  - SVG to PDA stent placement, DES.  - Dual antiplatelet therapy for at least 6 months  - Continue with Brilinta for now, can switch to Plavix if necessary in the future. Please see catheterization note.  - Aggressive secondary prevention, aspirin, Brilinta, atorvastatin, carvedilol  End-stage renal disease with hemodialysis  - Left upper arm fistula  -  Fluid status per HD. We will contact nephrology to make sure that they are aware that she is here.  Peripheral vascular disease with critical limb ischemia  - Per Dr. Arbie Cookey  - Non-ST elevation myocardial infarction has slightly delayed angiography of lower extremity.  - I would be comfortable with attempting angiography once again on Monday 12/21/16 on dual antiplatelet therapy allowing her time to reach steady state with her dual antiplatelet therapy.  - Per review of office visit, Dr. early on 12/15/16, she is at high risk for limb amputation.  Hyperlipidemia  - Atorvastatin 80  Bioprosthetic aortic valve replacement 2011  - Antibiotic prophylaxis if necessary  - Recent echocardiogram in December 2017 showed normal function  Coronary artery disease status post CABG 2011  - See cardiac catheterization report as above.  - SVG to PDA stenting.  Mild confusion  - Delirium.   - We will have low threshold to consult tried hospitalist if this deteriorates.  Hypotension  - We will cancel losartan 50 mg and amlodipine for now  - Hold carvedilol  Anemia  - Hemoglobin 7.9 on 12/13/2016  - Blood products were required, during hemodialysis would be beneficial.  Critical care time 35 minutes spent with extensive data review, cardiac catheterization review, patient discussion, examination with her multisystem organ dysfunction, acute heart attack, critical limb ischemia, end-stage renal disease.  Signed, Donato Schultz, MD  12/19/2016, 8:30 AM

## 2016-12-19 NOTE — Progress Notes (Addendum)
Patient arrived to unit per bed.  Reviewed treatment plan and this RN agrees.  Report received from bedside RN, Shanda BumpsJessica.  Consent obtained.  Patient A && O to self, place. Lung sounds diminished to ausculation in all fields. Genearlized edema. Cardiac: NSR, HB.  Prepped LUAVF with alcohol and cannulated with two 16 gauge needles.  Pulsation of blood noted.  Flushed access well with saline per protocol.  Connected and secured lines and initiated tx at 1345.  UF goal of 3500 mL and net fluid removal of 2000 mL.  Will continue to monitor.

## 2016-12-19 NOTE — Progress Notes (Addendum)
[id number] Cardiac Rehab Pt is not ambulatory at this time. Completed MI and stent education with pt and son. I gave them a MI booklet. We discussed risk factors and modifications, also proper use of sl NTG. She voices understanding. Will not send referral for Outpt. CRP due to ischemic limb and pending treatment. We will sign off Cardiac Rehab goal met and we will sign off since she is not ambulatory. Rodney Langton RN

## 2016-12-19 NOTE — Procedures (Signed)
  I was present at this dialysis session, have reviewed the session itself and made  appropriate changes Vinson Moselleob Yusef Lamp MD [facility] pager [phone]   12/19/2016, 2:43 PM

## 2016-12-19 NOTE — Progress Notes (Signed)
Dialysis treatment terminated with 52 minutes remaining in prescribed treatment, terminated due to infiltration at venous site.  Nephrology notified.  2600 mL ultrafiltrated and net fluid removal 1100 mL.    Patient extremely tearful. Lung sounds diminished to ausculation in all fields. Generalized edema. Cardiac: NSR.  Disconnected lines and removed needles.  Pressure held for 10 minutes and band aid/gauze dressing applied.  Report given to bedside RN, Shanda BumpsJessica.

## 2016-12-20 DIAGNOSIS — R410 Disorientation, unspecified: Secondary | ICD-10-CM

## 2016-12-20 DIAGNOSIS — I70245 Atherosclerosis of native arteries of left leg with ulceration of other part of foot: Secondary | ICD-10-CM

## 2016-12-20 LAB — CBC
HEMATOCRIT: 25.5 % — AB (ref 36.0–46.0)
Hemoglobin: 8.4 g/dL — ABNORMAL LOW (ref 12.0–15.0)
MCH: 31.2 pg (ref 26.0–34.0)
MCHC: 32.9 g/dL (ref 30.0–36.0)
MCV: 94.8 fL (ref 78.0–100.0)
Platelets: 262 10*3/uL (ref 150–400)
RBC: 2.69 MIL/uL — ABNORMAL LOW (ref 3.87–5.11)
RDW: 17.4 % — AB (ref 11.5–15.5)
WBC: 14.5 10*3/uL — AB (ref 4.0–10.5)

## 2016-12-20 LAB — TYPE AND SCREEN
ABO/RH(D): A POS
Antibody Screen: NEGATIVE
Unit division: 0
Unit division: 0

## 2016-12-20 LAB — HEPATITIS B SURFACE ANTIBODY,QUALITATIVE: HEP B S AB: REACTIVE

## 2016-12-20 LAB — HEPATITIS B SURFACE ANTIGEN: HEP B S AG: NEGATIVE

## 2016-12-20 LAB — HEPATITIS B CORE ANTIBODY, TOTAL: Hep B Core Total Ab: NEGATIVE

## 2016-12-20 LAB — GLUCOSE, CAPILLARY: Glucose-Capillary: 138 mg/dL — ABNORMAL HIGH (ref 65–99)

## 2016-12-20 MED ORDER — GABAPENTIN 100 MG PO CAPS
200.0000 mg | ORAL_CAPSULE | Freq: Two times a day (BID) | ORAL | Status: DC
  Administered 2016-12-20 – 2016-12-31 (×21): 200 mg via ORAL
  Filled 2016-12-20 (×21): qty 2

## 2016-12-20 MED ORDER — MAGNESIUM HYDROXIDE 400 MG/5ML PO SUSP
30.0000 mL | Freq: Every day | ORAL | Status: DC | PRN
  Administered 2016-12-20: 30 mL via ORAL
  Filled 2016-12-20: qty 30

## 2016-12-20 MED ORDER — OXYCODONE HCL 5 MG PO TABS
5.0000 mg | ORAL_TABLET | ORAL | Status: DC | PRN
  Administered 2016-12-20 – 2016-12-24 (×7): 5 mg via ORAL
  Filled 2016-12-20 (×6): qty 1

## 2016-12-20 MED ORDER — ACETAMINOPHEN 500 MG PO TABS
1000.0000 mg | ORAL_TABLET | Freq: Three times a day (TID) | ORAL | Status: DC
  Administered 2016-12-20 – 2017-01-01 (×34): 1000 mg via ORAL
  Filled 2016-12-20 (×35): qty 2

## 2016-12-20 MED ORDER — SENNOSIDES-DOCUSATE SODIUM 8.6-50 MG PO TABS: 1.0000 | ORAL_TABLET | Freq: Every day | ORAL | Status: DC

## 2016-12-20 NOTE — Progress Notes (Signed)
[facility] Progress Note   Subjective: upset about her medical problems  Vitals:   12/20/16 0300 12/20/16 0500 12/20/16 0700 12/20/16 0800  BP: (!) 146/40     Pulse:      Resp: 20  20 12   Temp: 98.4 F (36.9 C)     TempSrc: Axillary     SpO2: 96%     Weight:  70.7 kg (155 lb 13.8 oz)    Height:        Inpatient medications: . sodium chloride   Intravenous Once  . aspirin  81 mg Oral Daily  . atorvastatin  80 mg Oral q1800  . calcium acetate  667 mg Oral TID WC  . carvedilol  3.125 mg Oral BID WC  . cholecalciferol  2,000 Units Oral Daily  . gabapentin  300 mg Oral BID  . levothyroxine  112 mcg Oral QAC breakfast  . mouth rinse  15 mL Mouth Rinse BID  . sodium chloride flush  3 mL Intravenous Q12H  . sodium chloride flush  3 mL Intravenous Q12H  . sodium chloride flush  3 mL Intravenous Q12H  . ticagrelor  90 mg Oral BID  . vitamin B-12  1,500 mcg Oral Daily    sodium chloride, sodium chloride, sodium chloride, acetaminophen, ALPRAZolam, dextromethorphan-guaiFENesin, morphine injection, nitroGLYCERIN, ondansetron (ZOFRAN) IV, oxyCODONE-acetaminophen, senna-docusate, sodium chloride flush, sodium chloride flush, sodium chloride flush, traMADol, zolpidem  Exam: Gen alert, obese, pleasant WF no distress No jvd or bruits Chest clear bilat RRR 2/6 sem, no RG Abd soft ntnd no mass or ascites +bs      Ext 1+ LLE pretib edema / +2 necrotic skin wounds L lower leg  Neuro is alert, Ox 3 , nf LUA AVF +infiltrations + bruit  CXR 2/10 - vasc congestion  Dialysis: TTS NW 4h  67kg   2/2.25 bath  Hep 2000  LUA AVF   16ga needles (does not want 15 ga)  - darbe 200/ wk last 2/8  - venofer 100 mg/ hd, one dose left  - last Hb 8, Ca 9, P 5.8, pth 251  - tums 1 ac, cozaar 50 hs  Assessment: 1.  Acute MI - sp PCI to SVG-PDA 2/9. Started on [place].  2.  ESRD on HD TTS - started HD 6 wks ago, LUA AVF w infiltrations > 1. May need TDC if persists.  3.  PAD / L  leg ulcers - per VVS, sig disease LLE 4.  HTN - on coreg only here 5.  Hx CABG/ bio-AVR 2011 6.  Anemia of CKD - Hb 7.2 today, was 8 at OP HD so not far off. Cont max darbe, complete Fe load. Next esa due on 2/15.  Will transfuse with HD today.  7.  MBD - tums as binder 8.  Volume - up 3kg today, no resp issues, get vol down next HD   Plan - cont HD TTS   Vinson Moselle MD [facility] pager [phone]   12/20/2016, 9:02 AM    Recent Labs Lab 01/04/2017 1230 12/19/16 0229 12/19/16 0822  NA 135 137 135  K 3.4* 3.7 3.9  CL 98* 98* 101  CO2  --  26 22  GLUCOSE 98 73 72  BUN 28* 30* 33*  CREATININE 3.50* 3.76* 4.11*  CALCIUM  --  8.1* 7.9*    Recent Labs Lab 12/19/16 0229  AST 37  ALT 17  ALKPHOS 59  BILITOT 0.6  PROT 5.4*  ALBUMIN 2.4*  Recent Labs Lab 2017-09-17 1547 12/19/16 0822 12/20/16 0205  WBC 10.1 13.3* 14.5*  HGB 7.9* 7.2* 8.4*  HCT 24.7* 21.9* 25.5*  MCV 100.4* 100.0 94.8  PLT 283 271 262   Iron/TIBC/Ferritin/ %Sat    Component Value Date/Time   IRON 49 10/20/2016 0929   TIBC 354 10/20/2016 0929   FERRITIN 387 (H) 10/20/2016 0929   IRONPCTSAT 14 10/20/2016 0929

## 2016-12-20 NOTE — Consult Note (Signed)
Hospitalist Service Medical Consultation   Jill Johnson  WJX:[id number]  DOB: 09/10/1941  DOA: Jan 05, 2017  PCP: Hollice Espy, MD      Requesting physician: Donato Schultz, MD  Reason for consultation: Delirium   History of Present Illness: Jill Johnson is an 76 y.o. female with ESRD on HD started 2 mo ago, CAD s/p CABG and AVR, hypothyroidism, HTN, and recent critical limb ischemia who we are asked to evaluate for delirium.  The patient has been having rest pain in her left leg/foot the last month, saw Dr. Arbie Cookey in clinic and was planned to have outpatient angiography of that leg two days ago.  In short stay prior to the procedure she was having chest pain, ECG showed new ST depressions and so she was taken to the cath lab where one of her grafts was stented with a DES.  Since then, her hospital stay has been complicated by confusion.  Per nursing staff, the patient's family states that she is often more confused and anxious/tearful at night when in the hospital.  Yesterday during the day, she was somewhat confused and agitated.  Late in the day Xanax was given, as well as quite a bit of morphine for her pain (from her MAR I see four doses of oxycodone yesterday, four doses of morphine in last 48 hours, nightly alprazolam, no Ambien given although it is ordered PRN).  Per nursing, she only slept a few hours last night, and was otherwise tearful and agitated the whole night.  Per patient, she was quite confused yesterday and last night, thought she was "in a fishbowl", and mostly complains that her pain at night is unbearable and "driving me crazy."  Currently denies cough, fever, congestion, dyspnea.  Denies dysuria, urinary urgency, cloudy urine, symptoms like a bladder infection.  Leg pain is severe.  Currently denies confusion.  At baseline she lives alone.  Is a retired Public house manager.  Has no dementia that she is aware of, doesn't drive anymore, but is independent still with  ADLs and IADLs.  Mother had dementia in her last 10 years.       Review of Systems:  Review of Systems  Constitutional: Negative for chills, fever and malaise/fatigue.  Respiratory: Negative for cough, sputum production and shortness of breath.   Cardiovascular: Negative for chest pain.  Genitourinary: Negative for dysuria, frequency, hematuria and urgency.  Musculoskeletal:       Leg pain  Neurological: Negative.   Psychiatric/Behavioral: The patient is nervous/anxious.   All other systems reviewed and are negative.    Past Medical History: Past Medical History:  Diagnosis Date  . Anemia   . CAD (coronary artery disease)   . CFS (chronic fatigue syndrome)   . Chronic rhinitis   . Complication of anesthesia    "a little hard waking up"  . Constipation    due to iron  . Coronary atherosclerosis   . Critical lower limb ischemia 01/02/2017  . Depression   . Difficult intubation    "I have a small opening" When she had her CABG   . DJD (degenerative joint disease)   . ESRD on hemodialysis (HCC) 12/29/2016  . Hyperlipidemia   . Hypertension   . Hypothyroidism   . Kidney stone   . MDD (major depressive disorder)   . Neuropathy (HCC)    in legs  . Obesity   . Osteopenia   . PAD (  peripheral artery disease) (HCC) 12/11/2016  . Renal insufficiency    Post Op  . Stroke [facility](HCC)    TIA, several    Past Surgical History: Past Surgical History:  Procedure Laterality Date  . ABDOMINAL HYSTERECTOMY  1976  . AORTIC VALVE REPLACEMENT    . AV FISTULA PLACEMENT Left 08/07/2016   Procedure: CREATION OF  LEFT UPPER ARM ARTERIOVENOUS (AV) FISTULA;  Surgeon: Larina Earthlyodd F Early, MD;  [facility];  Service: Vascular;  Laterality: Left;  . BACK SURGERY  1980's  . CARDIAC VALVE REPLACEMENT    . CORONARY ARTERY BYPASS GRAFT    . KNEE ARTHROSCOPY       Allergies:   Allergies  Allergen Reactions  . Prednisone Other (See Comments)    Makes her loony     Social History:  reports  that she has never smoked. She has never used smokeless tobacco. She reports that she does not drink alcohol or use drugs.   Family History: Family History  Problem Relation Age of Onset  . Alzheimer's disease Mother   . Aortic stenosis Father   . Heart disease Father   . Heart disease Son      Physical Exam: Vitals:   12/20/16 0300 12/20/16 0500 12/20/16 0700 12/20/16 0800  BP: (!) 146/40     Pulse:      Resp: 20  20 12   Temp: 98.4 F (36.9 C)     TempSrc: Axillary     SpO2: 96%     Weight:  70.7 kg (155 lb 13.8 oz)    Height:        Constitutional: Alert and awake, oriented x3, not in any acute distress, although complains of moderate leg pain at this time, worse when leg elevated. Eyes: PERLA, EOMI, irises appear normal, anicteric sclera,  ENMT: external ears and nose appear normal, hearing normal            Lips appears normal, oropharynx mucosa, tongue, posterior pharynx appear normal  Neck: neck appears normal, no masses, normal ROM, no thyromegaly, no JVD  CVS: S1-S2 clear, blowing systolic murmur, no LE edema, I don't appreciate pedal pulses, the left leg is red below the knee, still warm but pain around the wound, there is redness but no swelling, drainage Respiratory:  clear to auscultation bilaterally, no wheezing, rales or rhonchi. Respiratory effort normal. No accessory muscle use.  GI: soft nontender, nondistended, normal bowel sounds, no hepatosplenomegaly, no hernias  Musculoskeletal: no cyanosis, clubbing or edema noted bilaterally except arterial insufficiency changes to L leg Neuro: Cranial nerves II-XII intact, strength symmetrically diminished slightly, sensation Psych: judgement and insight appear normal at this time, oriented to person, place, time and situation, appears anxious but oriented Skin: no rashes or lesions except to LL as noted above   Data reviewed:  I have personally reviewed following labs and imaging studies Labs:  CBC:  Recent  Labs Lab 2017-09-26 1230 2017-09-26 1547 12/19/16 0822 12/20/16 0205  WBC  --  10.1 13.3* 14.5*  HGB 8.5* 7.9* 7.2* 8.4*  HCT 25.0* 24.7* 21.9* 25.5*  MCV  --  100.4* 100.0 94.8  PLT  --  283 271 262    Basic Metabolic Panel:  Recent Labs Lab 2017-09-26 1230 12/19/16 0229 12/19/16 0822  NA 135 137 135  K 3.4* 3.7 3.9  CL 98* 98* 101  CO2  --  26 22  GLUCOSE 98 73 72  BUN 28* 30* 33*  CREATININE 3.50* 3.76* 4.11*  CALCIUM  --  8.1* 7.9*   GFR Estimated Creatinine Clearance: 10.1 mL/min (by C-G formula based on SCr of 4.11 mg/dL (H)). Liver Function Tests:  Recent Labs Lab 12/19/16 0229  AST 37  ALT 17  ALKPHOS 59  BILITOT 0.6  PROT 5.4*  ALBUMIN 2.4*   No results for input(s): LIPASE, AMYLASE in the last 168 hours. No results for input(s): AMMONIA in the last 168 hours. Coagulation profile  Recent Labs Lab 12/16/2016 1547  INR 1.18    Cardiac Enzymes:  Recent Labs Lab 12/28/2016 2108 12/19/16 0229 12/19/16 0822  TROPONINI 0.96* 2.11* 2.28*   BNP: Invalid input(s): POCBNP CBG: No results for input(s): GLUCAP in the last 168 hours. D-Dimer No results for input(s): DDIMER in the last 72 hours. Hgb A1c No results for input(s): HGBA1C in the last 72 hours. Lipid Profile  Recent Labs  12/19/16 0229  CHOL 135  HDL 32*  LDLCALC 80  TRIG 161  CHOLHDL 4.2   Thyroid function studies No results for input(s): TSH, T4TOTAL, T3FREE, THYROIDAB in the last 72 hours.  Invalid input(s): FREET3 Anemia work up No results for input(s): VITAMINB12, FOLATE, FERRITIN, TIBC, IRON, RETICCTPCT in the last 72 hours. Urinalysis    Component Value Date/Time   COLORURINE YELLOW 10/28/2016 1600   APPEARANCEUR CLEAR 10/28/2016 1600   LABSPEC 1.011 10/28/2016 1600   PHURINE 6.5 10/28/2016 1600   GLUCOSEU NEGATIVE 10/28/2016 1600   HGBUR NEGATIVE 10/28/2016 1600   BILIRUBINUR NEGATIVE 10/28/2016 1600   KETONESUR NEGATIVE 10/28/2016 1600   PROTEINUR 30 (A) 10/28/2016  1600   UROBILINOGEN 1.0 01/30/2010 1104   NITRITE NEGATIVE 10/28/2016 1600   LEUKOCYTESUR NEGATIVE 10/28/2016 1600     Sepsis Labs Invalid input(s): PROCALCITONIN,  WBC,  LACTICIDVEN Microbiology Recent Results (from the past 240 hour(s))  MRSA PCR Screening     Status: None   Collection Time: 12/20/2016  8:18 PM  Result Value Ref Range Status   MRSA by PCR NEGATIVE NEGATIVE Final    Comment:        The GeneXpert MRSA Assay (FDA approved for NASAL specimens only), is one component of a comprehensive MRSA colonization surveillance program. It is not intended to diagnose MRSA infection nor to guide or monitor treatment for MRSA infections.        Inpatient Medications:   Scheduled Meds: . sodium chloride   Intravenous Once  . aspirin  81 mg Oral Daily  . atorvastatin  80 mg Oral q1800  . calcium acetate  667 mg Oral TID WC  . carvedilol  3.125 mg Oral BID WC  . cholecalciferol  2,000 Units Oral Daily  . gabapentin  300 mg Oral BID  . levothyroxine  112 mcg Oral QAC breakfast  . mouth rinse  15 mL Mouth Rinse BID  . sodium chloride flush  3 mL Intravenous Q12H  . sodium chloride flush  3 mL Intravenous Q12H  . sodium chloride flush  3 mL Intravenous Q12H  . ticagrelor  90 mg Oral BID  . vitamin B-12  1,500 mcg Oral Daily   Continuous Infusions:   Radiological Exams on Admission: Dg Chest Port 1 View  Result Date: 12/19/2016 CLINICAL DATA:  Early termination of dialysis diminished lung sounds EXAM: PORTABLE CHEST 1 VIEW COMPARISON:  10/28/2016 FINDINGS: Post sternotomy changes and valvular prosthesis. Cardiomegaly is present. Central vascular congestion without overt edema. Possible small left pleural effusion. Atherosclerosis. No pneumothorax. IMPRESSION: Cardiomegaly with central vascular congestion. Possible small left effusion. No overt pulmonary edema. Electronically Signed  By: Jasmine Pang M.D.   On: 12/19/2016 23:59    Impression/Recommendations Principal  Problem:   Unstable angina (HCC) Active Problems:   S/P AVR (aortic valve replacement)   Anemia, chronic disease   ESRD on hemodialysis (HCC)   Critical lower limb ischemia   Coronary artery disease involving native coronary artery of native heart with unstable angina pectoris (HCC)    1. Delirium: ICU setting and uncontrolled pain puts this patient at HIGH risk of delirium, so this is to some extent expected.  She has a rising WBC, but no fever or tachycardia at this time. -Defer urinalysis/culture at this time, as she is asymptomatic, but would order that and blood cultures obviously if patient develops fever -Defer antibiotic treatment of possible cellulitis to primary team and Vascular Surgery, as I am not convinced this is infected  Delirium precautions:   -At night, make sure lights and TV off, minimize interruptions  -During day: blinds open and lights on  -Glasses/hearing aid with patient if she has them  -Frequent reorientation  -Treat constipation as able  -PT/OT when able, early ambulation  -Avoid sedation medications/Beers list medications  In particular, regarding sedating medications: avoid alprazolam, Ambien, morphine (these are stopped)  -In ESRD, titrate down gabapentin   -Will schedule acetaminophen  -Try to use oxycodone as sparingly as possible  -Non-pharmacologic approaches like keeping the leg dependent may help  -As a last resort for agitation not improved with redirection and putting patient at risk for self-harm, haloperidol 1 mg IV could be considered         Thank you for this consultation.  Our [facility] hospitalist team will follow the patient with you.     Alberteen Sam M.D. [facility] 12/20/2016, 9:03 AM

## 2016-12-20 NOTE — Progress Notes (Signed)
  Progress Note    12/20/2016 11:57 AM 2 Days Post-Op  Subjective:  Somewhat confused, having significant pain in left leg  Vitals:   12/20/16 0905 12/20/16 1133  BP: (!) 153/61 (!) 123/45  Pulse: 72   Resp: 16 15  Temp:  99.1 F (37.3 C)    Physical Exam: Awake and alert Abdomen is soft Right groin with evolving hematoma Left leg is ttp throughout, cannot palpate popliteal pulse on left but is limited by patient movement. Dressing on left foot is cdi without foul smell  CBC    Component Value Date/Time   WBC 14.5 (H) 12/20/2016 0205   RBC 2.69 (L) 12/20/2016 0205   HGB 8.4 (L) 12/20/2016 0205   HCT 25.5 (L) 12/20/2016 0205   PLT 262 12/20/2016 0205   MCV 94.8 12/20/2016 0205   MCV 90.5 11/12/2014 1451   MCH 31.2 12/20/2016 0205   MCHC 32.9 12/20/2016 0205   RDW 17.4 (H) 12/20/2016 0205   LYMPHSABS 1.1 10/28/2016 1545   MONOABS 0.4 10/28/2016 1545   EOSABS 0.0 10/28/2016 1545   BASOSABS 0.0 10/28/2016 1545    BMET    Component Value Date/Time   NA 135 12/19/2016 0822   K 3.9 12/19/2016 0822   CL 101 12/19/2016 0822   CO2 22 12/19/2016 0822   GLUCOSE 72 12/19/2016 0822   BUN 33 (H) 12/19/2016 0822   CREATININE 4.11 (H) 12/19/2016 0822   CALCIUM 7.9 (L) 12/19/2016 0822   CALCIUM 8.4 (L) 10/20/2016 0929   GFRNONAA 10 (L) 12/19/2016 0822   GFRAA 11 (L) 12/19/2016 0822    INR    Component Value Date/Time   INR 1.18 12/23/2016 1547     Intake/Output Summary (Last 24 hours) at 12/20/16 1157 Last data filed at 12/20/16 1100  Gross per 24 hour  Intake             1530 ml  Output             1100 ml  Net              430 ml     Assessment/Plan:  76 y.o. female is s/p pci on dapt. Will need angiogram and given difficulty with last procedure will likely be done in OR .  NPO at midnight for possible procedure tomorrow.     Tavari Loadholt C. Randie Heinzain, MD [facility] [place] Office: [phone] Pager: [phone]  12/20/2016 11:57  AM

## 2016-12-20 NOTE — Progress Notes (Signed)
Pt to floor, telemetry placed and verified, family at bedside, call light and phone within reach, side rails up X2, bed alarm activated.  Raymon MuttonGwen Vipul Cafarelli RN

## 2016-12-20 NOTE — Progress Notes (Signed)
Progress Note  Patient Name: Jill Johnson Date of Encounter: 12/20/2016  Primary Cardiologist: Anne Fu  Subjective   Sleepy this AM, no CP, no SOB. Confused. Mild temp now resolved.  Post cath. Nurse reported anxiety overnight, could not calm her down. Family could not calm her down.   Inpatient Medications    Scheduled Meds: . sodium chloride   Intravenous Once  . aspirin  81 mg Oral Daily  . atorvastatin  80 mg Oral q1800  . calcium acetate  667 mg Oral TID WC  . carvedilol  3.125 mg Oral BID WC  . cholecalciferol  2,000 Units Oral Daily  . gabapentin  300 mg Oral BID  . levothyroxine  112 mcg Oral QAC breakfast  . mouth rinse  15 mL Mouth Rinse BID  . sodium chloride flush  3 mL Intravenous Q12H  . sodium chloride flush  3 mL Intravenous Q12H  . sodium chloride flush  3 mL Intravenous Q12H  . ticagrelor  90 mg Oral BID  . vitamin B-12  1,500 mcg Oral Daily   Continuous Infusions:  PRN Meds: sodium chloride, sodium chloride, sodium chloride, acetaminophen, ALPRAZolam, dextromethorphan-guaiFENesin, morphine injection, nitroGLYCERIN, ondansetron (ZOFRAN) IV, oxyCODONE-acetaminophen, senna-docusate, sodium chloride flush, sodium chloride flush, sodium chloride flush, traMADol, zolpidem   Vital Signs    Vitals:   12/19/16 1900 12/19/16 2345 12/20/16 0300 12/20/16 0500  BP: (!) 150/37 108/63 (!) 146/40   Pulse: 70 64    Resp: 16 19 20    Temp: 98.7 F (37.1 C) 98.6 F (37 C) 98.4 F (36.9 C)   TempSrc: Axillary Axillary Axillary   SpO2: 97% 97% 96%   Weight:    155 lb 13.8 oz (70.7 kg)  Height:        Intake/Output Summary (Last 24 hours) at 12/20/16 0816 Last data filed at 12/19/16 2000  Gross per 24 hour  Intake             1200 ml  Output             1100 ml  Net              100 ml   Filed Weights   12/19/16 1302 12/19/16 1624 12/20/16 0500  Weight: 164 lb 0.4 oz (74.4 kg) 161 lb 9.6 oz (73.3 kg) 155 lb 13.8 oz (70.7 kg)    Telemetry    NSR, no VT  - Personally Reviewed  ECG    ST depression lateral improved - Personally Reviewed  Physical Exam   GEN: No acute distress.   Neck: No JVD Cardiac: RRR, no murmurs, rubs, or gallops.  Respiratory: Clear to auscultation bilaterally. GI: Soft, nontender, non-distended  MS: No edema; No deformity. Post cath c/d/i Neuro:  Nonfocal  Psych: Normal affect  Skin: Left lower extremity wounds wrapped, excoriations noted  Labs    Chemistry  Recent Labs Lab 12/21/2016 1230 12/19/16 0229 12/19/16 0822  NA 135 137 135  K 3.4* 3.7 3.9  CL 98* 98* 101  CO2  --  26 22  GLUCOSE 98 73 72  BUN 28* 30* 33*  CREATININE 3.50* 3.76* 4.11*  CALCIUM  --  8.1* 7.9*  PROT  --  5.4*  --   ALBUMIN  --  2.4*  --   AST  --  37  --   ALT  --  17  --   ALKPHOS  --  59  --   BILITOT  --  0.6  --  GFRNONAA  --  11* 10*  GFRAA  --  12* 11*  ANIONGAP  --  13 12     Hematology  Recent Labs Lab 01/03/2017 1547 12/19/16 0822 12/20/16 0205  WBC 10.1 13.3* 14.5*  RBC 2.46* 2.19* 2.69*  HGB 7.9* 7.2* 8.4*  HCT 24.7* 21.9* 25.5*  MCV 100.4* 100.0 94.8  MCH 32.1 32.9 31.2  MCHC 32.0 32.9 32.9  RDW 16.2* 16.2* 17.4*  PLT 283 271 262    Cardiac Enzymes  Recent Labs Lab 12/27/2016 2108 12/19/16 0229 12/19/16 0822  TROPONINI 0.96* 2.11* 2.28*   No results for input(s): TROPIPOC in the last 168 hours.   BNPNo results for input(s): BNP, PROBNP in the last 168 hours.   DDimer No results for input(s): DDIMER in the last 168 hours.   Radiology    Dg Chest Port 1 View  Result Date: 12/19/2016 CLINICAL DATA:  Early termination of dialysis diminished lung sounds EXAM: PORTABLE CHEST 1 VIEW COMPARISON:  10/28/2016 FINDINGS: Post sternotomy changes and valvular prosthesis. Cardiomegaly is present. Central vascular congestion without overt edema. Possible small left pleural effusion. Atherosclerosis. No pneumothorax. IMPRESSION: Cardiomegaly with central vascular congestion. Possible small left  effusion. No overt pulmonary edema. Electronically Signed   By: Jasmine PangKim  Fujinaga M.D.   On: 12/19/2016 23:59    Cardiac Studies   Cardiac catheterization 12/24/2016 Conclusion   1. Severe 2 vessel CAD with total occlusion of the LAD and total occlusion of the RCA 2. S/P CABG with patency of the SVG-diag, critical stenosis of the SVG-PDA, and non-visualization of the LIMA-LAD 3. Successful PCI of the SVG-PDA with a DES platform  Recommend:  Load with Brilinta 180 mg when patient able to swallow medicine  Continue aggrastat at reduced dose until patient takes brilinta  DAPT with ASA/brilinta as tolerated at least 6 months. If she needs to switch to a less-potent thienopyridine for vascular surgery she could be switched to plavix.    Echocardiogram 10/31/16 - Left ventricle: The cavity size was normal. Wall thickness was   increased in a pattern of moderate LVH. Systolic function was   normal. The estimated ejection fraction was in the range of 60%   to 65%. Wall motion was normal; there were no regional wall   motion abnormalities. Features are consistent with a pseudonormal   left ventricular filling pattern, with concomitant abnormal   relaxation and increased filling pressure (grade 2 diastolic   dysfunction). - Aortic valve: Bioprosthesis in aortic position. There was trivial   regurgitation. Mean gradient (S): 29 mm Hg. Peak gradient (S): 51   mm Hg. VTI ratio of LVOT to aortic valve: 0.4. - Mitral valve: Calcified annulus. Mildly thickened leaflets .   There was mild to moderate regurgitation. - Left atrium: The atrium was severely dilated. - Tricuspid valve: There was trivial regurgitation. - Pulmonary arteries: Systolic pressure could not be accurately   estimated. - Pericardium, extracardiac: There was no pericardial effusion.  Impressions:  - Moderate LVH with LVEF 60-65% and grade 2 diastolic dysfunction.   Severe left atrial enlargement. Moderately calcified mitral    annulus with thickened leaflets and mild to moderate mitral   regurgitation. 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   Trivial aortic regurgitation noted. Gradients have increased   compared to the prior study with mean gradient 29 mmHg, although   the dimensionless index does not suggest prosthetic valve   stenosis. Trivial tricuspid regurgitation, unable to assess PASP.  Patient Profile  76 y.o. female here with non-ST elevation myocardial infarction status post cardiac catheterization with SVG to PDA DES stent placement, post CABG 2011 with bioprosthetic aortic valve replacement, end-stage renal disease on hemodialysis, severe peripheral vascular disease with critical limb ischemia of left foot with ulcerated wounds, status post left upper arm AV fistula followed by Dr. Arbie Cookey,   Assessment & Plan    Non-ST elevation myocardial infarction  - SVG to PDA stent placement, DES.  - Dual antiplatelet therapy for at least 6 months  - Continue with Brilinta for now, can switch to Plavix if necessary in the future. Please see catheterization note.  - Aggressive secondary prevention, aspirin, Brilinta, atorvastatin, carvedilol  End-stage renal disease with hemodialysis  - Left upper arm fistula  - Fluid status per HD.   - Appreciate Nepho team.   Peripheral vascular disease with critical limb ischemia  - Per Dr. Arbie Cookey  - Non-ST elevation myocardial infarction has slightly delayed angiography of lower extremity.  - I would be comfortable with attempting angiography once again on Monday 12/21/16 on dual antiplatelet therapy allowing her time to reach steady state with her dual antiplatelet therapy.  - Per review of office visit, Dr. early on 12/15/16, she is at high risk for limb amputation.  Hyperlipidemia  - Atorvastatin 80  Bioprosthetic aortic valve replacement 2011  - Antibiotic prophylaxis if necessary  - Recent echocardiogram in December 2017 showed normal function  Coronary  artery disease status post CABG 2011  - See cardiac catheterization report as above.  - SVG to PDA stenting.  Delirium  - TRH consult. Confusion. Anxiety  Hypotension  - We will cancel losartan 50 mg and amlodipine for now  - Hold carvedilol  Anemia  - Hemoglobin 7.9 on 01/01/2017.  - Blood products were given during hemodialysis, Dr. Arlean Hopping. Approx 1 unit given   Signed, Donato Schultz, MD  12/20/2016, 8:16 AM

## 2016-12-21 ENCOUNTER — Encounter (HOSPITAL_COMMUNITY): Payer: Self-pay | Admitting: Cardiovascular Disease

## 2016-12-21 LAB — HEMOGLOBIN A1C
HEMOGLOBIN A1C: 4.4 % — AB (ref 4.8–5.6)
MEAN PLASMA GLUCOSE: 80 mg/dL

## 2016-12-21 MED ORDER — OLANZAPINE 5 MG PO TABS: 5.0000 mg | ORAL_TABLET | Freq: Every day | ORAL | Status: DC

## 2016-12-21 NOTE — Progress Notes (Signed)
Subjective: Interval History: none.. Agitated last night. More comfortable this morning. Reports that she slept in the recliner and slept well. Denying any pain currently  Objective: Vital signs in last 24 hours: Temp:  [97.7 F (36.5 C)-98.8 F (37.1 C)] 97.7 F (36.5 C) (02/12 0422) Pulse Rate:  [57-74] 57 (02/12 0422) Resp:  [14-19] 14 (02/12 0422) BP: (132-156)/(39-47) 152/39 (02/12 0422) SpO2:  [95 %-98 %] 98 % (02/12 0422) Weight:  [157 lb 11.2 oz (71.5 kg)] 157 lb 11.2 oz (71.5 kg) (02/12 0422)  Intake/Output from previous day: 02/11 0701 - 02/12 0700 In: 603 [P.O.:600; I.V.:3] Out: 250 [Urine:250] Intake/Output this shift: No intake/output data recorded.  Continued ecchymosis and some hematoma in his right groin. gangrenous changes on the tips of her left foot. Second and fourth toes most ischemic. Great toenail is loose which has been the case. Full-thickness ulcer on the medial aspect of her left distal calf. Superficial ulcerations over both lower extremities.  Lab Results:  Recent Labs  12/19/16 0822 12/20/16 0205  WBC 13.3* 14.5*  HGB 7.2* 8.4*  HCT 21.9* 25.5*  PLT 271 262   BMET  Recent Labs  12/19/16 0229 12/19/16 0822  NA 137 135  K 3.7 3.9  CL 98* 101  CO2 26 22  GLUCOSE 73 72  BUN 30* 33*  CREATININE 3.76* 4.11*  CALCIUM 8.1* 7.9*    Studies/Results: Dg Chest Port 1 View  Result Date: 12/19/2016 CLINICAL DATA:  Shani Fitch termination of dialysis diminished lung sounds EXAM: PORTABLE CHEST 1 VIEW COMPARISON:  10/28/2016 FINDINGS: Post sternotomy changes and valvular prosthesis. Cardiomegaly is present. Central vascular congestion without overt edema. Possible small left pleural effusion. Atherosclerosis. No pneumothorax. IMPRESSION: Cardiomegaly with central vascular congestion. Possible small left effusion. No overt pulmonary edema. Electronically Signed   By: Jasmine PangKim  Fujinaga M.D.   On: 12/19/2016 23:59   Anti-infectives: Anti-infectives    None       Assessment/Plan: s/p Procedure(s): Left Heart Cath and Cors/Grafts Angiography (N/A) Coronary Stent Intervention (N/A) Long discussion with the patient and multiple family members present. Will need to arteriogram and hopeful enter vascular ability to improve flow to her left foot. Very poor surgical candidate. More difficult now her admission with acute MI emergent stenting. Will tentatively plan for arteriogram and possible intervention on Wednesday. For her regular dialysis tomorrow   LOS: 3 days   Elaysia Devargas 12/21/2016, 11:51 AM

## 2016-12-21 NOTE — NC FL2 (Signed)
[place] MEDICAID FL2 LEVEL OF CARE SCREENING TOOL     IDENTIFICATION  Patient Name: Jill Johnson Birthdate: 03/13/1941 Sex: female Admission Date (Current Location): 12/14/2016  [facility] [place]   Facility and Address:  [facility], [address], [place], [place] [address]      Provider Number: [id number]  Attending Physician Name and Address:  Lennette Biharihomas A Kelly, MD  Relative Name and Phone Number:       Current Level of Care: Hospital Recommended Level of Care: Skilled Nursing Facility Prior Approval Number:    Date Approved/Denied:   PASRR Number:  [id number] [id number])  Discharge Plan: SNF    Current Diagnoses: Patient Active Problem List   Diagnosis Date Noted  . Unstable angina (HCC) Nov 25, 2016  . Anemia, chronic disease Nov 25, 2016  . ESRD on hemodialysis (HCC) Nov 25, 2016  . PAD (peripheral artery disease) (HCC) Nov 25, 2016  . Critical lower limb ischemia Nov 25, 2016  . Coronary artery disease involving native coronary artery of native heart with unstable angina pectoris (HCC) Nov 25, 2016  . Chest pain 10/28/2016  . Fluid overload 10/28/2016  . Elevated troponin 10/28/2016  . Symptomatic anemia 10/28/2016  . ESRD (end stage renal disease) (HCC) 10/28/2016  . Hypothyroidism 10/28/2016  . Respiratory distress 10/28/2016  . Acute diastolic heart failure (HCC) 12/20/2015  . Obesity 12/20/2015  . Coronary artery disease due to lipid rich plaque 12/20/2015  . S/P AVR (aortic valve replacement) 12/20/2015  . Chronic kidney disease, stage 3 12/20/2015    Orientation RESPIRATION BLADDER Height & Weight     Self, Time, Situation, Place  Normal Continent Weight: 157 lb 11.2 oz (71.5 kg) Height:  5' (152.4 cm)  BEHAVIORAL SYMPTOMS/MOOD NEUROLOGICAL BOWEL NUTRITION STATUS   (none)  (none) Continent Diet (Heart Healthy )  AMBULATORY STATUS COMMUNICATION OF NEEDS Skin   Extensive Assist Verbally Other (Comment) (non-pressure  wound: R Leg, Diabetic Foot Ulcer: L Foot)                       Personal Care Assistance Level of Assistance  Bathing, Dressing, Feeding Bathing Assistance: Limited assistance Feeding assistance: Independent Dressing Assistance: Limited assistance     Functional Limitations Info  Speech, Hearing, Sight Sight Info: Adequate Hearing Info: Adequate Speech Info: Adequate    SPECIAL CARE FACTORS FREQUENCY  PT (By licensed PT)     PT Frequency: 3              Contractures      Additional Factors Info  Code Status, Allergies Code Status Info: FULL CODE  Allergies Info: Prednisone           Current Medications (12/21/2016):  This is the current hospital active medication list Current Facility-Administered Medications  Medication Dose Route Frequency Provider Last Rate Last Dose  . 0.9 %  sodium chloride infusion  250 mL Intravenous PRN Joline Salthonda G Barrett, PA-C   Stopped at 12/19/16 1200  . 0.9 %  sodium chloride infusion  250 mL Intravenous PRN Tonny BollmanMichael Cooper, MD      . 0.9 %  sodium chloride infusion  250 mL Intravenous PRN Tonny BollmanMichael Cooper, MD      . 0.9 %  sodium chloride infusion   Intravenous Once Delano Metzobert Schertz, MD      . acetaminophen (TYLENOL) tablet 1,000 mg  1,000 mg Oral TID Alberteen Samhristopher P Danford, MD   1,000 mg at 12/21/16 0858  . aspirin chewable tablet 81 mg  81 mg Oral  Daily Tonny Bollman, MD   81 mg at 12/21/16 0858  . atorvastatin (LIPITOR) tablet 80 mg  80 mg Oral q1800 Rhonda G Barrett, PA-C   80 mg at 12/20/16 1709  . calcium acetate (PHOSLO) capsule 667 mg  667 mg Oral TID WC Tonny Bollman, MD   667 mg at 12/21/16 1207  . carvedilol (COREG) tablet 3.125 mg  3.125 mg Oral BID WC Rhonda G Barrett, PA-C   3.125 mg at 12/21/16 0858  . cholecalciferol (VITAMIN D) tablet 2,000 Units  2,000 Units Oral Daily Tonny Bollman, MD   2,000 Units at 12/21/16 1208  . dextromethorphan-guaiFENesin (MUCINEX DM) 30-600 MG per 12 hr tablet 1 tablet  1 tablet Oral Daily  PRN Lennette Bihari, MD      . gabapentin (NEURONTIN) capsule 200 mg  200 mg Oral BID Alberteen Sam, MD   200 mg at 12/21/16 0857  . levothyroxine (SYNTHROID, LEVOTHROID) tablet 112 mcg  112 mcg Oral QAC breakfast Tonny Bollman, MD   112 mcg at 12/21/16 0857  . MEDLINE mouth rinse  15 mL Mouth Rinse BID [name], MD   15 mL at 12/21/16 0859  . nitroGLYCERIN (NITROSTAT) SL tablet 0.4 mg  0.4 mg Sublingual Q5 Min x 3 PRN Rhonda G Barrett, PA-C      . ondansetron (ZOFRAN) injection 4 mg  4 mg Intravenous Q6H PRN Rhonda G Barrett, PA-C      . oxyCODONE (Oxy IR/ROXICODONE) immediate release tablet 5 mg  5 mg Oral Q4H PRN Alberteen Sam, MD   5 mg at 12/20/16 2031  . senna-docusate (Senokot-S) tablet 1 tablet  1 tablet Oral QHS PRN Tonny Bollman, MD   1 tablet at 12/19/16 2103  . sodium chloride flush (NS) 0.9 % injection 3 mL  3 mL Intravenous Q12H Rhonda G Barrett, PA-C      . sodium chloride flush (NS) 0.9 % injection 3 mL  3 mL Intravenous PRN Rhonda G Barrett, PA-C      . sodium chloride flush (NS) 0.9 % injection 3 mL  3 mL Intravenous Q12H Tonny Bollman, MD   3 mL at 12/20/16 2125  . sodium chloride flush (NS) 0.9 % injection 3 mL  3 mL Intravenous PRN Tonny Bollman, MD      . sodium chloride flush (NS) 0.9 % injection 3 mL  3 mL Intravenous Q12H Tonny Bollman, MD   3 mL at 12/21/16 0859  . sodium chloride flush (NS) 0.9 % injection 3 mL  3 mL Intravenous PRN Tonny Bollman, MD      . ticagrelor [facility]) tablet 90 mg  90 mg Oral BID Tonny Bollman, MD   90 mg at 12/21/16 0858  . vitamin B-12 (CYANOCOBALAMIN) tablet 1,500 mcg  1,500 mcg Oral Daily Lennette Bihari, MD   1,500 mcg at 12/21/16 1207     Discharge Medications: Please see discharge summary for a list of discharge medications.  Relevant Imaging Results:  Relevant Lab Results:   Additional Information SSN [id number]  Vaughan Browner

## 2016-12-21 NOTE — Progress Notes (Signed)
[facility] PROGRESS NOTE  Jill Johnson:[id number] DOB: 02-18-1941 DOA: 01/02/2017   PCP: Hollice Espy, MD     Assessment/Plan: Principal Problem:   Unstable angina (HCC) Active Problems:   S/P AVR (aortic valve replacement)   Anemia, chronic disease   ESRD on hemodialysis (HCC)   Critical lower limb ischemia   Coronary artery disease involving native coronary artery of native heart with unstable angina pectoris (HCC)   Jill Johnson is an 76 y.o. female with ESRD on HD started 2 mo ago, CAD s/p CABG and AVR, hypothyroidism, HTN, and recent critical limb ischemia who we are asked to evaluate for delirium.  The patient has been having rest pain in her left leg/foot the last month, saw Dr. Arbie Cookey in clinic and was planned to have outpatient angiography of that leg two days ago.  In short stay prior to the procedure she was having chest pain, ECG showed new ST depressions and so she was taken to the cath lab where one of her grafts was stented with a DES.  Since then, her hospital stay has been complicated by confusion.  Per nursing staff, the patient's family states that she is often more confused and anxious/tearful at night when in the hospital.  Yesterday during the day, she was somewhat confused and agitated.  Late in the day Xanax was given, as well as quite a bit of morphine for her pain (from her MAR I see four doses of oxycodone yesterday, four doses of morphine in last 48 hours, nightly alprazolam, no Ambien given although it is ordered PRN).  Per nursing, she only slept a few hours last night, and was otherwise tearful and agitated the whole night.  Per patient, she was quite confused yesterday and last night, thought she was "in a fishbowl", and mostly complains that her pain at night is unbearable and "driving me crazy."    Assessment and plan 1. Delirium: Infection versus ESRD versus prolonged hospital stay with multiple comorbidities ICU setting and  uncontrolled pain puts this patient at HIGH risk of delirium, so this is to some extent expected.  She has a rising WBC, low-grade fever , no tachycardia at this time. Check UA,  -Defer antibiotic treatment of possible cellulitis to primary team and Vascular Surgery, as I am not convinced this is infected Chest x-ray 2/10 showed cardiomegaly with central congestion Delirium precautions:              -At night, make sure lights and TV off, minimize interruptions             -During day: blinds open and lights on             -Glasses/hearing aid with patient if she has them             -Frequent reorientation             -Treat constipation as able             -PT/OT when able, early ambulation             -Avoid sedation medications/Beers list medications  In particular, regarding sedating medications: avoid alprazolam, Ambien, morphine (these are stopped)             -In ESRD, titrated down gabapentin                 -Try to use oxycodone as sparingly as possible             -  as a last resort for agitation if not improved , will add low-dose Zyprexa, if no improvement .       DVT prophylaxsis per cardiology  Code Status:  Full code    Family Communication: Discussed in detail with the patient, all imaging results, lab results explained to the patient   Disposition Plan:  Per cardiology     Consultants:  [facility]  Cardiology  Nephrology    Procedures:  None  Antibiotics: Anti-infectives    None         HPI/Subjective: Patient states that she is significantly better. She has Alzheimer's. But she was significantly more confused yesterday. She attributes it to narcotics  Objective: Vitals:   12/20/16 1133 12/20/16 1521 12/20/16 1938 12/21/16 0422  BP: (!) 123/45 (!) 132/42 (!) 156/47 (!) 152/39  Pulse:  64 74 (!) 57  Resp: 15 16 19 14   Temp: 99.1 F (37.3 C) 98.8 F (37.1 C) 98.3 F (36.8 C) 97.7 F (36.5 C)  TempSrc: Oral Oral    SpO2: 99% 95% 96% 98%   Weight:    71.5 kg (157 lb 11.2 oz)  Height:        Intake/Output Summary (Last 24 hours) at 12/21/16 1041 Last data filed at 12/21/16 [id number]  Gross per 24 hour  Intake              363 ml  Output              250 ml  Net              113 ml    Exam:  Examination:  General exam: Appears calm and comfortable  Respiratory system: Clear to auscultation. Respiratory effort normal. Cardiovascular system: S1 & S2 heard, RRR. No JVD, murmurs, rubs, gallops or clicks. No pedal edema. Gastrointestinal system: Abdomen is nondistended, soft and nontender. No organomegaly or masses felt. Normal bowel sounds heard. Central nervous system: Alert and oriented. No focal neurological deficits. Extremities: Symmetric 5 x 5 power. Skin: No rashes, lesions or ulcers Psychiatry: Judgement and insight appear normal. Mood & affect appropriate.     Data Reviewed: I have personally reviewed following labs and imaging studies  Micro Results Recent Results (from the past 240 hour(s))  MRSA PCR Screening     Status: None   Collection Time: 11-02-2017  8:18 PM  Result Value Ref Range Status   MRSA by PCR NEGATIVE NEGATIVE Final    Comment:        The GeneXpert MRSA Assay (FDA approved for NASAL specimens only), is one component of a comprehensive MRSA colonization surveillance program. It is not intended to diagnose MRSA infection nor to guide or monitor treatment for MRSA infections.     Radiology Reports Dg Chest Port 1 View  Result Date: 12/19/2016 CLINICAL DATA:  Early termination of dialysis diminished lung sounds EXAM: PORTABLE CHEST 1 VIEW COMPARISON:  10/28/2016 FINDINGS: Post sternotomy changes and valvular prosthesis. Cardiomegaly is present. Central vascular congestion without overt edema. Possible small left pleural effusion. Atherosclerosis. No pneumothorax. IMPRESSION: Cardiomegaly with central vascular congestion. Possible small left effusion. No overt pulmonary edema.  Electronically Signed   By: Jasmine PangKim  Fujinaga M.D.   On: 12/19/2016 23:59     CBC  Recent Labs Lab 11-02-2017 1230 11-02-2017 1547 12/19/16 0822 12/20/16 0205  WBC  --  10.1 13.3* 14.5*  HGB 8.5* 7.9* 7.2* 8.4*  HCT 25.0* 24.7* 21.9* 25.5*  PLT  --  283 271 262  MCV  --  100.4* 100.0 94.8  MCH  --  32.1 32.9 31.2  MCHC  --  32.0 32.9 32.9  RDW  --  16.2* 16.2* 17.4*    Chemistries   Recent Labs Lab 12/24/2016 1230 12/19/16 0229 12/19/16 0822  NA 135 137 135  K 3.4* 3.7 3.9  CL 98* 98* 101  CO2  --  26 22  GLUCOSE 98 73 72  BUN 28* 30* 33*  CREATININE 3.50* 3.76* 4.11*  CALCIUM  --  8.1* 7.9*  AST  --  37  --   ALT  --  17  --   ALKPHOS  --  59  --   BILITOT  --  0.6  --    ------------------------------------------------------------------------------------------------------------------ estimated creatinine clearance is 10.1 mL/min (by C-G formula based on SCr of 4.11 mg/dL (H)). ------------------------------------------------------------------------------------------------------------------  Recent Labs  12/15/2016 2108  HGBA1C 4.4*   ------------------------------------------------------------------------------------------------------------------  Recent Labs  12/19/16 0229  CHOL 135  HDL 32*  LDLCALC 80  TRIG 454  CHOLHDL 4.2   ------------------------------------------------------------------------------------------------------------------ No results for input(s): TSH, T4TOTAL, T3FREE, THYROIDAB in the last 72 hours.  Invalid input(s): FREET3 ------------------------------------------------------------------------------------------------------------------ No results for input(s): VITAMINB12, FOLATE, FERRITIN, TIBC, IRON, RETICCTPCT in the last 72 hours.  Coagulation profile  Recent Labs Lab 12/13/2016 1547  INR 1.18    No results for input(s): DDIMER in the last 72 hours.  Cardiac Enzymes  Recent Labs Lab 01/03/2017 2108 12/19/16 0229  12/19/16 0822  TROPONINI 0.96* 2.11* 2.28*   ------------------------------------------------------------------------------------------------------------------ Invalid input(s): POCBNP   CBG:  Recent Labs Lab 12/20/16 1943  GLUCAP 138*       Studies: Dg Chest Port 1 View  Result Date: 12/19/2016 CLINICAL DATA:  Early termination of dialysis diminished lung sounds EXAM: PORTABLE CHEST 1 VIEW COMPARISON:  10/28/2016 FINDINGS: Post sternotomy changes and valvular prosthesis. Cardiomegaly is present. Central vascular congestion without overt edema. Possible small left pleural effusion. Atherosclerosis. No pneumothorax. IMPRESSION: Cardiomegaly with central vascular congestion. Possible small left effusion. No overt pulmonary edema. Electronically Signed   By: Jasmine Pang M.D.   On: 12/19/2016 23:59      Lab Results  Component Value Date   HGBA1C 4.4 (L) 12/25/2016   HGBA1C (H) 01/22/2010    6.5 (NOTE) The ADA recommends the following therapeutic goal for glycemic control related to Hgb A1c measurement: Goal of therapy: <6.5 Hgb A1c  Reference: American Diabetes Association: Clinical Practice Recommendations 2010, Diabetes Care, 2010, 33: (Suppl  1).   Lab Results  Component Value Date   LDLCALC 80 12/19/2016   CREATININE 4.11 (H) 12/19/2016       Scheduled Meds: . sodium chloride   Intravenous Once  . acetaminophen  1,000 mg Oral TID  . aspirin  81 mg Oral Daily  . atorvastatin  80 mg Oral q1800  . calcium acetate  667 mg Oral TID WC  . carvedilol  3.125 mg Oral BID WC  . cholecalciferol  2,000 Units Oral Daily  . gabapentin  200 mg Oral BID  . levothyroxine  112 mcg Oral QAC breakfast  . mouth rinse  15 mL Mouth Rinse BID  . sodium chloride flush  3 mL Intravenous Q12H  . sodium chloride flush  3 mL Intravenous Q12H  . sodium chloride flush  3 mL Intravenous Q12H  . ticagrelor  90 mg Oral BID  . vitamin B-12  1,500 mcg Oral Daily   Continuous Infusions:    LOS: 3 days    Time spent: >30 MINS  [facility]  [facility] Pager [phone]. If 7PM-7AM, please contact night-coverage at [email or web address], password [facility] 12/21/2016, 10:41 AM  LOS: 3 days

## 2016-12-21 NOTE — Progress Notes (Signed)
Progress Note  Patient Name: Jill Johnson Date of Encounter: 12/21/2016  Primary Cardiologist: Anne FuSkains  Subjective   Sleepy this AM, no CP, no SOB. Confused. Mild temp now resolved.  Post cath. Nurse reported anxiety overnight, could not calm her down. Family could not calm her down.   Inpatient Medications    Scheduled Meds: . sodium chloride   Intravenous Once  . acetaminophen  1,000 mg Oral TID  . aspirin  81 mg Oral Daily  . atorvastatin  80 mg Oral q1800  . calcium acetate  667 mg Oral TID WC  . carvedilol  3.125 mg Oral BID WC  . cholecalciferol  2,000 Units Oral Daily  . gabapentin  200 mg Oral BID  . levothyroxine  112 mcg Oral QAC breakfast  . mouth rinse  15 mL Mouth Rinse BID  . sodium chloride flush  3 mL Intravenous Q12H  . sodium chloride flush  3 mL Intravenous Q12H  . sodium chloride flush  3 mL Intravenous Q12H  . ticagrelor  90 mg Oral BID  . vitamin B-12  1,500 mcg Oral Daily   Continuous Infusions:  PRN Meds: sodium chloride, sodium chloride, sodium chloride, dextromethorphan-guaiFENesin, magnesium hydroxide, nitroGLYCERIN, ondansetron (ZOFRAN) IV, oxyCODONE, senna-docusate, sodium chloride flush, sodium chloride flush, sodium chloride flush   Vital Signs    Vitals:   12/20/16 1133 12/20/16 1521 12/20/16 1938 12/21/16 0422  BP: (!) 123/45 (!) 132/42 (!) 156/47 (!) 152/39  Pulse:  64 74 (!) 57  Resp: 15 16 19 14   Temp: 99.1 F (37.3 C) 98.8 F (37.1 C) 98.3 F (36.8 C) 97.7 F (36.5 C)  TempSrc: Oral Oral    SpO2: 99% 95% 96% 98%  Weight:    157 lb 11.2 oz (71.5 kg)  Height:        Intake/Output Summary (Last 24 hours) at 12/21/16 0740 Last data filed at 12/21/16 [id number]  Gross per 24 hour  Intake              603 ml  Output              250 ml  Net              353 ml   Filed Weights   12/19/16 1624 12/20/16 0500 12/21/16 0422  Weight: 161 lb 9.6 oz (73.3 kg) 155 lb 13.8 oz (70.7 kg) 157 lb 11.2 oz (71.5 kg)    Telemetry    NSR,  no VT - Personally Reviewed  ECG    ST depression lateral improved - Personally Reviewed  Physical Exam   Affect appropriate Chronically ill white female  HEENT: normal Neck supple with no adenopathy JVP normal no bruits no thyromegaly Lungs clear with no wheezing and good diaphragmatic motion Heart:  S1/S2 no murmur, no rub, gallop or click PMI normal LUE fistual  Abdomen: benighn, BS positve, no tenderness, no AAA no bruit.  No HSM or HJR Unable to palpate distal pulses with dressings on both shins  No edema Neuro non-focal Skin warm and dry    Labs    Chemistry  Recent Labs Lab 12/20/2016 1230 12/19/16 0229 12/19/16 0822  NA 135 137 135  K 3.4* 3.7 3.9  CL 98* 98* 101  CO2  --  26 22  GLUCOSE 98 73 72  BUN 28* 30* 33*  CREATININE 3.50* 3.76* 4.11*  CALCIUM  --  8.1* 7.9*  PROT  --  5.4*  --   ALBUMIN  --  2.4*  --   AST  --  37  --   ALT  --  17  --   ALKPHOS  --  59  --   BILITOT  --  0.6  --   GFRNONAA  --  11* 10*  GFRAA  --  12* 11*  ANIONGAP  --  13 12     Hematology  Recent Labs Lab 12/30/2016 1547 12/19/16 0822 12/20/16 0205  WBC 10.1 13.3* 14.5*  RBC 2.46* 2.19* 2.69*  HGB 7.9* 7.2* 8.4*  HCT 24.7* 21.9* 25.5*  MCV 100.4* 100.0 94.8  MCH 32.1 32.9 31.2  MCHC 32.0 32.9 32.9  RDW 16.2* 16.2* 17.4*  PLT 283 271 262    Cardiac Enzymes  Recent Labs Lab 01/04/2017 2108 12/19/16 0229 12/19/16 0822  TROPONINI 0.96* 2.11* 2.28*   No results for input(s): TROPIPOC in the last 168 hours.   BNPNo results for input(s): BNP, PROBNP in the last 168 hours.   DDimer No results for input(s): DDIMER in the last 168 hours.   Radiology    Dg Chest Port 1 View  Result Date: 12/19/2016 CLINICAL DATA:  Early termination of dialysis diminished lung sounds EXAM: PORTABLE CHEST 1 VIEW COMPARISON:  10/28/2016 FINDINGS: Post sternotomy changes and valvular prosthesis. Cardiomegaly is present. Central vascular congestion without overt edema. Possible  small left pleural effusion. Atherosclerosis. No pneumothorax. IMPRESSION: Cardiomegaly with central vascular congestion. Possible small left effusion. No overt pulmonary edema. Electronically Signed   By: Jasmine Pang M.D.   On: 12/19/2016 23:59    Cardiac Studies   Cardiac catheterization 01/06/2017 Conclusion   1. Severe 2 vessel CAD with total occlusion of the LAD and total occlusion of the RCA 2. S/P CABG with patency of the SVG-diag, critical stenosis of the SVG-PDA, and non-visualization of the LIMA-LAD 3. Successful PCI of the SVG-PDA with a DES platform  Recommend:  Load with Brilinta 180 mg when patient able to swallow medicine  Continue aggrastat at reduced dose until patient takes brilinta  DAPT with ASA/brilinta as tolerated at least 6 months. If she needs to switch to a less-potent thienopyridine for vascular surgery she could be switched to plavix.    Echocardiogram 10/31/16 - Left ventricle: The cavity size was normal. Wall thickness was   increased in a pattern of moderate LVH. Systolic function was   normal. The estimated ejection fraction was in the range of 60%   to 65%. Wall motion was normal; there were no regional wall   motion abnormalities. Features are consistent with a pseudonormal   left ventricular filling pattern, with concomitant abnormal   relaxation and increased filling pressure (grade 2 diastolic   dysfunction). - Aortic valve: Bioprosthesis in aortic position. There was trivial   regurgitation. Mean gradient (S): 29 mm Hg. Peak gradient (S): 51   mm Hg. VTI ratio of LVOT to aortic valve: 0.4. - Mitral valve: Calcified annulus. Mildly thickened leaflets .   There was mild to moderate regurgitation. - Left atrium: The atrium was severely dilated. - Tricuspid valve: There was trivial regurgitation. - Pulmonary arteries: Systolic pressure could not be accurately   estimated. - Pericardium, extracardiac: There was no pericardial  effusion.  Impressions:  - Moderate LVH with LVEF 60-65% and grade 2 diastolic dysfunction.   Severe left atrial enlargement. Moderately calcified mitral   annulus with thickened leaflets and mild to moderate mitral   regurgitation. 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   Trivial aortic regurgitation noted. Gradients have  increased   compared to the prior study with mean gradient 29 mmHg, although   the dimensionless index does not suggest prosthetic valve   stenosis. Trivial tricuspid regurgitation, unable to assess PASP.  Patient Profile     76 y.o. female here with non-ST elevation myocardial infarction status post cardiac catheterization with SVG to PDA DES stent placement, post CABG 2011 with bioprosthetic aortic valve replacement, end-stage renal disease on hemodialysis, severe peripheral vascular disease with critical limb ischemia of left foot with ulcerated wounds, status post left upper arm AV fistula followed by Dr. Arbie Cookey,   Assessment & Plan    Non-ST elevation myocardial infarction  - SVG to PDA stent placement, DES.  - Dual antiplatelet therapy for at least 6 months  - Continue with Brilinta for now, can switch to Plavix if necessary in the future. Please see catheterization note.  - Aggressive secondary prevention, aspirin, Brilinta, atorvastatin, carvedilol  End-stage renal disease with hemodialysis  - Left upper arm fistula  - Fluid status per HD.   - Appreciate Nepho team.   Peripheral vascular disease with critical limb ischemia  - for angiogram in OR today   Hyperlipidemia  - Atorvastatin 80  Bioprosthetic aortic valve replacement 2011  - Antibiotic prophylaxis if necessary  - Recent echocardiogram in December 2017 showed normal function  Coronary artery disease status post CABG 2011  - See cardiac catheterization report as above.  - SVG to PDA stenting. DAT   Delirium  - TRH consult. Confusion. Anxiety  Hypotension  - We will cancel  losartan 50 mg and amlodipine for now  - Hold carvedilol  Anemia  - Hemoglobin 7.9 on 12/22/2016.  - Blood products were given during hemodialysis, Dr. Arlean Hopping. Approx 1 unit given   At this point primary care issues per [facility], Renal and Vascular   Signed, Charlton Haws, MD  12/21/2016, 7:40 AM

## 2016-12-21 NOTE — Progress Notes (Signed)
[facility] Progress Note   Subjective: For possible angio today in the OR ; still having significant LLE pain.  Appears anxious  Vitals:   12/20/16 1133 12/20/16 1521 12/20/16 1938 12/21/16 0422  BP: (!) 123/45 (!) 132/42 (!) 156/47 (!) 152/39  Pulse:  64 74 (!) 57  Resp: 15 16 19 14   Temp: 99.1 F (37.3 C) 98.8 F (37.1 C) 98.3 F (36.8 C) 97.7 F (36.5 C)  TempSrc: Oral Oral    SpO2: 99% 95% 96% 98%  Weight:    71.5 kg (157 lb 11.2 oz)  Height:        Inpatient medications: . sodium chloride   Intravenous Once  . acetaminophen  1,000 mg Oral TID  . aspirin  81 mg Oral Daily  . atorvastatin  80 mg Oral q1800  . calcium acetate  667 mg Oral TID WC  . carvedilol  3.125 mg Oral BID WC  . cholecalciferol  2,000 Units Oral Daily  . gabapentin  200 mg Oral BID  . levothyroxine  112 mcg Oral QAC breakfast  . mouth rinse  15 mL Mouth Rinse BID  . sodium chloride flush  3 mL Intravenous Q12H  . sodium chloride flush  3 mL Intravenous Q12H  . sodium chloride flush  3 mL Intravenous Q12H  . ticagrelor  90 mg Oral BID  . vitamin B-12  1,500 mcg Oral Daily    sodium chloride, sodium chloride, sodium chloride, dextromethorphan-guaiFENesin, magnesium hydroxide, nitroGLYCERIN, ondansetron (ZOFRAN) IV, oxyCODONE, senna-docusate, sodium chloride flush, sodium chloride flush, sodium chloride flush  Exam: Gen alert, obese, anxious appearing NECK No jvd or bruits PULM Chest clear bilat CV RRR III/VI harsh systolic murmur RUSB Abd soft ntnd no mass or ascites +bs      Ext 1+ LLE pretib edema / +2 necrotic skin wounds L lower leg; dressed today.  Painful to touch Neuro is alert, Ox 3 , nf LUA AVF +infiltrations + bruit  CXR 2/10 - vasc congestion  Dialysis: TTS NW 4h  67kg   2/2.25 bath  Hep 2000  LUA AVF   16ga needles (does not want 15 ga)  - darbe 200/ wk last 2/8  - venofer 100 mg/ hd, one dose left  - last Hb 8, Ca 9, P 5.8, pth 251  - tums 1 ac, cozaar 50  hs  Assessment: 1.  Acute MI - sp PCI to SVG-PDA 2/9. Started on MoosupBrilanta; on DAPT 2.  ESRD on HD TTS - started HD 6 wks ago, LUA AVF w infiltrations > 1. May need TDC if persists. Plan for HD tomorrow 3.  PAD / L leg ulcers - per VVS, sig disease LLE; angio today  4.  HTN/ vol - on coreg only here 5.  Hx CABG/ bio-AVR 2011 6.  Anemia of CKD - Hb 7.2 today, was 8 at OP HD so not far off. Cont max darbe, complete Fe load. Next esa due on 2/15.   7.  MBD - Phoslo 2 capsules AC before meals    Plan - cont HD TTS   Bufford ButtnerElizabeth Alfred Harrel MD [facility] pgr [phone] 12/21/2016, 8:29 AM    Recent Labs Lab Apr 22, 2017 1230 12/19/16 0229 12/19/16 0822  NA 135 137 135  K 3.4* 3.7 3.9  CL 98* 98* 101  CO2  --  26 22  GLUCOSE 98 73 72  BUN 28* 30* 33*  CREATININE 3.50* 3.76* 4.11*  CALCIUM  --  8.1* 7.9*  Recent Labs Lab 12/19/16 0229  AST 37  ALT 17  ALKPHOS 59  BILITOT 0.6  PROT 5.4*  ALBUMIN 2.4*    Recent Labs Lab December 26, 2016 1547 12/19/16 0822 12/20/16 0205  WBC 10.1 13.3* 14.5*  HGB 7.9* 7.2* 8.4*  HCT 24.7* 21.9* 25.5*  MCV 100.4* 100.0 94.8  PLT 283 271 262   Iron/TIBC/Ferritin/ %Sat    Component Value Date/Time   IRON 49 10/20/2016 0929   TIBC 354 10/20/2016 0929   FERRITIN 387 (H) 10/20/2016 0929   IRONPCTSAT 14 10/20/2016 0929

## 2016-12-21 NOTE — Plan of Care (Signed)
Problem: Education: Goal: Knowledge of [facility] Education information/materials will improve Outcome: Progressing Reviewed PRN medications for pain with patient, pt verbalized understanding of 1-10 pain rating scale.

## 2016-12-22 ENCOUNTER — Encounter (HOSPITAL_COMMUNITY): Payer: Self-pay | Admitting: General Practice

## 2016-12-22 LAB — CBC
HCT: 25.2 % — ABNORMAL LOW (ref 36.0–46.0)
HEMOGLOBIN: 8.6 g/dL — AB (ref 12.0–15.0)
MCH: 32.5 pg (ref 26.0–34.0)
MCHC: 34.1 g/dL (ref 30.0–36.0)
MCV: 95.1 fL (ref 78.0–100.0)
Platelets: 283 10*3/uL (ref 150–400)
RBC: 2.65 MIL/uL — AB (ref 3.87–5.11)
RDW: 17.2 % — ABNORMAL HIGH (ref 11.5–15.5)
WBC: 12 10*3/uL — ABNORMAL HIGH (ref 4.0–10.5)

## 2016-12-22 LAB — RENAL FUNCTION PANEL
ALBUMIN: 2.3 g/dL — AB (ref 3.5–5.0)
ANION GAP: 12 (ref 5–15)
BUN: 46 mg/dL — ABNORMAL HIGH (ref 6–20)
CALCIUM: 8 mg/dL — AB (ref 8.9–10.3)
CO2: 23 mmol/L (ref 22–32)
CREATININE: 5.29 mg/dL — AB (ref 0.44–1.00)
Chloride: 92 mmol/L — ABNORMAL LOW (ref 101–111)
GFR calc non Af Amer: 7 mL/min — ABNORMAL LOW (ref >60)
GFR, EST AFRICAN AMERICAN: 8 mL/min — AB (ref >60)
GLUCOSE: 104 mg/dL — AB (ref 65–99)
PHOSPHORUS: 4.7 mg/dL — AB (ref 2.5–4.6)
Potassium: 4.1 mmol/L (ref 3.5–5.1)
Sodium: 127 mmol/L — ABNORMAL LOW (ref 135–145)

## 2016-12-22 MED ORDER — OXYCODONE HCL 5 MG PO TABS
ORAL_TABLET | ORAL | Status: AC
  Filled 2016-12-22: qty 1

## 2016-12-22 MED ORDER — CEFAZOLIN IN D5W 1 GM/50ML IV SOLN
1.0000 g | INTRAVENOUS | Status: AC
  Filled 2016-12-22: qty 50

## 2016-12-22 MED ORDER — SUCROFERRIC OXYHYDROXIDE 500 MG PO CHEW
500.0000 mg | CHEWABLE_TABLET | Freq: Three times a day (TID) | ORAL | Status: DC
  Administered 2016-12-22 – 2016-12-27 (×10): 500 mg via ORAL
  Filled 2016-12-22 (×19): qty 1

## 2016-12-22 MED ORDER — ACETAMINOPHEN 500 MG PO TABS
ORAL_TABLET | ORAL | Status: AC
  Filled 2016-12-22: qty 2

## 2016-12-22 NOTE — Procedures (Signed)
Patient seen and examined on Hemodialysis. In pain, pain meds somewhat helping. QB 400, UF goal 3 liters Treatment adjusted as needed.  Bufford ButtnerElizabeth Tyreese Thain MD [facility] pgr [phone] 10:28 AM

## 2016-12-22 NOTE — Progress Notes (Signed)
Pt complained of pain in her lower extremities this afternoon. Pt offered prn oxy multiple times, pt refused. Will cont to monitor pt.

## 2016-12-22 NOTE — Progress Notes (Signed)
Pt's dressing changed per order.

## 2016-12-22 NOTE — Progress Notes (Signed)
[facility] Progress Note   Subjective: Anxious and agitated.  Still in quite a bit of pain.  Vitals:   12/22/16 0930 12/22/16 0945 12/22/16 1000 12/22/16 1015  BP: (!) 155/56 (!) 143/56 (!) 136/50 (!) 157/60  Pulse: 70 65 66 66  Resp:      Temp:      TempSrc:      SpO2:      Weight:      Height:        Inpatient medications: . sodium chloride   Intravenous Once  . acetaminophen  1,000 mg Oral TID  . aspirin  81 mg Oral Daily  . atorvastatin  80 mg Oral q1800  . calcium acetate  667 mg Oral TID WC  . carvedilol  3.125 mg Oral BID WC  . [START ON 12/22/2016]  ceFAZolin (ANCEF) IV  1 g Intravenous To OR  . cholecalciferol  2,000 Units Oral Daily  . gabapentin  200 mg Oral BID  . levothyroxine  112 mcg Oral QAC breakfast  . mouth rinse  15 mL Mouth Rinse BID  . sodium chloride flush  3 mL Intravenous Q12H  . sodium chloride flush  3 mL Intravenous Q12H  . sodium chloride flush  3 mL Intravenous Q12H  . ticagrelor  90 mg Oral BID  . vitamin B-12  1,500 mcg Oral Daily    sodium chloride, sodium chloride, sodium chloride, dextromethorphan-guaiFENesin, nitroGLYCERIN, ondansetron (ZOFRAN) IV, oxyCODONE, senna-docusate, sodium chloride flush, sodium chloride flush, sodium chloride flush  Exam: Gen alert, obese, anxious appearing NECK No jvd or bruits PULM bilateral faint expiratory rhonchi CV RRR III/VI harsh systolic murmur RUSB Abd soft ntnd no mass or ascites +bs      Ext 1+ LLE pretib edema / +2 necrotic skin wounds L lower leg; dressed today.  Painful to touch Neuro is alert, Ox 3 , nf LUA AVF +infiltrations + bruit, no difficulty cannulating today   Dialysis: TTS NW 4h  67kg   2/2.25 bath  Hep 2000  LUA AVF   16ga needles (does not want 15 ga)  - darbe 200/ wk last 2/8  - venofer 100 mg/ hd, one dose left  - last Hb 8, Ca 9, P 5.8, pth 251  - tums 1 ac, cozaar 50 hs  Assessment: 1.  Acute MI - sp PCI to SVG-PDA 2/9. Started on [place]; on  DAPT 2.  ESRD on HD TTS - started HD 6 wks ago, LUA AVF not difficult to cannulate today.  HD on schedule today.  She is having a hard time coping with dialysis- pall care as below.  Her LLE pain is expectedly worse during dialysis and if there are no options to mitigate critical limb ischemia it may be appropriate to discuss shifting focus of care to symptom management/ comfort rather than aggressive care. 3.  PAD / L leg ulcers - per VVS, sig disease LLE; will have tenative arteriogram in OR tomorrow- but her operative options are limited, and I fully agree with pall care consult.   4.  HTN/ vol - on coreg only here 5.  Hx CABG/ bio-AVR 2011 6.  Anemia of CKD - Hb 7.2 today, was 8 at OP HD so not far off. Cont max darbe, complete Fe load. Next esa due on 2/15.   7.  MBD - Phoslo 2 capsules AC before meals- in setting of bad atherosclerosis, changing to velphoro 1 AC TID    Bufford Buttner MD [facility]  pgr [phone] 12/22/2016, 10:18 AM    Recent Labs Lab 12/19/16 0229 12/19/16 0822 12/22/16 0739  NA 137 135 127*  K 3.7 3.9 4.1  CL 98* 101 92*  CO2 26 22 23   GLUCOSE 73 72 104*  BUN 30* 33* 46*  CREATININE 3.76* 4.11* 5.29*  CALCIUM 8.1* 7.9* 8.0*  PHOS  --   --  4.7*    Recent Labs Lab 12/19/16 0229 12/22/16 0739  AST 37  --   ALT 17  --   ALKPHOS 59  --   BILITOT 0.6  --   PROT 5.4*  --   ALBUMIN 2.4* 2.3*    Recent Labs Lab 12/19/16 0822 12/20/16 0205 12/22/16 0739  WBC 13.3* 14.5* 12.0*  HGB 7.2* 8.4* 8.6*  HCT 21.9* 25.5* 25.2*  MCV 100.0 94.8 95.1  PLT 271 262 283   Iron/TIBC/Ferritin/ %Sat    Component Value Date/Time   IRON 49 10/20/2016 0929   TIBC 354 10/20/2016 0929   FERRITIN 387 (H) 10/20/2016 0929   IRONPCTSAT 14 10/20/2016 0929

## 2016-12-22 NOTE — Progress Notes (Addendum)
Subjective: Interval History: none.. Currently in hemodialysis. Very agitated and confused. Asking when she is going to dialysis and repeatedly explained that she was in hemodialysis and receiving dialysis currently. Reports severe pain in her left hip and left foot. If occult to console.  Objective: Vital signs in last 24 hours: Temp:  [97.5 F (36.4 C)-98.2 F (36.8 C)] 97.5 F (36.4 C) (02/13 0730) Pulse Rate:  [50-67] 65 (02/13 0815) Resp:  [16] 16 (02/13 0730) BP: (95-153)/(39-57) 95/41 (02/13 0815) SpO2:  [96 %-98 %] 98 % (02/13 0730) Weight:  [158 lb (71.7 kg)-160 lb 4.4 oz (72.7 kg)] 160 lb 4.4 oz (72.7 kg) (02/13 0730)  Intake/Output from previous day: 02/12 0701 - 02/13 0700 In: 720 [P.O.:720] Out: 200 [Urine:200] Intake/Output this shift: No intake/output data recorded.  No change. In her physical exam. Right groin bruising stable without any increased ecchymosis.  Lab Results:  Recent Labs  12/20/16 0205 12/22/16 0739  WBC 14.5* 12.0*  HGB 8.4* 8.6*  HCT 25.5* 25.2*  PLT 262 283   BMET  Recent Labs  12/22/16 0739  NA 127*  K 4.1  CL 92*  CO2 23  GLUCOSE 104*  BUN 46*  CREATININE 5.29*  CALCIUM 8.0*    Studies/Results: Dg Chest Port 1 View  Result Date: 12/19/2016 CLINICAL DATA:  Nur Krasinski termination of dialysis diminished lung sounds EXAM: PORTABLE CHEST 1 VIEW COMPARISON:  10/28/2016 FINDINGS: Post sternotomy changes and valvular prosthesis. Cardiomegaly is present. Central vascular congestion without overt edema. Possible small left pleural effusion. Atherosclerosis. No pneumothorax. IMPRESSION: Cardiomegaly with central vascular congestion. Possible small left effusion. No overt pulmonary edema. Electronically Signed   By: Jasmine PangKim  Fujinaga M.D.   On: 12/19/2016 23:59   Anti-infectives: Anti-infectives    Start     Dose/Rate Route Frequency Ordered Stop   01/03/2017 0000  ceFAZolin (ANCEF) IVPB 1 g/50 mL premix    Comments:  Send with pt to OR   1  g 100 mL/hr over 30 Minutes Intravenous On call 12/22/16 0815 12/24/16 0000      Assessment/Plan: s/p Procedure(s): Left Heart Cath and Cors/Grafts Angiography (N/A) Coronary Stent Intervention (N/A) Extremely difficult management problem. Severe confusion. Agitation. Tentatively plan for arteriogram tomorrow in the operating room to allow sedation. Critical limb ischemia of left foot with limited options regarding operative repair in her current medical status.  Will defer to primary service but would strongly consider palliative care consult to discuss pain management and goals of care and expectations with the patient and her family.  LOS: 4 days   Gretta Beganarly, Jearld Hemp 12/22/2016, 8:44 AM

## 2016-12-22 NOTE — Progress Notes (Addendum)
Patient ID: Jill Johnson, female   DOB: Sep 10, 1941, 76 y.o.   MRN: [id number]  PROGRESS NOTE    Jill Johnson  BMW:[id number] DOB: [birth year] DOA: 06-Jan-2017  PCP: Hollice Espy, MD   Brief Narrative:  76 year old female with past medical history significant for end-stage renal disease on hemodialysis (TTS) started about 2 months ago, CAD status post CABG and AVR (AV replacement), hypothyroidism, hypertension, recent critical limb ischemia. Initially under cardiology primary care and [facility] consulted for evaluation of delirium. Please note patient under [facility] primary care starting 12/22/2016.  Patient has had left leg/foot pain for past month or so prior to this admission. She saw Dr. early in clinic and the plan was for outpatient angiography. In short stay, prior to the procedure, patient started to have chest pain and EKG showed new ST depressions. Patient was taken to cath lab on Jan 06, 2017. She is status post cardiac catheterization with SVG to PDA DES stent placement.   Assessment & Plan:   Non-ST elevation myocardial infarction  - SVG to PDA stent placement, DES.  - Dual antiplatelet therapy for at least 6 months  - Continue with Brilinta for now, aspirin  - Aggressive secondary prevention with as noted asa and brilinta as well as atorvastatin, carvedilol  End-stage renal disease with hemodialysis - Left upper arm fistula - Management per renal   Peripheral vascular disease with critical limb ischemia - Per Dr. Arbie Cookey pt is very poor surgical candidate - Will tentatively plan for arteriogram and possible intervention on Wednesday  Hyperlipidemia - Continue statin therapy   Bioprosthetic aortic valve replacement 2011  - Recent echocardiogram in December 2017 showed normal function  Coronary artery disease status post CABG 2011  - SVG to PDA stenting  Anemia of chronic kidney disease - Hgb 8.6, stable   Hyponatremia - Sodium 127 this am - Will  continue to monitor daily BMP  Delirium - Unclear etiology - Remains disoriented, confused  - Continue to monitor   Hypothyroidism  - Continue synthroid   Bilateral LEs and left foot toes pressure injury  - Left medial LE:  3.5 x 2 with depth obscured by the presence of eschar, no exudate - Left lateral LE:  3cm x 1cm x 0.1cm partial thickness with pink, moist base and scant serous exudate - Right LE (anterior):  2.5cm x 1.5cm x 0.2cm full thickness wound with light yellow, moist wound bed and small amount of serous exudate - Dressing procedure/placement/frequency: Conservative wound care using betadine swab sticks to the left toes with eschar in an effort to keep them dry and infection free.  Prevalon Boots are provided bilaterally to protect from injury.  The LE wounds care twice daily with saline moistened gauze and topped with protective silicone foam.     DVT prophylaxis: Asa, brilinta  Code Status: full code  Family Communication: no family at the bedside this am Disposition Plan: plan for arteriogram Wednesday    Consultants:   Cardiology   Nephrology   Vascular surgery   Palliative care   WOC  Procedures:   ESDR on HD TTS  Antimicrobials:   None    Subjective: No overnight events.   Objective: Vitals:   12/22/16 1015 12/22/16 1030 12/22/16 1105 12/22/16 1140  BP: (!) 157/60 (!) 153/56 (!) 130/52 (!) 104/39  Pulse: 66 70 71 70  Resp:   15 17  Temp:   97.5 F (36.4 C) 97.5 F (36.4 C)  TempSrc:   Oral Oral  SpO2:  98% 98%  Weight:   70.1 kg (154 lb 8.7 oz)   Height:        Intake/Output Summary (Last 24 hours) at 12/22/16 1200 Last data filed at 12/22/16 1105  Gross per 24 hour  Intake              720 ml  Output             2057 ml  Net            -1337 ml   Filed Weights   12/22/16 0500 12/22/16 0730 12/22/16 1105  Weight: 71.7 kg (158 lb) 72.7 kg (160 lb 4.4 oz) 70.1 kg (154 lb 8.7 oz)    Examination:  General exam: Appears calm  and comfortable  Respiratory system: Clear to auscultation. Respiratory effort normal. Cardiovascular system: S1 & S2 heard, Rate controlled  Gastrointestinal system: Abdomen is nondistended, soft and nontender. No organomegaly or masses felt. Normal bowel sounds heard. Central nervous system: No focal neurological deficits. Extremities: Symmetric 5 x 5 power. Skin: ecchymoses inner thigh, has skin injury to both anterior shins with pink foam dressing on top Psychiatry: Disoriented, not restless   Data Reviewed: I have personally reviewed following labs and imaging studies  CBC:  Recent Labs Lab 2017-02-07 1230 2017-02-07 1547 12/19/16 0822 12/20/16 0205 12/22/16 0739  WBC  --  10.1 13.3* 14.5* 12.0*  HGB 8.5* 7.9* 7.2* 8.4* 8.6*  HCT 25.0* 24.7* 21.9* 25.5* 25.2*  MCV  --  100.4* 100.0 94.8 95.1  PLT  --  283 271 262 283   Basic Metabolic Panel:  Recent Labs Lab 2017-02-07 1230 12/19/16 0229 12/19/16 0822 12/22/16 0739  NA 135 137 135 127*  K 3.4* 3.7 3.9 4.1  CL 98* 98* 101 92*  CO2  --  26 22 23   GLUCOSE 98 73 72 104*  BUN 28* 30* 33* 46*  CREATININE 3.50* 3.76* 4.11* 5.29*  CALCIUM  --  8.1* 7.9* 8.0*  PHOS  --   --   --  4.7*   GFR: Estimated Creatinine Clearance: 7.8 mL/min (by C-G formula based on SCr of 5.29 mg/dL (H)). Liver Function Tests:  Recent Labs Lab 12/19/16 0229 12/22/16 0739  AST 37  --   ALT 17  --   ALKPHOS 59  --   BILITOT 0.6  --   PROT 5.4*  --   ALBUMIN 2.4* 2.3*   No results for input(s): LIPASE, AMYLASE in the last 168 hours. No results for input(s): AMMONIA in the last 168 hours. Coagulation Profile:  Recent Labs Lab 2017-02-07 1547  INR 1.18   Cardiac Enzymes:  Recent Labs Lab 2017-02-07 2108 12/19/16 0229 12/19/16 0822  TROPONINI 0.96* 2.11* 2.28*   BNP (last 3 results) No results for input(s): PROBNP in the last 8760 hours. HbA1C: No results for input(s): HGBA1C in the last 72 hours. CBG:  Recent Labs Lab  12/20/16 1943  GLUCAP 138*   Lipid Profile: No results for input(s): CHOL, HDL, LDLCALC, TRIG, CHOLHDL, LDLDIRECT in the last 72 hours. Thyroid Function Tests: No results for input(s): TSH, T4TOTAL, FREET4, T3FREE, THYROIDAB in the last 72 hours. Anemia Panel: No results for input(s): VITAMINB12, FOLATE, FERRITIN, TIBC, IRON, RETICCTPCT in the last 72 hours. Urine analysis:    Component Value Date/Time   COLORURINE YELLOW 10/28/2016 1600   APPEARANCEUR CLEAR 10/28/2016 1600   LABSPEC 1.011 10/28/2016 1600   PHURINE 6.5 10/28/2016 1600   GLUCOSEU NEGATIVE 10/28/2016 1600   HGBUR NEGATIVE  10/28/2016 1600   BILIRUBINUR NEGATIVE 10/28/2016 1600   KETONESUR NEGATIVE 10/28/2016 1600   PROTEINUR 30 (A) 10/28/2016 1600   UROBILINOGEN 1.0 01/30/2010 1104   NITRITE NEGATIVE 10/28/2016 1600   LEUKOCYTESUR NEGATIVE 10/28/2016 1600   Sepsis Labs: @LABRCNTIP (procalcitonin:4,lacticidven:4)    Recent Results (from the past 240 hour(s))  MRSA PCR Screening     Status: None   Collection Time: 2016/12/27  8:18 PM  Result Value Ref Range Status   MRSA by PCR NEGATIVE NEGATIVE Final      Radiology Studies: Dg Chest Port 1 View Result Date: 12/19/2016 Cardiomegaly with central vascular congestion. Possible small left effusion. No overt pulmonary edema.    Scheduled Meds: . acetaminophen  1,000 mg Oral TID  . aspirin  81 mg Oral Daily  . atorvastatin  80 mg Oral q1800  . carvedilol  3.125 mg Oral BID WC  .  ceFAZolin IV  1 g Intravenous To OR  . cholecalciferol  2,000 Units Oral Daily  . gabapentin  200 mg Oral BID  . mouth rinse  15 mL Mouth Rinse BID  . sucroferric oxyhydroxide  500 mg Oral TID WC  . ticagrelor  90 mg Oral BID  . vitamin B-12  1,500 mcg Oral Daily   Continuous Infusions:   LOS: 4 days    Time spent: 25 minutes  Greater than 50% of the time spent on counseling and coordinating the care.   Manson Passey, MD [facility] Pager [phone]  If  7PM-7AM, please contact night-coverage [email or web address] Password [email or web address] 12/22/2016, 12:00 PM

## 2016-12-22 NOTE — Clinical Social Work Placement (Signed)
   CLINICAL SOCIAL WORK PLACEMENT  NOTE  Date:  12/22/2016  Patient Details  Name: Jill Johnson MRN: [id number] Date of Birth: [birth year]  Clinical Social Work is seeking post-discharge placement for this patient at the Skilled  Nursing Facility level of care (*CSW will initial, date and re-position this form in  chart as items are completed):  Yes   Patient/family provided with [facility] Clinical Social Work Department's list of facilities offering this level of care within the geographic area requested by the patient (or if unable, by the patient's family).  Yes   Patient/family informed of their freedom to choose among providers that offer the needed level of care, that participate in Medicare, Medicaid or managed care program needed by the patient, have an available bed and are willing to accept the patient.  Yes   Patient/family informed of [employer]'s ownership interest in [facility] and [facility], as well as of the fact that they are under no obligation to receive care at these facilities.  PASRR submitted to EDS on       PASRR number received on       Existing PASRR number confirmed on 12/21/16     FL2 transmitted to all facilities in geographic area requested by pt/family on 12/22/16     FL2 transmitted to all facilities within larger geographic area on       Patient informed that his/her managed care company has contracts with or will negotiate with certain facilities, including the following:            Patient/family informed of bed offers received.  Patient chooses bed at       Physician recommends and patient chooses bed at      Patient to be transferred to   on  .  Patient to be transferred to facility by       Patient family notified on   of transfer.  Name of family member notified:        PHYSICIAN Please prepare priority discharge summary, including medications, Please prepare prescriptions, Please sign FL2     MD please order PT/OT  evaluations once medically appropriate. We will need this as soon as possible for insurance authorization.  Additional Comment:   _______________________________________________ Venita Lickampbell, Annissa Andreoni B, LCSW 12/22/2016, 6:14 PM

## 2016-12-22 NOTE — Progress Notes (Signed)
Progress Note  Patient Name: Jill Johnson Date of Encounter: 12/22/2016  Primary Cardiologist: Anne Fu  Subjective   Seen on hemodialysis she is complaining of left leg pain otherwise tolerating well.  Inpatient Medications    Scheduled Meds: . sodium chloride   Intravenous Once  . acetaminophen  1,000 mg Oral TID  . aspirin  81 mg Oral Daily  . atorvastatin  80 mg Oral q1800  . calcium acetate  667 mg Oral TID WC  . carvedilol  3.125 mg Oral BID WC  . [START ON 2017-01-03]  ceFAZolin (ANCEF) IV  1 g Intravenous On Call  . cholecalciferol  2,000 Units Oral Daily  . gabapentin  200 mg Oral BID  . levothyroxine  112 mcg Oral QAC breakfast  . mouth rinse  15 mL Mouth Rinse BID  . sodium chloride flush  3 mL Intravenous Q12H  . sodium chloride flush  3 mL Intravenous Q12H  . sodium chloride flush  3 mL Intravenous Q12H  . ticagrelor  90 mg Oral BID  . vitamin B-12  1,500 mcg Oral Daily   Continuous Infusions:  PRN Meds: sodium chloride, sodium chloride, sodium chloride, dextromethorphan-guaiFENesin, nitroGLYCERIN, ondansetron (ZOFRAN) IV, oxyCODONE, senna-docusate, sodium chloride flush, sodium chloride flush, sodium chloride flush   Vital Signs    Vitals:   12/22/16 0845 12/22/16 0915 12/22/16 0930 12/22/16 0945  BP: (!) 141/61 (!) 141/57 (!) 155/56 (!) 143/56  Pulse: 66 67 70 65  Resp:      Temp:      TempSrc:      SpO2:      Weight:      Height:        Intake/Output Summary (Last 24 hours) at 12/22/16 0949 Last data filed at 12/22/16 0649  Gross per 24 hour  Intake              720 ml  Output              200 ml  Net              520 ml   Filed Weights   12/21/16 0422 12/22/16 0500 12/22/16 0730  Weight: 157 lb 11.2 oz (71.5 kg) 158 lb (71.7 kg) 160 lb 4.4 oz (72.7 kg)    Telemetry    NSR with isolated PVC - Personally Reviewed  ECG    Last ECG 2/10 @0803 : Lateral ST depressions improved  Physical Exam   General: Chronically ill appearing  woman white female  HEENT: no JVD, no cervical LAN Cardiovascular: Holosystolic murmur present, no rub, gallop or click Respiratory: CTAB, normal WOB Abdomen: Nontender Extremities 1+ pedal edema, DP/PT pulses not readily palpable, left foot colder than right foot, dressings in place over both ankles and left foot, right groin hematoma without pain or swelling Psych: Oriented to person and place, affect is appropriate   Labs    Chemistry  Recent Labs Lab 12/19/16 0229 12/19/16 0822 12/22/16 0739  NA 137 135 127*  K 3.7 3.9 4.1  CL 98* 101 92*  CO2 26 22 23   GLUCOSE 73 72 104*  BUN 30* 33* 46*  CREATININE 3.76* 4.11* 5.29*  CALCIUM 8.1* 7.9* 8.0*  PROT 5.4*  --   --   ALBUMIN 2.4*  --  2.3*  AST 37  --   --   ALT 17  --   --   ALKPHOS 59  --   --   BILITOT 0.6  --   --  GFRNONAA 11* 10* 7*  GFRAA 12* 11* 8*  ANIONGAP 13 12 12      Hematology  Recent Labs Lab 12/19/16 0822 12/20/16 0205 12/22/16 0739  WBC 13.3* 14.5* 12.0*  RBC 2.19* 2.69* 2.65*  HGB 7.2* 8.4* 8.6*  HCT 21.9* 25.5* 25.2*  MCV 100.0 94.8 95.1  MCH 32.9 31.2 32.5  MCHC 32.9 32.9 34.1  RDW 16.2* 17.4* 17.2*  PLT 271 262 283    Radiology    No new results  Cardiac Studies   Cardiac catheterization 12/16/2016 Conclusion   1. Severe 2 vessel CAD with total occlusion of the LAD and total occlusion of the RCA 2. S/P CABG with patency of the SVG-diag, critical stenosis of the SVG-PDA, and non-visualization of the LIMA-LAD 3. Successful PCI of the SVG-PDA with a DES platform  Recommend:  Load with Brilinta 180 mg when patient able to swallow medicine  Continue aggrastat at reduced dose until patient takes brilinta  DAPT with ASA/brilinta as tolerated at least 6 months. If she needs to switch to a less-potent thienopyridine for vascular surgery she could be switched to plavix.    Echocardiogram 10/31/16 - Left ventricle: The cavity size was normal. Wall thickness was   increased in a  pattern of moderate LVH. Systolic function was   normal. The estimated ejection fraction was in the range of 60%   to 65%. Wall motion was normal; there were no regional wall   motion abnormalities. Features are consistent with a pseudonormal   left ventricular filling pattern, with concomitant abnormal   relaxation and increased filling pressure (grade 2 diastolic   dysfunction). - Aortic valve: Bioprosthesis in aortic position. There was trivial   regurgitation. Mean gradient (S): 29 mm Hg. Peak gradient (S): 51   mm Hg. VTI ratio of LVOT to aortic valve: 0.4. - Mitral valve: Calcified annulus. Mildly thickened leaflets .   There was mild to moderate regurgitation. - Left atrium: The atrium was severely dilated. - Tricuspid valve: There was trivial regurgitation. - Pulmonary arteries: Systolic pressure could not be accurately   estimated. - Pericardium, extracardiac: There was no pericardial effusion.  Impressions:  - Moderate LVH with LVEF 60-65% and grade 2 diastolic dysfunction.   Severe left atrial enlargement. Moderately calcified mitral   annulus with thickened leaflets and mild to moderate mitral   regurgitation. 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   Trivial aortic regurgitation noted. Gradients have increased   compared to the prior study with mean gradient 29 mmHg, although   the dimensionless index does not suggest prosthetic valve   stenosis. Trivial tricuspid regurgitation, unable to assess PASP.  Patient Profile     76 y.o. female here with non-ST elevation myocardial infarction status post cardiac catheterization with SVG to PDA DES stent placement, post CABG 2011 with bioprosthetic aortic valve replacement, end-stage renal disease on hemodialysis, severe peripheral vascular disease with critical limb ischemia of left foot with ulcerated wounds, status post left upper arm AV fistula followed by Dr. Arbie Cookey,   Assessment & Plan   Non-ST elevation myocardial  infarction  - SVG to PDA stent placement on 2/9, DES.  - Dual antiplatelet therapy for at least 6 months  - Continue Brilinta, can switch to Plavix if necessary in the future. Please see catheterization note.  - Aggressive secondary prevention, aspirin, Brilinta, atorvastatin, carvedilol  End-stage renal disease with hemodialysis  - Left upper arm fistula  - Fluid status per HD.   - Appreciate Nephrology team management.  Peripheral vascular disease with critical limb ischemia - Current plan for angiogram in OR tomorrow - Vascular surgery Dr. Arbie CookeyEarly managing  Bioprosthetic aortic valve replacement 2011  - Antibiotic prophylaxis if necessary for vascular interventions  - December 2017 TTE showed normal function  Coronary artery disease status post CABG 2011  - See cardiac catheterization report as above.  - SVG to PDA stenting. DAT ASA/brilinta  Hyperlipidemia  - Atorvastatin 80  Delirium - She seems less confused today - TRH consulted, recommendations appreciated.  Hypotension  - We will cancel losartan 50 mg and amlodipine for now  - Still hold coreg with soft bp 95/41 reported o/n if all normal-high can resume  Anemia  - Hemoglobin 8.6 after transfusion 2/9 on HD  From a cardiology standpoint no significant management changes today. Her main active problem is severe resting leg pain w/ critical ischemia and ulceration. Primary care issues per [facility], Renal and Vascular   Fuller Planhristopher W Kinnick Maus, MD PGY-II Internal Medicine Resident Pager# [phone] 12/22/2016, 9:49 AM

## 2016-12-22 NOTE — Progress Notes (Signed)
Progress Note  Patient Name: Jill Johnson Date of Encounter: 12/22/2016  Primary Cardiologist: Anne Fu  Subjective   Agitated at dialysis  Leg pain persists   Inpatient Medications    Scheduled Meds: . sodium chloride   Intravenous Once  . acetaminophen  1,000 mg Oral TID  . aspirin  81 mg Oral Daily  . atorvastatin  80 mg Oral q1800  . calcium acetate  667 mg Oral TID WC  . carvedilol  3.125 mg Oral BID WC  . [START ON 12/21/2016]  ceFAZolin (ANCEF) IV  1 g Intravenous On Call  . cholecalciferol  2,000 Units Oral Daily  . gabapentin  200 mg Oral BID  . levothyroxine  112 mcg Oral QAC breakfast  . mouth rinse  15 mL Mouth Rinse BID  . sodium chloride flush  3 mL Intravenous Q12H  . sodium chloride flush  3 mL Intravenous Q12H  . sodium chloride flush  3 mL Intravenous Q12H  . ticagrelor  90 mg Oral BID  . vitamin B-12  1,500 mcg Oral Daily   Continuous Infusions:  PRN Meds: sodium chloride, sodium chloride, sodium chloride, dextromethorphan-guaiFENesin, nitroGLYCERIN, ondansetron (ZOFRAN) IV, oxyCODONE, senna-docusate, sodium chloride flush, sodium chloride flush, sodium chloride flush   Vital Signs    Vitals:   12/22/16 0845 12/22/16 0915 12/22/16 0930 12/22/16 0945  BP: (!) 141/61 (!) 141/57 (!) 155/56 (!) 143/56  Pulse: 66 67 70 65  Resp:      Temp:      TempSrc:      SpO2:      Weight:      Height:        Intake/Output Summary (Last 24 hours) at 12/22/16 0948 Last data filed at 12/22/16 1610  Gross per 24 hour  Intake              720 ml  Output              200 ml  Net              520 ml   Filed Weights   12/21/16 0422 12/22/16 0500 12/22/16 0730  Weight: 157 lb 11.2 oz (71.5 kg) 158 lb (71.7 kg) 160 lb 4.4 oz (72.7 kg)    Telemetry    NSR, no VT - Personally Reviewed  ECG    ST depression lateral improved - Personally Reviewed  Physical Exam   Affect appropriate Chronically ill white female  HEENT: normal Neck supple with no  adenopathy JVP normal no bruits no thyromegaly Lungs clear with no wheezing and good diaphragmatic motion Heart:  S1/S2 no murmur, no rub, gallop or click PMI normal LUE fistual  Abdomen: benighn, BS positve, no tenderness, no AAA no bruit.  No HSM or HJR Unable to palpate distal pulses with dressings on both shins  ecchymosis right groin gangrenous changes left foot 2/4th toes Superficial ulcers on both shins      Labs    Chemistry  Recent Labs Lab 12/19/16 0229 12/19/16 0822 12/22/16 0739  NA 137 135 127*  K 3.7 3.9 4.1  CL 98* 101 92*  CO2 26 22 23   GLUCOSE 73 72 104*  BUN 30* 33* 46*  CREATININE 3.76* 4.11* 5.29*  CALCIUM 8.1* 7.9* 8.0*  PROT 5.4*  --   --   ALBUMIN 2.4*  --  2.3*  AST 37  --   --   ALT 17  --   --   ALKPHOS 59  --   --  BILITOT 0.6  --   --   GFRNONAA 11* 10* 7*  GFRAA 12* 11* 8*  ANIONGAP 13 12 12      Hematology  Recent Labs Lab 12/19/16 0822 12/20/16 0205 12/22/16 0739  WBC 13.3* 14.5* 12.0*  RBC 2.19* 2.69* 2.65*  HGB 7.2* 8.4* 8.6*  HCT 21.9* 25.5* 25.2*  MCV 100.0 94.8 95.1  MCH 32.9 31.2 32.5  MCHC 32.9 32.9 34.1  RDW 16.2* 17.4* 17.2*  PLT 271 262 283    Cardiac Enzymes  Recent Labs Lab 12/19/2016 2108 12/19/16 0229 12/19/16 0822  TROPONINI 0.96* 2.11* 2.28*   No results for input(s): TROPIPOC in the last 168 hours.   BNPNo results for input(s): BNP, PROBNP in the last 168 hours.   DDimer No results for input(s): DDIMER in the last 168 hours.   Radiology    No results found.  Cardiac Studies   Cardiac catheterization 12/19/2016 Conclusion   1. Severe 2 vessel CAD with total occlusion of the LAD and total occlusion of the RCA 2. S/P CABG with patency of the SVG-diag, critical stenosis of the SVG-PDA, and non-visualization of the LIMA-LAD 3. Successful PCI of the SVG-PDA with a DES platform  Recommend:  Load with Brilinta 180 mg when patient able to swallow medicine  Continue aggrastat at reduced dose  until patient takes brilinta  DAPT with ASA/brilinta as tolerated at least 6 months. If she needs to switch to a less-potent thienopyridine for vascular surgery she could be switched to plavix.    Echocardiogram 10/31/16 - Left ventricle: The cavity size was normal. Wall thickness was   increased in a pattern of moderate LVH. Systolic function was   normal. The estimated ejection fraction was in the range of 60%   to 65%. Wall motion was normal; there were no regional wall   motion abnormalities. Features are consistent with a pseudonormal   left ventricular filling pattern, with concomitant abnormal   relaxation and increased filling pressure (grade 2 diastolic   dysfunction). - Aortic valve: Bioprosthesis in aortic position. There was trivial   regurgitation. Mean gradient (S): 29 mm Hg. Peak gradient (S): 51   mm Hg. VTI ratio of LVOT to aortic valve: 0.4. - Mitral valve: Calcified annulus. Mildly thickened leaflets .   There was mild to moderate regurgitation. - Left atrium: The atrium was severely dilated. - Tricuspid valve: There was trivial regurgitation. - Pulmonary arteries: Systolic pressure could not be accurately   estimated. - Pericardium, extracardiac: There was no pericardial effusion.  Impressions:  - Moderate LVH with LVEF 60-65% and grade 2 diastolic dysfunction.   Severe left atrial enlargement. Moderately calcified mitral   annulus with thickened leaflets and mild to moderate mitral   regurgitation. 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   Trivial aortic regurgitation noted. Gradients have increased   compared to the prior study with mean gradient 29 mmHg, although   the dimensionless index does not suggest prosthetic valve   stenosis. Trivial tricuspid regurgitation, unable to assess PASP.  Patient Profile     76 y.o. female here with non-ST elevation myocardial infarction status post cardiac catheterization with SVG to PDA DES stent placement, post  CABG 2011 with bioprosthetic aortic valve replacement, end-stage renal disease on hemodialysis, severe peripheral vascular disease with critical limb ischemia of left foot with ulcerated wounds, status post left upper arm AV fistula followed by Dr. Arbie Cookey,   Assessment & Plan    Non-ST elevation myocardial infarction  - SVG to PDA  stent placement, DES.  - Dual antiplatelet therapy for at least 6 months  - Continue with Brilinta for now, can switch to Plavix if necessary in the future. Please see catheterization note.  - Aggressive secondary prevention, aspirin, Brilinta, atorvastatin, carvedilol  End-stage renal disease with hemodialysis  - Left upper arm fistula  - Fluid status per HD.   - Appreciate Nepho team.   Peripheral vascular disease with critical limb ischemia  - for angiogram in OR tomorrow per [facility]    Hyperlipidemia  - Atorvastatin 80  Bioprosthetic aortic valve replacement 2011  - Antibiotic prophylaxis if necessary  - Recent echocardiogram in December 2017 showed normal function  Coronary artery disease status post CABG 2011  - See cardiac catheterization report as above.  - SVG to PDA stenting. DAT   Delirium  - TRH consult. Confusion. Anxiety  See note by Dr Arbie CookeyEarly this am suggest palliative care consult. Cardiology not adding Much to current care would appreciate [facility] to assume care and consider palliative Care consult   Signed, Charlton HawsPeter Rondell Pardon, MD  12/22/2016, 9:48 AM

## 2016-12-23 ENCOUNTER — Inpatient Hospital Stay (HOSPITAL_COMMUNITY): Payer: Medicare HMO | Admitting: Certified Registered Nurse Anesthetist

## 2016-12-23 ENCOUNTER — Encounter (HOSPITAL_COMMUNITY): Admission: AD | Disposition: E | Payer: Self-pay | Source: Ambulatory Visit | Attending: Internal Medicine

## 2016-12-23 ENCOUNTER — Encounter (HOSPITAL_COMMUNITY): Payer: Self-pay | Admitting: Certified Registered Nurse Anesthetist

## 2016-12-23 DIAGNOSIS — Z7189 Other specified counseling: Secondary | ICD-10-CM

## 2016-12-23 DIAGNOSIS — E038 Other specified hypothyroidism: Secondary | ICD-10-CM

## 2016-12-23 HISTORY — PX: AORTOGRAM: SHX6300

## 2016-12-23 LAB — CBC
HCT: 25.3 % — ABNORMAL LOW (ref 36.0–46.0)
HEMOGLOBIN: 8.4 g/dL — AB (ref 12.0–15.0)
MCH: 31.8 pg (ref 26.0–34.0)
MCHC: 33.2 g/dL (ref 30.0–36.0)
MCV: 95.8 fL (ref 78.0–100.0)
Platelets: 302 10*3/uL (ref 150–400)
RBC: 2.64 MIL/uL — ABNORMAL LOW (ref 3.87–5.11)
RDW: 17.6 % — ABNORMAL HIGH (ref 11.5–15.5)
WBC: 10.4 10*3/uL (ref 4.0–10.5)

## 2016-12-23 LAB — BASIC METABOLIC PANEL
Anion gap: 11 (ref 5–15)
BUN: 26 mg/dL — AB (ref 6–20)
CHLORIDE: 93 mmol/L — AB (ref 101–111)
CO2: 25 mmol/L (ref 22–32)
Calcium: 7.7 mg/dL — ABNORMAL LOW (ref 8.9–10.3)
Creatinine, Ser: 3.4 mg/dL — ABNORMAL HIGH (ref 0.44–1.00)
GFR calc Af Amer: 14 mL/min — ABNORMAL LOW (ref >60)
GFR calc non Af Amer: 12 mL/min — ABNORMAL LOW (ref >60)
GLUCOSE: 83 mg/dL (ref 65–99)
Potassium: 3.7 mmol/L (ref 3.5–5.1)
SODIUM: 129 mmol/L — AB (ref 135–145)

## 2016-12-23 LAB — PROTIME-INR
INR: 1.17
PROTHROMBIN TIME: 14.9 s (ref 11.4–15.2)

## 2016-12-23 LAB — SURGICAL PCR SCREEN
MRSA, PCR: NEGATIVE
Staphylococcus aureus: NEGATIVE

## 2016-12-23 SURGERY — AORTOGRAM
Anesthesia: General

## 2016-12-23 MED ORDER — FENTANYL CITRATE (PF) 250 MCG/5ML IJ SOLN
INTRAMUSCULAR | Status: AC
  Filled 2016-12-23: qty 5

## 2016-12-23 MED ORDER — SODIUM CHLORIDE 0.9% FLUSH: 3.0000 mL | INTRAVENOUS | Status: DC | PRN

## 2016-12-23 MED ORDER — OXYCODONE HCL ER 10 MG PO T12A
10.0000 mg | EXTENDED_RELEASE_TABLET | Freq: Two times a day (BID) | ORAL | Status: DC
  Administered 2016-12-23 – 2016-12-24 (×2): 10 mg via ORAL
  Filled 2016-12-23 (×2): qty 1

## 2016-12-23 MED ORDER — ALPRAZOLAM 0.25 MG PO TABS
0.2500 mg | ORAL_TABLET | Freq: Three times a day (TID) | ORAL | Status: DC | PRN
  Administered 2016-12-24 – 2017-01-02 (×9): 0.25 mg via ORAL
  Filled 2016-12-23 (×8): qty 1

## 2016-12-23 MED ORDER — FENTANYL CITRATE (PF) 100 MCG/2ML IJ SOLN
INTRAMUSCULAR | Status: AC
  Filled 2016-12-23: qty 2

## 2016-12-23 MED ORDER — SODIUM CHLORIDE 0.9 % IV SOLN: 250.0000 mL | INTRAVENOUS | Status: DC | PRN

## 2016-12-23 MED ORDER — HEPARIN SODIUM (PORCINE) 5000 UNIT/ML IJ SOLN
5000.0000 [IU] | Freq: Three times a day (TID) | INTRAMUSCULAR | Status: DC
  Administered 2016-12-24 – 2017-01-01 (×22): 5000 [IU] via SUBCUTANEOUS
  Filled 2016-12-23 (×22): qty 1

## 2016-12-23 MED ORDER — ACETAMINOPHEN 325 MG PO TABS
650.0000 mg | ORAL_TABLET | ORAL | Status: DC | PRN
  Administered 2016-12-26: 650 mg via ORAL
  Filled 2016-12-23 (×2): qty 2

## 2016-12-23 MED ORDER — SODIUM CHLORIDE 0.9% FLUSH
3.0000 mL | Freq: Two times a day (BID) | INTRAVENOUS | Status: DC
  Administered 2016-12-24 – 2017-01-01 (×15): 3 mL via INTRAVENOUS

## 2016-12-23 MED ORDER — MIRTAZAPINE 7.5 MG PO TABS
7.5000 mg | ORAL_TABLET | Freq: Every day | ORAL | Status: DC
  Administered 2016-12-23 – 2016-12-28 (×6): 7.5 mg via ORAL
  Filled 2016-12-23 (×6): qty 1

## 2016-12-23 MED ORDER — SODIUM CHLORIDE 0.9 % IV SOLN
INTRAVENOUS | Status: DC
  Administered 2016-12-23 (×2): via INTRAVENOUS

## 2016-12-23 MED ORDER — MIDAZOLAM HCL 2 MG/2ML IJ SOLN
INTRAMUSCULAR | Status: AC
  Filled 2016-12-23: qty 2

## 2016-12-23 MED ORDER — DARBEPOETIN ALFA 100 MCG/0.5ML IJ SOSY
100.0000 ug | PREFILLED_SYRINGE | INTRAMUSCULAR | Status: DC
  Administered 2016-12-24: 100 ug via INTRAVENOUS
  Filled 2016-12-23: qty 0.5

## 2016-12-23 MED ORDER — PRO-STAT SUGAR FREE PO LIQD
30.0000 mL | Freq: Two times a day (BID) | ORAL | Status: DC
  Administered 2016-12-25 – 2017-01-01 (×10): 30 mL via ORAL
  Filled 2016-12-23 (×12): qty 30

## 2016-12-23 MED ORDER — SENNA 8.6 MG PO TABS
1.0000 | ORAL_TABLET | Freq: Every day | ORAL | Status: DC
  Administered 2016-12-24 – 2016-12-28 (×4): 8.6 mg via ORAL
  Filled 2016-12-23 (×7): qty 1

## 2016-12-23 MED ORDER — ONDANSETRON HCL 4 MG/2ML IJ SOLN
INTRAMUSCULAR | Status: AC
  Filled 2016-12-23: qty 2

## 2016-12-23 MED ORDER — HEPARIN SODIUM (PORCINE) 1000 UNIT/ML IJ SOLN
INTRAMUSCULAR | Status: DC | PRN
  Administered 2016-12-23: 3 mL via INTRAVENOUS
  Administered 2016-12-23: 5 mL via INTRAVENOUS

## 2016-12-23 MED ORDER — PROPOFOL 10 MG/ML IV BOLUS
INTRAVENOUS | Status: AC
  Filled 2016-12-23: qty 20

## 2016-12-23 MED ORDER — IODIXANOL 320 MG/ML IV SOLN
INTRAVENOUS | Status: DC | PRN
  Administered 2016-12-23: 150 mL via INTRA_ARTERIAL

## 2016-12-23 MED ORDER — FENTANYL CITRATE (PF) 100 MCG/2ML IJ SOLN
50.0000 ug | Freq: Once | INTRAMUSCULAR | Status: AC
  Administered 2016-12-23: 50 ug via INTRAVENOUS

## 2016-12-23 MED ORDER — PROTAMINE SULFATE 10 MG/ML IV SOLN
INTRAVENOUS | Status: DC | PRN
  Administered 2016-12-23 (×2): 20 mg via INTRAVENOUS
  Administered 2016-12-23: 10 mg via INTRAVENOUS

## 2016-12-23 MED ORDER — PHENYLEPHRINE HCL 10 MG/ML IJ SOLN
INTRAMUSCULAR | Status: DC | PRN
  Administered 2016-12-23: 80 ug via INTRAVENOUS

## 2016-12-23 MED ORDER — FENTANYL CITRATE (PF) 100 MCG/2ML IJ SOLN: 25.0000 ug | INTRAMUSCULAR | Status: DC | PRN

## 2016-12-23 MED ORDER — PROPOFOL 10 MG/ML IV BOLUS
INTRAVENOUS | Status: DC | PRN
  Administered 2016-12-23: 60 mg via INTRAVENOUS

## 2016-12-23 MED ORDER — LIDOCAINE HCL (CARDIAC) 20 MG/ML IV SOLN
INTRAVENOUS | Status: DC | PRN
  Administered 2016-12-23: 40 mg via INTRAVENOUS

## 2016-12-23 MED ORDER — GLYCOPYRROLATE 0.2 MG/ML IJ SOLN
INTRAMUSCULAR | Status: DC | PRN
  Administered 2016-12-23: 0.3 mg via INTRAVENOUS

## 2016-12-23 MED ORDER — ONDANSETRON HCL 4 MG/2ML IJ SOLN
INTRAMUSCULAR | Status: DC | PRN
  Administered 2016-12-23: 4 mg via INTRAVENOUS

## 2016-12-23 MED ORDER — CEFAZOLIN SODIUM-DEXTROSE 2-4 GM/100ML-% IV SOLN
INTRAVENOUS | Status: AC
  Filled 2016-12-23: qty 100

## 2016-12-23 MED ORDER — HEPARIN SODIUM (PORCINE) 5000 UNIT/ML IJ SOLN
INTRAMUSCULAR | Status: DC | PRN
  Administered 2016-12-23: 500 mL

## 2016-12-23 MED ORDER — PHENYLEPHRINE HCL 10 MG/ML IJ SOLN
INTRAVENOUS | Status: DC | PRN
  Administered 2016-12-23: 20 ug/min via INTRAVENOUS

## 2016-12-23 MED ORDER — EPHEDRINE SULFATE 50 MG/ML IJ SOLN
INTRAMUSCULAR | Status: DC | PRN
  Administered 2016-12-23 (×2): 15 mg via INTRAVENOUS
  Administered 2016-12-23: 5 mg via INTRAVENOUS

## 2016-12-23 MED ORDER — CEFAZOLIN SODIUM-DEXTROSE 2-3 GM-% IV SOLR
INTRAVENOUS | Status: DC | PRN
  Administered 2016-12-23: 2 g via INTRAVENOUS

## 2016-12-23 SURGICAL SUPPLY — 53 items
ADH SKN CLS APL DERMABOND .7 (GAUZE/BANDAGES/DRESSINGS) ×1
BAG SNAP BAND KOVER 36X36 (MISCELLANEOUS) ×3 IMPLANT
BALLN MUSTANG 4X120X135 (BALLOONS) ×1 IMPLANT
BALLN MUSTANG 5X60X135 (BALLOONS) ×2
BALLOON MUSTANG 5X60X135 (BALLOONS) IMPLANT
BANDAGE ACE 4X5 VEL STRL LF (GAUZE/BANDAGES/DRESSINGS) IMPLANT
BANDAGE ESMARK 6X9 LF (GAUZE/BANDAGES/DRESSINGS) IMPLANT
BLADE SURG 11 STRL SS (BLADE) ×2 IMPLANT
BNDG CMPR 9X6 STRL LF SNTH (GAUZE/BANDAGES/DRESSINGS)
BNDG ESMARK 6X9 LF (GAUZE/BANDAGES/DRESSINGS)
CATH CROSS OVER TEMPO 5F (CATHETERS) ×1 IMPLANT
CATH OMNI FLUSH .035X70CM (CATHETERS) ×2 IMPLANT
CATH QUICKCROSS .035X135CM (MICROCATHETER) ×1 IMPLANT
CATH STRAIGHT 5FR 65CM (CATHETERS) ×1 IMPLANT
COVER DOME SNAP 22 D (MISCELLANEOUS) ×2 IMPLANT
COVER PROBE W GEL 5X96 (DRAPES) ×2 IMPLANT
COVER SURGICAL LIGHT HANDLE (MISCELLANEOUS) ×2 IMPLANT
DERMABOND ADVANCED (GAUZE/BANDAGES/DRESSINGS) ×1
DERMABOND ADVANCED .7 DNX12 (GAUZE/BANDAGES/DRESSINGS) ×1 IMPLANT
DEVICE TORQUE KENDALL .025-038 (MISCELLANEOUS) ×1 IMPLANT
DRSG TEGADERM 4X4.75 (GAUZE/BANDAGES/DRESSINGS) ×1 IMPLANT
ELECT REM PT RETURN 9FT ADLT (ELECTROSURGICAL)
ELECTRODE REM PT RTRN 9FT ADLT (ELECTROSURGICAL) IMPLANT
GAUZE SPONGE 4X4 12PLY STRL (GAUZE/BANDAGES/DRESSINGS) ×1 IMPLANT
GAUZE SPONGE 4X4 16PLY XRAY LF (GAUZE/BANDAGES/DRESSINGS) ×1 IMPLANT
GLOVE SS BIOGEL STRL SZ 7.5 (GLOVE) IMPLANT
GLOVE SUPERSENSE BIOGEL SZ 7.5 (GLOVE) ×2
GOWN STRL REUS W/ TWL LRG LVL3 (GOWN DISPOSABLE) ×2 IMPLANT
GOWN STRL REUS W/TWL LRG LVL3 (GOWN DISPOSABLE) ×8
GUIDEWIRE ANGLED .035X150CM (WIRE) ×1 IMPLANT
GUIDEWIRE ANGLED .035X260CM (WIRE) ×1 IMPLANT
KIT BASIN OR (CUSTOM PROCEDURE TRAY) ×2 IMPLANT
KIT ENCORE 26 ADVANTAGE (KITS) ×1 IMPLANT
KIT ROOM TURNOVER OR (KITS) ×2 IMPLANT
NDL PERC 18GX7CM (NEEDLE) ×1 IMPLANT
NEEDLE PERC 18GX7CM (NEEDLE) ×2 IMPLANT
NS IRRIG 1000ML POUR BTL (IV SOLUTION) IMPLANT
PAD ARMBOARD 7.5X6 YLW CONV (MISCELLANEOUS) ×4 IMPLANT
SHEATH AVANTI 11CM 5FR (MISCELLANEOUS) ×2 IMPLANT
SHEATH FLEXOR ANSEL 1 7F 45CM (SHEATH) ×1 IMPLANT
STENT VIABAHN 6X50X120 (Permanent Stent) ×1 IMPLANT
STOPCOCK MORSE 400PSI 3WAY (MISCELLANEOUS) ×2 IMPLANT
SYR 20CC LL (SYRINGE) ×2 IMPLANT
SYR 30ML LL (SYRINGE) ×2 IMPLANT
SYR 3ML LL SCALE MARK (SYRINGE) ×2 IMPLANT
SYR MEDRAD MARK V 150ML (SYRINGE) ×2 IMPLANT
SYRINGE 10CC LL (SYRINGE) ×3 IMPLANT
TUBING HIGH PRESSURE 120CM (CONNECTOR) ×2 IMPLANT
WIRE BENTSON .035X145CM (WIRE) ×2 IMPLANT
WIRE HI TORQ VERSACORE J 260CM (WIRE) ×1 IMPLANT
WIRE ROSEN 145CM (WIRE) ×1 IMPLANT
WIRE ROSEN-J .035X260CM (WIRE) ×1 IMPLANT
WIRE SPARTACORE .014X300CM (WIRE) ×1 IMPLANT

## 2016-12-23 NOTE — Clinical Social Work Note (Signed)
Clinical Social Work Assessment  Patient Details  Name: Jill Johnson MRN: [id number] Date of Birth: 16-Feb-1941  Date of referral:  12/21/16               Reason for consult:  Discharge Planning, Facility Placement                Permission sought to share information with:  Facility Sport and exercise psychologist, Family Supports Permission granted to share information::  Yes, Verbal Permission Granted  Name::     Airline pilot  Agency::  [facility] SNFs  Relationship::  Sons  Contact Information:     Housing/Transportation Living arrangements for the past 2 months:  [place] of Information:  Patient, Adult Children Patient Interpreter Needed:  None Criminal Activity/Legal Involvement Pertinent to Current Situation/Hospitalization:  No - Comment as needed Significant Relationships:  Adult Children Lives with:  Self Do you feel safe going back to the place where you live?  No Need for family participation in patient care:  Yes (Comment)  Care giving concerns:  The patient and family are concerned about where the patient will go at discharge. They have made arrangements for admission to [facility] but the patient may not be manageable at this level of care at the moment.   Social Worker assessment / plan:  CSW met with the patient and patient's son [name] at bedside to complete assessment. The patient and son share that they have made arrangements with [facility] and she was to admit on 2/12 but has been hospitalized. The patient and son state that they would prefer the patient to go to ALF at discharge but verbalize understanding that this may not be a possibility for the patient given her mobility issues at this time. CSW make SNF referrals but also communicate with [facility] to determine the appropriate venue of care. CSW will provide the family with SNF options, preference is for [facility].   Employment status:  Retired Programmer, applications PT Recommendations:  Not assessed at this time [place] / Referral to community resources:  [place], Other (Comment Required) (Referral made to SNFs and [facility])  Patient/Family's Response to care:  The patient and the sons are happy with the care the patient is receiving at this time.  Patient/Family's Understanding of and Emotional Response to Diagnosis, Current Treatment, and Prognosis:  The patient and family appear to have a good understanding of the reason for the patient's admission and post DC needs.   Emotional Assessment Appearance:  Appears stated age Attitude/Demeanor/Rapport:  Other (Patient is appropriate and welcoming of CSW.) Affect (typically observed):  Accepting, Appropriate, Calm, Pleasant Orientation:  Oriented to Self, Oriented to Place, Oriented to  Time, Oriented to Situation Alcohol / Substance use:  Not Applicable Psych involvement (Current and /or in the community):  No (Comment)  Discharge Needs  Concerns to be addressed:  Discharge Planning Concerns, Care Coordination Readmission within the last 30 days:  No Current discharge risk:  Chronically ill, Physical Impairment Barriers to Discharge:  Continued Medical Work up   Fredderick Phenix B, LCSW 12/22/2016, 9:20 AM

## 2016-12-23 NOTE — Progress Notes (Addendum)
Patient ID: Jill Johnson, female   DOB: Jun 06, 1941, 76 y.o.   MRN: [id number]  PROGRESS NOTE    MARRA FRAGA  WUJ:[id number] DOB: [birth year] DOA: 12/22/2016  PCP: Hollice Espy, MD   Brief Narrative:  76 year old female with past medical history significant for end-stage renal disease on hemodialysis (TTS) started about 2 months ago, CAD status post CABG and AVR (AV replacement), hypothyroidism, hypertension, recent critical limb ischemia. Initially under cardiology primary care and [facility] hospitalists consulted for evaluation of delirium. Please note patient under [facility] primary care starting 12/22/2016.  Patient has had left leg/foot pain for past month or so prior to this admission. She saw Dr. early in clinic and the plan was for outpatient angiography. In short stay, prior to the procedure, patient started to have chest pain and EKG showed new ST depressions. Patient was taken to cath lab on 12/23/2016. She is status post cardiac catheterization with SVG to PDA DES stent placement.   Assessment & Plan:   Non-ST elevation myocardial infarction  - SVG to PDA stent placement, DES.  - Dual antiplatelet therapy for at least 6 months per cardiology   - Continue with Brilinta for now, aspirin. Per cario, possibly change to plavix in future  - Aggressive secondary prevention with as noted asa and brilinta as well as atorvastatin, carvedilol  End-stage renal disease with hemodialysis - Left upper arm fistula - Appreciate renal following   Peripheral vascular disease with critical limb ischemia - Per Dr. Arbie Cookey pt is very poor surgical candidate - Plan for arteriogram and possible intervention today   Hyperlipidemia - Continue statin therapy   Bioprosthetic aortic valve replacement 2011  - Recent echocardiogram in December 2017 showed normal function  Coronary artery disease status post CABG 2011  - SVG to PDA stenting  Anemia of chronic kidney disease - Hgb 8.6, 8.4 - Follow  up CBC in am   Hyponatremia - Sodium 127 --> 129  Delirium - No significant changes in mental status this am   Hypothyroidism  - Continue synthroid   Bilateral LEs and left foot toes pressure injury  - Left medial LE:  3.5 x 2 with depth obscured by the presence of eschar, no exudate - Left lateral LE:  3cm x 1cm x 0.1cm partial thickness with pink, moist base and scant serous exudate - Right LE (anterior):  2.5cm x 1.5cm x 0.2cm full thickness wound with light yellow, moist wound bed and small amount of serous exudate - Dressing procedure/placement/frequency: Conservative wound care using betadine swab sticks to the left toes with eschar in an effort to keep them dry and infection free.  Prevalon Boots are provided bilaterally to protect from injury.  The LE wounds care twice daily with saline moistened gauze and topped with protective silicone foam.     DVT prophylaxis: Asa, brilinta  Code Status: full code  Family Communication: no family at the bedside this am; called pt son over the phone to give update this am Disposition Plan: plan for arteriogram today   Consultants:   Cardiology   Nephrology   Vascular surgery   Palliative care   WOC  Procedures:   ESDR on HD TTS  Antimicrobials:   None    Subjective: No overnight events.   Objective: Vitals:   12/22/16 1703 12/22/16 2002 01-19-2017 0000 Jan 19, 2017 1054  BP: (!) 106/49 121/67  (!) 160/42  Pulse:      Resp:      Temp:  98.6 F (37 C)  TempSrc:  Oral    SpO2:  97% 98%   Weight:      Height:        Intake/Output Summary (Last 24 hours) at 01/20/2017 1204 Last data filed at 12/22/16 1800  Gross per 24 hour  Intake              120 ml  Output                0 ml  Net              120 ml   Filed Weights   12/22/16 0500 12/22/16 0730 12/22/16 1105  Weight: 71.7 kg (158 lb) 72.7 kg (160 lb 4.4 oz) 70.1 kg (154 lb 8.7 oz)    Examination:  General exam: Appears calm and comfortable, no distress   Respiratory system: NO wheezing, no rhonchi  Cardiovascular system: S1 & S2 heard, Rate controlled  Gastrointestinal system: (+) BS, non tender abdomen  Central nervous system: No focal neurological deficits. Extremities: No tenderness Skin: ecchymoses on right inner thigh, has skin injury to both anterior shins with pink foam dressing on top Psychiatry: Disoriented, no agitation   Data Reviewed: I have personally reviewed following labs and imaging studies  CBC:  Recent Labs Lab 12/24/2016 1547 12/19/16 0822 12/20/16 0205 12/22/16 0739 Jan 20, 2017 0530  WBC 10.1 13.3* 14.5* 12.0* 10.4  HGB 7.9* 7.2* 8.4* 8.6* 8.4*  HCT 24.7* 21.9* 25.5* 25.2* 25.3*  MCV 100.4* 100.0 94.8 95.1 95.8  PLT 283 271 262 283 302   Basic Metabolic Panel:  Recent Labs Lab 12/10/2016 1230 12/19/16 0229 12/19/16 0822 12/22/16 0739 2017-01-20 0530  NA 135 137 135 127* 129*  K 3.4* 3.7 3.9 4.1 3.7  CL 98* 98* 101 92* 93*  CO2  --  26 22 23 25   GLUCOSE 98 73 72 104* 83  BUN 28* 30* 33* 46* 26*  CREATININE 3.50* 3.76* 4.11* 5.29* 3.40*  CALCIUM  --  8.1* 7.9* 8.0* 7.7*  PHOS  --   --   --  4.7*  --    GFR: Estimated Creatinine Clearance: 12.1 mL/min (by C-G formula based on SCr of 3.4 mg/dL (H)). Liver Function Tests:  Recent Labs Lab 12/19/16 0229 12/22/16 0739  AST 37  --   ALT 17  --   ALKPHOS 59  --   BILITOT 0.6  --   PROT 5.4*  --   ALBUMIN 2.4* 2.3*   No results for input(s): LIPASE, AMYLASE in the last 168 hours. No results for input(s): AMMONIA in the last 168 hours. Coagulation Profile:  Recent Labs Lab 01/04/2017 1547 01-20-17 0530  INR 1.18 1.17   Cardiac Enzymes:  Recent Labs Lab 01/04/2017 2108 12/19/16 0229 12/19/16 0822  TROPONINI 0.96* 2.11* 2.28*   BNP (last 3 results) No results for input(s): PROBNP in the last 8760 hours. HbA1C: No results for input(s): HGBA1C in the last 72 hours. CBG:  Recent Labs Lab 12/20/16 1943  GLUCAP 138*   Lipid Profile: No  results for input(s): CHOL, HDL, LDLCALC, TRIG, CHOLHDL, LDLDIRECT in the last 72 hours. Thyroid Function Tests: No results for input(s): TSH, T4TOTAL, FREET4, T3FREE, THYROIDAB in the last 72 hours. Anemia Panel: No results for input(s): VITAMINB12, FOLATE, FERRITIN, TIBC, IRON, RETICCTPCT in the last 72 hours. Urine analysis:    Component Value Date/Time   COLORURINE YELLOW 10/28/2016 1600   APPEARANCEUR CLEAR 10/28/2016 1600   LABSPEC 1.011 10/28/2016 1600   PHURINE 6.5 10/28/2016 1600  GLUCOSEU NEGATIVE 10/28/2016 1600   HGBUR NEGATIVE 10/28/2016 1600   BILIRUBINUR NEGATIVE 10/28/2016 1600   KETONESUR NEGATIVE 10/28/2016 1600   PROTEINUR 30 (A) 10/28/2016 1600   UROBILINOGEN 1.0 01/30/2010 1104   NITRITE NEGATIVE 10/28/2016 1600   LEUKOCYTESUR NEGATIVE 10/28/2016 1600   Sepsis Labs: @LABRCNTIP (procalcitonin:4,lacticidven:4)    Recent Results (from the past 240 hour(s))  MRSA PCR Screening     Status: None   Collection Time: 12/12/2016  8:18 PM  Result Value Ref Range Status   MRSA by PCR NEGATIVE NEGATIVE Final      Radiology Studies: Dg Chest Port 1 View Result Date: 12/19/2016 Cardiomegaly with central vascular congestion. Possible small left effusion. No overt pulmonary edema.    Scheduled Meds: . acetaminophen  1,000 mg Oral TID  . aspirin  81 mg Oral Daily  . atorvastatin  80 mg Oral q1800  . carvedilol  3.125 mg Oral BID WC  .  ceFAZolin IV  1 g Intravenous To OR  . cholecalciferol  2,000 Units Oral Daily  . gabapentin  200 mg Oral BID  . mouth rinse  15 mL Mouth Rinse BID  . sucroferric oxyhydroxide  500 mg Oral TID WC  . ticagrelor  90 mg Oral BID  . vitamin B-12  1,500 mcg Oral Daily   Continuous Infusions:   LOS: 5 days    Time spent: 25 minutes  Greater than 50% of the time spent on counseling and coordinating the care.   Manson PasseyEVINE, ALMA, MD [facility] Pager [phone]  If 7PM-7AM, please contact  night-coverage [email or web address] Password [email or web address] 2017/05/06, 12:04 PM

## 2016-12-23 NOTE — Transfer of Care (Signed)
Immediate Anesthesia Transfer of Care Note  Patient: Jill Johnson  Procedure(s) Performed: Procedure(s): AORTOGRAM WITH BILATERAL LOWER EXTREMITY RUNOFF; LEFT POPLITEAL STENTING (N/A)  Patient Location: PACU  Anesthesia Type:General  Level of Consciousness: awake, alert  and oriented  Airway & Oxygen Therapy: Patient Spontanous Breathing and Patient connected to nasal cannula oxygen  Post-op Assessment: Report given to RN and Post -op Vital signs reviewed and stable  Post vital signs: Reviewed and stable  Last Vitals:  Vitals:   12/22/16 2002 01/06/2017 1054  BP: 121/67 (!) 160/42  Pulse:    Resp:    Temp: 37 C     Last Pain:  Vitals:   12/11/2016 0537  TempSrc:   PainSc: Asleep      Patients Stated Pain Goal: 0 (12/10/2016 1118)  Complications: No apparent anesthesia complications

## 2016-12-23 NOTE — Progress Notes (Signed)
Pt and her belongings moved to room 3w29. Pt moved to room 3W29 for camera surveillance. Family aware. Report given to on coming nurse. Groin level 1.

## 2016-12-23 NOTE — Progress Notes (Signed)
PT Cancellation Note  Patient Details Name: Dorthula Perfectancy L Keltz MRN: [id number] DOB: 1941-04-16   Cancelled Treatment:    Reason Eval/Treat Not Completed: Patient at procedure or test/unavailable (pt at procedure, will follow. )   Tamala SerUhlenberg, L[place]wyn Heap Kistler June 26, 2017, 12:04 PM [phone]

## 2016-12-23 NOTE — Progress Notes (Signed)
Dr Sampson GoonFitzgerald called and informed of pt c/o pain new orders noted

## 2016-12-23 NOTE — Op Note (Signed)
    OPERATIVE REPORT  DATE OF SURGERY: 12/14/2016  PATIENT: Jill Johnson, 76 y.o. female MRN: [id number]  DOB: [birth year]  PRE-OPERATIVE DIAGNOSIS: Vertical limb ischemia with rest pain and tissue loss left foot  POST-OPERATIVE DIAGNOSIS:  Same  PROCEDURE: Aortogram and left leg runoff, crossing the popliteal artery occlusion and angioplasty andViaban stenting of left popliteal artery  SURGEON:  Curt Jews, M.D.  ANESTHESIA:  Gen.  PLAN OF CARE: PACU   PATIENT DISPOSITION:  PACU - hemodynamically stable  PROCEDURE DETAILS: Patient was taken place in the event position. Both groins prepped in the sterile fashion. Using SonoSite ultrasound singlewall puncture the right common femoral artery was entered and a guidewire passed to the level of the suprarenal aorta. A 5 French sheath was passed over the guidewire and a pigtail catheter was positioned the level renal arteries. AP projections undertaken and was widely patent aortoiliac segments. Catheter was positioned to the level of the aortic bifurcation and an ankle Glidewire was positioned down to the level of the left femoral artery. A straight catheter was then passed over the guidewire and a crossover sheath 7 [place] was used to go from the right groin down to the left common femoral artery. A angled Glidewire was then used to go through the level of the superficial femoral artery after arteriogram revealed that this was patent. The there was complete occlusion of the popliteal artery and dyspnea there is reconstitution of the peroneal artery below the knee. The peroneal artery itself was good caliber. A long quick cross catheter was positioned over the angled Glidewire was positioned down to the level of the occlusion. With manipulation of the angled Glidewire the area of total occlusion was eventually crossed and the quick cross catheter was positioned in the peroneal artery below the occlusion. This was confirmed by hand injection through  the quick cross catheter. A 5 mm by XD millimeter balloon was then passed over the guidewire at the level of the occlusion and this was dilated. The patient had been given 5000 units intravenous heparin. A repeat injection through the crossover sheath revealed patency of the popliteal artery. There was some irregularity at the popliteal artery and also there was concern with possible thrombus more distally in the peroneal artery. A long 4 mm x 1 20 mm balloon was then chosen to cross this area as well and this was gently dilated for 3 minutes to nominal pressure of 8 atm. Repeat hand injection showed that there was complete resolution of this area of irregularity and possible clot. Arteriogram further down into the foot showed that there was no evidence of thrombus distally. The peroneal artery did collateralize into the posterior tibial artery at the ankle. There was a persistent irregularity and probable debris at the level of popliteal artery. Decision was made to place a Viaban restent over this area. A 6 mm by; 5 cm stent was chosen and was positioned daily and adjust at the level of the mid popliteal artery proximally. Completion injection showed excellent result. The crossover catheter had its dilator replacing was pulled back into the right iliac artery. The patient was given 50 mg of protamine to reverse heparin. The eighth was pulled in the operating room and pressure was held for hemostasis   Curt Jews, M.D. 12/27/2016 4:31 PM

## 2016-12-23 NOTE — [place] FL2 (Signed)
[place] MEDICAID FL2 LEVEL OF CARE SCREENING TOOL     IDENTIFICATION  Patient Name: Jill Johnson Birthdate: 09-27-1941 Sex: female Admission Date (Current Location): 2017/01/03  [facility] and [place] Number:  Producer, television/film/video and Address:  [facility], [address] [place], [place] [address]      Provider Number: [id number]  Attending Physician Name and Address:  Alison Murray, MD  Relative Name and Phone Number:       Current Level of Care: Hospital Recommended Level of Care: Skilled Nursing Facility Prior Approval Number:    Date Approved/Denied:   PASRR Number:  [id number])  Discharge Plan: SNF    Current Diagnoses: Patient Active Problem List   Diagnosis Date Noted  . Unstable angina (HCC) 2017/01/03  . Anemia, chronic disease Jan 03, 2017  . ESRD on hemodialysis (HCC) 2017-01-03  . PAD (peripheral artery disease) (HCC) Jan 03, 2017  . Critical lower limb ischemia January 03, 2017  . Coronary artery disease involving native coronary artery of native heart with unstable angina pectoris (HCC) 03-Jan-2017  . Chest pain 10/28/2016  . Fluid overload 10/28/2016  . Elevated troponin 10/28/2016  . Symptomatic anemia 10/28/2016  . ESRD (end stage renal disease) (HCC) 10/28/2016  . Hypothyroidism 10/28/2016  . Respiratory distress 10/28/2016  . Acute diastolic heart failure (HCC) 12/20/2015  . Obesity 12/20/2015  . Coronary artery disease due to lipid rich plaque 12/20/2015  . S/P AVR (aortic valve replacement) 12/20/2015  . Chronic kidney disease, stage 3 12/20/2015    Orientation RESPIRATION BLADDER Height & Weight     Self, Time, Situation, Place  Normal Continent Weight: 70.1 kg (154 lb 8.7 oz) Height:  5' (152.4 cm)  BEHAVIORAL SYMPTOMS/MOOD NEUROLOGICAL BOWEL NUTRITION STATUS   (none)  (none) Continent Diet (Heart Healthy )  AMBULATORY STATUS COMMUNICATION OF NEEDS Skin   Extensive Assist Verbally Other (Comment) (non-pressure wound:  R Leg, Diabetic Foot Ulcer: L Foot)                       Personal Care Assistance Level of Assistance  Bathing, Dressing, Feeding Bathing Assistance: Limited assistance Feeding assistance: Independent Dressing Assistance: Limited assistance     Functional Limitations Info  Speech, Hearing, Sight Sight Info: Adequate Hearing Info: Adequate Speech Info: Adequate    SPECIAL CARE FACTORS FREQUENCY  PT (By licensed PT), OT (By licensed OT) (HD - MWF - [facility])     PT Frequency: 5 OT Frequency: 5            Contractures      Additional Factors Info  Code Status, Allergies Code Status Info: FULL CODE  Allergies Info: Prednisone           Current Medications (12/16/2016):  This is the current hospital active medication list Current Facility-Administered Medications  Medication Dose Route Frequency Provider Last Rate Last Dose  . acetaminophen (TYLENOL) tablet 1,000 mg  1,000 mg Oral TID Alberteen Sam, MD   1,000 mg at 12/18/2016 1059  . ALPRAZolam Prudy Feeler) tablet 0.25 mg  0.25 mg Oral TID PRN Barbara Cower, NP      . aspirin chewable tablet 81 mg  81 mg Oral Daily Tonny Bollman, MD   81 mg at 12/24/2016 1102  . atorvastatin (LIPITOR) tablet 80 mg  80 mg Oral q1800 Rhonda G Barrett, PA-C   80 mg at 12/22/16 1658  . carvedilol (COREG) tablet 3.125 mg  3.125 mg Oral BID WC Joline Salt  Barrett, PA-C   3.125 mg at 2017-10-18 1103  . ceFAZolin (ANCEF) IVPB 1 g/50 mL premix  1 g Intravenous To OR Samantha J Rhyne, PA-C      . cholecalciferol (VITAMIN D) tablet 2,000 Units  2,000 Units Oral Daily Tonny BollmanMichael Cooper, MD   2,000 Units at 2017-10-18 1104  . [START ON 12/24/2016] Darbepoetin Alfa (ARANESP) injection 100 mcg  100 mcg Intravenous Q Thu-HD Bufford ButtnerElizabeth Upton, MD      . dextromethorphan-guaiFENesin [facility](MUCINEX DM) 30-600 MG per 12 hr tablet 1 tablet  1 tablet Oral Daily PRN Lennette Biharihomas A Kelly, MD      . feeding supplement (PRO-STAT SUGAR FREE 64) liquid 30 mL  30 mL Oral  BID Bufford ButtnerElizabeth Upton, MD      . gabapentin (NEURONTIN) capsule 200 mg  200 mg Oral BID Alberteen Samhristopher P Danford, MD   200 mg at 2017-10-18 1102  . levothyroxine (SYNTHROID, LEVOTHROID) tablet 112 mcg  112 mcg Oral QAC breakfast Tonny BollmanMichael Cooper, MD   112 mcg at 2017-10-18 1058  . MEDLINE mouth rinse  15 mL Mouth Rinse BID Jake BatheMark C Skains, MD   15 mL at 2017-10-18 1000  . mirtazapine (REMERON) tablet 7.5 mg  7.5 mg Oral QHS Barbara CowerKasie J Mahan, NP      . nitroGLYCERIN (NITROSTAT) SL tablet 0.4 mg  0.4 mg Sublingual Q5 Min x 3 PRN Rhonda G Barrett, PA-C      . ondansetron (ZOFRAN) injection 4 mg  4 mg Intravenous Q6H PRN Joline Salthonda G Barrett, PA-C   4 mg at 2017-10-18 0150  . oxyCODONE (Oxy IR/ROXICODONE) immediate release tablet 5 mg  5 mg Oral Q4H PRN Alberteen Samhristopher P Danford, MD   5 mg at 2017-10-18 0504  . oxyCODONE (OXYCONTIN) 12 hr tablet 10 mg  10 mg Oral Q12H Barbara CowerKasie J Mahan, NP      . senna-docusate (Senokot-S) tablet 1 tablet  1 tablet Oral QHS PRN Tonny BollmanMichael Cooper, MD   1 tablet at 12/19/16 2103  . sucroferric oxyhydroxide (VELPHORO) chewable tablet 500 mg  500 mg Oral TID WC Bufford ButtnerElizabeth Upton, MD   500 mg at 12/22/16 1659  . ticagrelor (BRILINTA) tablet 90 mg  90 mg Oral BID Tonny BollmanMichael Cooper, MD   90 mg at 2017-10-18 1058  . vitamin B-12 (CYANOCOBALAMIN) tablet 1,500 mcg  1,500 mcg Oral Daily Lennette Biharihomas A Kelly, MD   1,500 mcg at 2017-10-18 1101     Discharge Medications: Please see discharge summary for a list of discharge medications.  Relevant Imaging Results:  Relevant Lab Results:   Additional Information [id number]  Venita LickCampbell, Hien Perreira B, LCSW

## 2016-12-23 NOTE — Anesthesia Preprocedure Evaluation (Addendum)
Anesthesia Evaluation  Patient identified by MRN, date of birth, ID band Patient awake    Reviewed: Allergy & Precautions, H&P , NPO status , Patient's Chart, lab work & pertinent test results, reviewed documented beta blocker date and time   History of Anesthesia Complications (+) DIFFICULT AIRWAY  Airway Mallampati: III  TM Distance: >3 FB Neck ROM: Full    Dental no notable dental hx. (+) Teeth Intact, Dental Advisory Given   Pulmonary neg pulmonary ROS,    Pulmonary exam normal breath sounds clear to auscultation       Cardiovascular hypertension, Pt. on medications and Pt. on home beta blockers + CAD, + Cardiac Stents and + Peripheral Vascular Disease  + Valvular Problems/Murmurs MR  Rhythm:Regular Rate:Normal  S/p AVR   Neuro/Psych Depression CVA    GI/Hepatic negative GI ROS, Neg liver ROS,   Endo/Other  Hypothyroidism   Renal/GU ESRF and DialysisRenal disease  negative genitourinary   Musculoskeletal  (+) Arthritis , Osteoarthritis,    Abdominal   Peds  Hematology negative hematology ROS (+) anemia ,   Anesthesia Other Findings   Reproductive/Obstetrics negative OB ROS                           Anesthesia Physical Anesthesia Plan  ASA: III  Anesthesia Plan: General   Post-op Pain Management:    Induction: Intravenous  Airway Management Planned: LMA and Oral ETT  Additional Equipment:   Intra-op Plan:   Post-operative Plan: Extubation in OR  Informed Consent: I have reviewed the patients History and Physical, chart, labs and discussed the procedure including the risks, benefits and alternatives for the proposed anesthesia with the patient or authorized representative who has indicated his/her understanding and acceptance.   Dental advisory given  Plan Discussed with: CRNA, Anesthesiologist and Surgeon  Anesthesia Plan Comments:        Anesthesia Quick  Evaluation

## 2016-12-23 NOTE — Progress Notes (Signed)
Patient insist on having multiple sips of water without medication administration. Patient educated on the need to be npo before surgery

## 2016-12-23 NOTE — Progress Notes (Signed)
Progress Note  Patient Name: Jill Johnson Date of Encounter: 12/24/2016  Primary Cardiologist: Anne Fu  Subjective   Less agitated for angiogram today no chest pain   Inpatient Medications    Scheduled Meds: . acetaminophen  1,000 mg Oral TID  . aspirin  81 mg Oral Daily  . atorvastatin  80 mg Oral q1800  . carvedilol  3.125 mg Oral BID WC  .  ceFAZolin (ANCEF) IV  1 g Intravenous To OR  . cholecalciferol  2,000 Units Oral Daily  . gabapentin  200 mg Oral BID  . levothyroxine  112 mcg Oral QAC breakfast  . mouth rinse  15 mL Mouth Rinse BID  . sucroferric oxyhydroxide  500 mg Oral TID WC  . ticagrelor  90 mg Oral BID  . vitamin B-12  1,500 mcg Oral Daily   Continuous Infusions:  PRN Meds: dextromethorphan-guaiFENesin, nitroGLYCERIN, ondansetron (ZOFRAN) IV, oxyCODONE, senna-docusate   Vital Signs    Vitals:   12/22/16 1230 12/22/16 1703 12/22/16 2002 12/13/2016 0000  BP: 133/71 (!) 106/49 121/67   Pulse: 61     Resp: 16     Temp: 97.5 F (36.4 C)  98.6 F (37 C)   TempSrc: Oral  Oral   SpO2: 97%  97% 98%  Weight:      Height:        Intake/Output Summary (Last 24 hours) at 12/22/2016 0925 Last data filed at 12/22/16 1800  Gross per 24 hour  Intake              120 ml  Output             1857 ml  Net            -1737 ml   Filed Weights   12/22/16 0500 12/22/16 0730 12/22/16 1105  Weight: 158 lb (71.7 kg) 160 lb 4.4 oz (72.7 kg) 154 lb 8.7 oz (70.1 kg)    Telemetry    NSR, no VT - Personally Reviewed  ECG    ST depression lateral improved - Personally Reviewed  Physical Exam   Affect appropriate Chronically ill white female  HEENT: normal Neck supple with no adenopathy JVP normal no bruits no thyromegaly Lungs clear with no wheezing and good diaphragmatic motion Heart:  S1/S2 no murmur, no rub, gallop or click PMI normal LUE fistual  Abdomen: benighn, BS positve, no tenderness, no AAA no bruit.  No HSM or HJR Unable to palpate distal  pulses with dressings on both shins  No edema Neuro non-focal Skin warm and dry    Labs    Chemistry  Recent Labs Lab 12/19/16 0229 12/19/16 0822 12/22/16 0739 12/22/2016 0530  NA 137 135 127* 129*  K 3.7 3.9 4.1 3.7  CL 98* 101 92* 93*  CO2 26 22 23 25   GLUCOSE 73 72 104* 83  BUN 30* 33* 46* 26*  CREATININE 3.76* 4.11* 5.29* 3.40*  CALCIUM 8.1* 7.9* 8.0* 7.7*  PROT 5.4*  --   --   --   ALBUMIN 2.4*  --  2.3*  --   AST 37  --   --   --   ALT 17  --   --   --   ALKPHOS 59  --   --   --   BILITOT 0.6  --   --   --   GFRNONAA 11* 10* 7* 12*  GFRAA 12* 11* 8* 14*  ANIONGAP 13 12 12  11  Hematology  Recent Labs Lab 12/20/16 0205 12/22/16 0739 12/27/2016 0530  WBC 14.5* 12.0* 10.4  RBC 2.69* 2.65* 2.64*  HGB 8.4* 8.6* 8.4*  HCT 25.5* 25.2* 25.3*  MCV 94.8 95.1 95.8  MCH 31.2 32.5 31.8  MCHC 32.9 34.1 33.2  RDW 17.4* 17.2* 17.6*  PLT 262 283 302    Cardiac Enzymes  Recent Labs Lab 2017/01/15 2108 12/19/16 0229 12/19/16 0822  TROPONINI 0.96* 2.11* 2.28*   No results for input(s): TROPIPOC in the last 168 hours.   BNPNo results for input(s): BNP, PROBNP in the last 168 hours.   DDimer No results for input(s): DDIMER in the last 168 hours.   Radiology    No results found.  Cardiac Studies   Cardiac catheterization 01-15-17 Conclusion   1. Severe 2 vessel CAD with total occlusion of the LAD and total occlusion of the RCA 2. S/P CABG with patency of the SVG-diag, critical stenosis of the SVG-PDA, and non-visualization of the LIMA-LAD 3. Successful PCI of the SVG-PDA with a DES platform  Recommend:  Load with Brilinta 180 mg when patient able to swallow medicine  Continue aggrastat at reduced dose until patient takes brilinta  DAPT with ASA/brilinta as tolerated at least 6 months. If she needs to switch to a less-potent thienopyridine for vascular surgery she could be switched to plavix.    Echocardiogram 10/31/16 - Left ventricle: The cavity  size was normal. Wall thickness was   increased in a pattern of moderate LVH. Systolic function was   normal. The estimated ejection fraction was in the range of 60%   to 65%. Wall motion was normal; there were no regional wall   motion abnormalities. Features are consistent with a pseudonormal   left ventricular filling pattern, with concomitant abnormal   relaxation and increased filling pressure (grade 2 diastolic   dysfunction). - Aortic valve: Bioprosthesis in aortic position. There was trivial   regurgitation. Mean gradient (S): 29 mm Hg. Peak gradient (S): 51   mm Hg. VTI ratio of LVOT to aortic valve: 0.4. - Mitral valve: Calcified annulus. Mildly thickened leaflets .   There was mild to moderate regurgitation. - Left atrium: The atrium was severely dilated. - Tricuspid valve: There was trivial regurgitation. - Pulmonary arteries: Systolic pressure could not be accurately   estimated. - Pericardium, extracardiac: There was no pericardial effusion.  Impressions:  - Moderate LVH with LVEF 60-65% and grade 2 diastolic dysfunction.   Severe left atrial enlargement. Moderately calcified mitral   annulus with thickened leaflets and mild to moderate mitral   regurgitation. 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   Trivial aortic regurgitation noted. Gradients have increased   compared to the prior study with mean gradient 29 mmHg, although   the dimensionless index does not suggest prosthetic valve   stenosis. Trivial tricuspid regurgitation, unable to assess PASP.  Patient Profile     76 y.o. female here with non-ST elevation myocardial infarction status post cardiac catheterization with SVG to PDA DES stent placement, post CABG 2011 with bioprosthetic aortic valve replacement, end-stage renal disease on hemodialysis, severe peripheral vascular disease with critical limb ischemia of left foot with ulcerated wounds, status post left upper arm AV fistula followed by Dr. Arbie Cookey,    Assessment & Plan    Non-ST elevation myocardial infarction  - SVG to PDA stent placement, DES.  - Dual antiplatelet therapy for at least 6 months  - Continue with Brilinta for now, can switch to Plavix if necessary in  the future. Please see catheterization note.  - Aggressive secondary prevention, aspirin, Brilinta, atorvastatin, carvedilol  End-stage renal disease with hemodialysis  - Left upper arm fistula  - Fluid status per HD.   - Appreciate Nepho team.   Peripheral vascular disease with critical limb ischemia  - for angiogram in OR today   Hyperlipidemia  - Atorvastatin 80  Bioprosthetic aortic valve replacement 2011  - Antibiotic prophylaxis if necessary  - Recent echocardiogram in December 2017 showed normal function  Coronary artery disease status post CABG 2011  - See cardiac catheterization report as above.  - SVG to PDA stenting. DAT   Delirium  - TRH consult. Confusion. Anxiety  Hypotension  - We will cancel losartan 50 mg and amlodipine for now  - Hold carvedilol  Anemia  - Hemoglobin 7.9 on 12/25/2016.  - Blood products were given during hemodialysis, Dr. Arlean HoppingSchertz. Approx 1 unit given   At [facility] point [facility]   Signed, Charlton HawsPeter Kalen Neidert, MD  January 19, 2017, 9:25 AM

## 2016-12-23 NOTE — Consult Note (Signed)
Consultation Note Date: 12/22/2016   Patient Name: Jill Johnson  DOB: October 06, 1941  MRN: [id number]  Age / Sex: 76 y.o., female  PCP: Darcus Austin, MD Referring Physician: Robbie Lis, MD  Reason for Consultation: Establishing goals of care  HPI/Patient Profile: 76 y.o. female  with past medical history of ESRD (on HD), HLD, HTN, CAD s/p CABG (2011), TIA, CVA, critical limb ischemia admitted on 12/17/2016 with chest pain. She had presented for scheduled lower limb angiography, however, before the procedure she was found to have chest pain and EKG revealed ST depression. She is now s/p cardiac cath with stent placement. Palliative medicine consulted for [place].   Clinical Assessment and Goals of Care: Met with patient at bedside. She is a retired Marine scientist. She was alert and oriented and judged to be competent for decision making. She has two sons who she has designated as her HCPOA in the event that she cannot make decisions. She has a clear understanding of her chronic illnesses. She is not happy about the possibility of going to SNF after discharge, but she understands the necessity. Her main goals are to be able to walk, and to have decreased pain. She requests DNR code status and fully understands this means no CPR, no intubation, no attempts at resuscitation if she were to die. She is happy going to dialysis and feels this improves her quality of life. She notes that she get anxious and agitated when she is there due to the intense pain in her legs. She requests medication for anxiety and pain prior to dialysis and she feels this would enable her to tolerate it better. She notes anxiety in general and regrets that she sometimes is "bitchy" towards her healthcare team. She has a great sense of loss of control, and being independent is important to her.  She is tearful, notes history of depression. The pain contributes to her  depressed mood. Pain is relieved very little with pain medications, keeps her awake and unable to sleep. She is not suicidal. She derives joy in seeing her grandchildren. She requests medication for her anxiety and depression that will not make her feel "doped up".  She wanted to review her medication list which I did with her at length. She is familiar with most medications. Requests that RN tell her what each pill is when she gets them.   Primary Decision Maker PATIENT    SUMMARY OF RECOMMENDATIONS -DNR- I notified family as well of our conversation -Continue current level of care -Pain-   -oxycontin '10mg'$  bid (dose based on patient's usage of oxy IR);   -continue oxy ir for breakthrough pain (give breakthrough dose before dialysis) -Anxiety/Depression/Insomnia- xanax .'25mg'$  TID prn (also give 30 min before going to dialysis)  -Senna 1 tab po nightly for bowel prophylaxis d/t opioid               - mirtazapine 7.'5mg'$  po QHS (titrate up as needed)  Code Status/Advance Care Planning:  DNR  Palliative Prophylaxis:   Frequent  Pain Assessment  Prognosis:    Unable to determine  Discharge Planning: To Be Determined  Primary Diagnoses: Present on Admission: . Unstable angina ([place]) . Anemia, chronic disease . Critical lower limb ischemia . Coronary artery disease involving native coronary artery of native heart with unstable angina pectoris ([place])   I have reviewed the medical record, interviewed the patient and family, and examined the patient. The following aspects are pertinent.  Past Medical History:  Diagnosis Date  . Anemia   . CAD (coronary artery disease)   . CFS (chronic fatigue syndrome)   . Chronic rhinitis   . Complication of anesthesia    "a little hard waking up"  . Constipation    due to iron  . Coronary atherosclerosis   . Critical lower limb ischemia 12/20/2016  . Depression   . Difficult intubation    "I have a small opening" When she had her CABG   . DJD  (degenerative joint disease)   . ESRD on hemodialysis ([place]) 01/02/2017  . Hyperlipidemia   . Hypertension   . Hypothyroidism   . Kidney stone   . MDD (major depressive disorder)   . Neuropathy ([facility])    in legs  . Obesity   . Osteopenia   . PAD (peripheral artery disease) ([place]) 12/28/2016  . Renal insufficiency    Post Op  . Stroke [facility])    TIA, several   Social History   Social History  . Marital status: Divorced    Spouse name: N/A  . Number of children: N/A  . Years of education: N/A   Occupational History  . RETIRED NURSE    Social History Main Topics  . Smoking status: Never Smoker  . Smokeless tobacco: Never Used  . Alcohol use No  . Drug use: No  . Sexual activity: Not Asked   Other Topics Concern  . None   Social History Narrative   Pt has health care POA, her sons.   Family History  Problem Relation Age of Onset  . Alzheimer's disease Mother   . Aortic stenosis Father   . Heart disease Father   . Heart disease Son    Scheduled Meds: . acetaminophen  1,000 mg Oral TID  . aspirin  81 mg Oral Daily  . atorvastatin  80 mg Oral q1800  . carvedilol  3.125 mg Oral BID WC  .  ceFAZolin (ANCEF) IV  1 g Intravenous To OR  . cholecalciferol  2,000 Units Oral Daily  . gabapentin  200 mg Oral BID  . levothyroxine  112 mcg Oral QAC breakfast  . mouth rinse  15 mL Mouth Rinse BID  . sucroferric oxyhydroxide  500 mg Oral TID WC  . ticagrelor  90 mg Oral BID  . vitamin B-12  1,500 mcg Oral Daily   Continuous Infusions: PRN Meds:.dextromethorphan-guaiFENesin, nitroGLYCERIN, ondansetron (ZOFRAN) IV, oxyCODONE, senna-docusate Medications Prior to Admission:  Prior to Admission medications   Medication Sig Start Date End Date Taking? Authorizing Provider  acetaminophen (TYLENOL) 325 MG tablet Take 2 tablets (650 mg total) by mouth every 6 (six) hours as needed for mild pain (or Fever >/= 101). 11/07/16  Yes Doreatha Lew, MD  amLODipine (NORVASC) 10 MG tablet  Take 1 tablet (10 mg total) by mouth daily. 05/10/14  Yes Jerline Pain, MD  aspirin 81 MG tablet Take 1 tablet (81 mg total) by mouth daily. 11/07/16  Yes Doreatha Lew, MD  calcium acetate (PHOSLO) 667 MG capsule Take 229  mg by mouth 3 (three) times daily with meals.    Yes Historical Provider, MD  cholecalciferol (VITAMIN D) 1000 units tablet Take 2,000 Units by mouth daily.    Yes Historical Provider, MD  Cyanocobalamin (VITAMIN B-12 CR) 1500 MCG TBCR Take 1,500 mcg by mouth daily.   Yes Historical Provider, MD  gabapentin (NEURONTIN) 300 MG capsule Take 300 mg by mouth 2 (two) times daily.  03/27/16  Yes Historical Provider, MD  levothyroxine (SYNTHROID, LEVOTHROID) 112 MCG tablet Take 112 mcg by mouth daily before breakfast.  12/11/15  Yes Historical Provider, MD  LORazepam (ATIVAN) 0.5 MG tablet Take 0.5 mg by mouth 2 (two) times daily as needed. Anxiety/pain. 11/23/16  Yes Historical Provider, MD  losartan (COZAAR) 50 MG tablet Take 1 tablet (50 mg total) by mouth at bedtime. 11/07/16  Yes Doreatha Lew, MD  senna-docusate (SENOKOT-S) 8.6-50 MG tablet Take 1 tablet by mouth at bedtime as needed for mild constipation. 11/07/16  Yes Doreatha Lew, MD  traMADol (ULTRAM) 50 MG tablet Take 50 mg by mouth 4 (four) times daily as needed for pain. 11/23/16  Yes Historical Provider, MD  Darbepoetin Alfa (ARANESP) 150 MCG/0.3ML SOSY injection Inject 0.3 mLs (150 mcg total) into the vein every Wednesday with hemodialysis. Patient not taking: Reported on 12/16/2016 11/11/16   Doreatha Lew, MD  Dextromethorphan-Guaifenesin (CORICIDIN HBP CONGESTION/COUGH) 10-200 MG CAPS Take 1 capsule by mouth daily as needed (cold).    Historical Provider, MD   Allergies  Allergen Reactions  . Prednisone Other (See Comments)    Makes her loony   Review of Systems  Constitutional: Positive for fatigue.  Psychiatric/Behavioral: Positive for dysphoric mood and sleep disturbance. Negative for suicidal ideas.  The patient is nervous/anxious.     Physical Exam  Constitutional: She is oriented to person, place, and time. She appears well-developed and well-nourished.  Cardiovascular: Normal rate and regular rhythm.   Pulmonary/Chest: Effort normal and breath sounds normal.  Abdominal: Soft. Bowel sounds are normal.  Musculoskeletal: She exhibits edema.  Neurological: She is alert and oriented to person, place, and time.  Skin: Skin is warm and dry.  Left lower extremity edema, weeping wounds, dressing in place  Psychiatric: She has a normal mood and affect. Her behavior is normal. Judgment and thought content normal.    Vital Signs: BP 121/67 (BP Location: Right Arm)   Pulse 61   Temp 98.6 F (37 C) (Oral)   Resp 16   Ht 5' (1.524 m)   Wt 70.1 kg (154 lb 8.7 oz)   SpO2 98%   BMI 30.18 kg/m  Pain Assessment: 0-10   Pain Score: Asleep   SpO2: SpO2: 98 % O2 Device:SpO2: 98 % O2 Flow Rate: .O2 Flow Rate (L/min): 2 L/min  IO: Intake/output summary:  Intake/Output Summary (Last 24 hours) at 01/04/2017 1021 Last data filed at 12/22/16 1800  Gross per 24 hour  Intake              120 ml  Output             1857 ml  Net            -1737 ml    LBM: Last BM Date: 12/21/16 Baseline Weight: Weight: 71.7 kg (158 lb) Most recent weight: Weight: 70.1 kg (154 lb 8.7 oz)     Palliative Assessment/Data: PPS: 60%   Flowsheet Rows   Flowsheet Row Most Recent Value  Intake Tab  Referral Department  Hospitalist  Unit at Time of Referral  Cardiac/Telemetry Unit  Palliative Care Primary Diagnosis  Cardiac  Date Notified  12/22/16  Palliative Care Type  New Palliative care  Reason for referral  Clarify Goals of Care  Date of Admission  12/17/2016  # of days IP prior to Palliative referral  4  Clinical Assessment  Psychosocial & Spiritual Assessment  Palliative Care Outcomes      Thank you for this consult. Palliative medicine will continue to follow and assist as needed.   Time Total:  90 minutes  Greater than 50%  of this time was spent counseling and coordinating care related to the above assessment and plan.  Signed by: Mariana Kaufman, AGNP-C Palliative Medicine    Please contact Palliative Medicine Team phone at [phone] for questions and concerns.  For individual provider: See Shea Evans

## 2016-12-23 NOTE — H&P (View-Only) (Signed)
Subjective: Interval History: none.. Currently in hemodialysis. Very agitated and confused. Asking when she is going to dialysis and repeatedly explained that she was in hemodialysis and receiving dialysis currently. Reports severe pain in her left hip and left foot. If occult to console.  Objective: Vital signs in last 24 hours: Temp:  [97.5 F (36.4 C)-98.2 F (36.8 C)] 97.5 F (36.4 C) (02/13 0730) Pulse Rate:  [50-67] 65 (02/13 0815) Resp:  [16] 16 (02/13 0730) BP: (95-153)/(39-57) 95/41 (02/13 0815) SpO2:  [96 %-98 %] 98 % (02/13 0730) Weight:  [158 lb (71.7 kg)-160 lb 4.4 oz (72.7 kg)] 160 lb 4.4 oz (72.7 kg) (02/13 0730)  Intake/Output from previous day: 02/12 0701 - 02/13 0700 In: 720 [P.O.:720] Out: 200 [Urine:200] Intake/Output this shift: No intake/output data recorded.  No change. In her physical exam. Right groin bruising stable without any increased ecchymosis.  Lab Results:  Recent Labs  12/20/16 0205 12/22/16 0739  WBC 14.5* 12.0*  HGB 8.4* 8.6*  HCT 25.5* 25.2*  PLT 262 283   BMET  Recent Labs  12/22/16 0739  NA 127*  K 4.1  CL 92*  CO2 23  GLUCOSE 104*  BUN 46*  CREATININE 5.29*  CALCIUM 8.0*    Studies/Results: Dg Chest Port 1 View  Result Date: 12/19/2016 CLINICAL DATA:  Daly Whipkey termination of dialysis diminished lung sounds EXAM: PORTABLE CHEST 1 VIEW COMPARISON:  10/28/2016 FINDINGS: Post sternotomy changes and valvular prosthesis. Cardiomegaly is present. Central vascular congestion without overt edema. Possible small left pleural effusion. Atherosclerosis. No pneumothorax. IMPRESSION: Cardiomegaly with central vascular congestion. Possible small left effusion. No overt pulmonary edema. Electronically Signed   By: Jasmine PangKim  Fujinaga M.D.   On: 12/19/2016 23:59   Anti-infectives: Anti-infectives    Start     Dose/Rate Route Frequency Ordered Stop   12/12/2016 0000  ceFAZolin (ANCEF) IVPB 1 g/50 mL premix    Comments:  Send with pt to OR   1  g 100 mL/hr over 30 Minutes Intravenous On call 12/22/16 0815 12/24/16 0000      Assessment/Plan: s/p Procedure(s): Left Heart Cath and Cors/Grafts Angiography (N/A) Coronary Stent Intervention (N/A) Extremely difficult management problem. Severe confusion. Agitation. Tentatively plan for arteriogram tomorrow in the operating room to allow sedation. Critical limb ischemia of left foot with limited options regarding operative repair in her current medical status.  Will defer to primary service but would strongly consider palliative care consult to discuss pain management and goals of care and expectations with the patient and her family.  LOS: 4 days   Gretta Beganarly, Raea Magallon 12/22/2016, 8:44 AM

## 2016-12-23 NOTE — Interval H&P Note (Signed)
History and Physical Interval Note:  12/14/2016 12:18 PM  Jill Johnson  has presented today for surgery, with the diagnosis of Critical left foot ischemia I99.9  The various methods of treatment have been discussed with the patient and family. After consideration of risks, benefits and other options for treatment, the patient has consented to  Procedure(s): AORTOGRAM WITH BILATERAL LOWER EXTREMITY RUNOFF; POSSIBLE INTERVENTION (N/A) as a surgical intervention .  The patient's history has been reviewed, patient examined, no change in status, stable for surgery.  I have reviewed the patient's chart and labs.  Questions were answered to the patient's satisfaction.     [name], Hasan Douse

## 2016-12-23 NOTE — Progress Notes (Signed)
[facility] Progress Note   Subjective: Looks better this AM- sitting up in the chair.  Was talking with Palliative Care before we came into the room.  Vitals:   12/22/16 1703 12/22/16 2002 2017/01/20 0000 01-20-17 1054  BP: (!) 106/49 121/67  (!) 160/42  Pulse:      Resp:      Temp:  98.6 F (37 C)    TempSrc:  Oral    SpO2:  97% 98%   Weight:      Height:        Inpatient medications: . acetaminophen  1,000 mg Oral TID  . aspirin  81 mg Oral Daily  . atorvastatin  80 mg Oral q1800  . carvedilol  3.125 mg Oral BID WC  .  ceFAZolin (ANCEF) IV  1 g Intravenous To OR  . cholecalciferol  2,000 Units Oral Daily  . gabapentin  200 mg Oral BID  . levothyroxine  112 mcg Oral QAC breakfast  . mouth rinse  15 mL Mouth Rinse BID  . mirtazapine  7.5 mg Oral QHS  . oxyCODONE  10 mg Oral Q12H  . sucroferric oxyhydroxide  500 mg Oral TID WC  . ticagrelor  90 mg Oral BID  . vitamin B-12  1,500 mcg Oral Daily    ALPRAZolam, dextromethorphan-guaiFENesin, nitroGLYCERIN, ondansetron (ZOFRAN) IV, oxyCODONE, senna-docusate  Exam: Gen alert, sitting in chair, NAD NECK No jvd or bruits PULM CTAB no c/w/r CV RRR III/VI harsh systolic murmur RUSB Abd soft ntnd no mass or ascites +bs      Ext L lower extremity with necrotic wounds on medial aspect.  L toes dressed, this dressing was not removed but the toes appear necrotic through the dressing. Neuro: AAO x 3 LUA AVF, infiltration improving.   Dialysis: TTS NW 4h  67kg   2/2.25 bath  Hep 2000  LUA AVF   16ga needles (does not want 15 ga)  - darbe 200/ wk last 2/8  - venofer 100 mg/ hd, one dose left  - last Hb 8, Ca 9, P 5.8, pth 251  - tums 1 ac, cozaar 50 hs  Assessment: 1.  Acute MI - sp PCI to SVG-PDA 2/9. Started on [place]; on DAPT 2.  ESRD on HD TTS - started HD 6 wks ago.  No further problems with infiltration.  Next HD scheduled tomorrow (thursday)  She is having a hard time coping with dialysis.  Her LLE pain  is expectedly worse during dialysis and if there are no options to mitigate critical limb ischemia it may be appropriate to discuss shifting focus of care to symptom management/ comfort rather than aggressive care.  Appreciate palliative care involvement. 3.  PAD / L leg ulcers - per VVS, sig disease LLE; arteriogram today 4.  HTN/ vol - does not appear overloaded.  EDWs being gotten in the bed. 5.  Hx CABG/ bio-AVR 2011 6.  Anemia of CKD - Hb in the mid 8s.  Aranesp 100 q Thursday. Completed an iron load as an outpatient.   7.  MBD - Phoslo 2 capsules AC before meals- in setting of bad atherosclerosis, changing to velphoro 1 AC TID; following Phos 8.  Nutrition: albumin 2.3, prostat    Bufford Buttner MD [facility] pgr [phone] 2017-01-20, 11:23 AM    Recent Labs Lab 12/19/16 0822 12/22/16 0739 01-20-2017 0530  NA 135 127* 129*  K 3.9 4.1 3.7  CL 101 92* 93*  CO2 22 23 25   GLUCOSE  72 104* 83  BUN 33* 46* 26*  CREATININE 4.11* 5.29* 3.40*  CALCIUM 7.9* 8.0* 7.7*  PHOS  --  4.7*  --     Recent Labs Lab 12/19/16 0229 12/22/16 0739  AST 37  --   ALT 17  --   ALKPHOS 59  --   BILITOT 0.6  --   PROT 5.4*  --   ALBUMIN 2.4* 2.3*    Recent Labs Lab 12/20/16 0205 12/22/16 0739 30-Apr-2017 0530  WBC 14.5* 12.0* 10.4  HGB 8.4* 8.6* 8.4*  HCT 25.5* 25.2* 25.3*  MCV 94.8 95.1 95.8  PLT 262 283 302   Iron/TIBC/Ferritin/ %Sat    Component Value Date/Time   IRON 49 10/20/2016 0929   TIBC 354 10/20/2016 0929   FERRITIN 387 (H) 10/20/2016 0929   IRONPCTSAT 14 10/20/2016 0929

## 2016-12-23 NOTE — Anesthesia Procedure Notes (Signed)
Procedure Name: LMA Insertion Date/Time: 12/26/2016 12:57 PM Performed by: Rogelia BogaMUELLER, Athena Baltz P Pre-anesthesia Checklist: Patient identified, Emergency Drugs available, Suction available, Patient being monitored and Timeout performed Patient Re-evaluated:Patient Re-evaluated prior to inductionOxygen Delivery Method: Circle system utilized Preoxygenation: Pre-oxygenation with 100% oxygen Intubation Type: IV induction LMA: LMA inserted LMA Size: 4.0 Number of attempts: 1 Placement Confirmation: positive ETCO2 and breath sounds checked- equal and bilateral Tube secured with: Tape Dental Injury: Teeth and Oropharynx as per pre-operative assessment

## 2016-12-23 NOTE — Progress Notes (Signed)
Placed on tele to monitor  

## 2016-12-23 NOTE — Progress Notes (Signed)
Pt came up from PACU, R groin a level 1. Pt was told the importance of lying down. Will cont to monitor pt.

## 2016-12-24 ENCOUNTER — Encounter (HOSPITAL_COMMUNITY): Payer: Self-pay | Admitting: Vascular Surgery

## 2016-12-24 LAB — RENAL FUNCTION PANEL
Albumin: 2.1 g/dL — ABNORMAL LOW (ref 3.5–5.0)
Anion gap: 11 (ref 5–15)
BUN: 36 mg/dL — AB (ref 6–20)
CHLORIDE: 91 mmol/L — AB (ref 101–111)
CO2: 24 mmol/L (ref 22–32)
CREATININE: 4.37 mg/dL — AB (ref 0.44–1.00)
Calcium: 7.8 mg/dL — ABNORMAL LOW (ref 8.9–10.3)
GFR calc Af Amer: 10 mL/min — ABNORMAL LOW (ref >60)
GFR calc non Af Amer: 9 mL/min — ABNORMAL LOW (ref >60)
GLUCOSE: 79 mg/dL (ref 65–99)
Phosphorus: 5.4 mg/dL — ABNORMAL HIGH (ref 2.5–4.6)
Potassium: 4 mmol/L (ref 3.5–5.1)
Sodium: 126 mmol/L — ABNORMAL LOW (ref 135–145)

## 2016-12-24 LAB — CBC
HCT: 24.5 % — ABNORMAL LOW (ref 36.0–46.0)
Hemoglobin: 8.1 g/dL — ABNORMAL LOW (ref 12.0–15.0)
MCH: 32.1 pg (ref 26.0–34.0)
MCHC: 33.1 g/dL (ref 30.0–36.0)
MCV: 97.2 fL (ref 78.0–100.0)
PLATELETS: 293 10*3/uL (ref 150–400)
RBC: 2.52 MIL/uL — ABNORMAL LOW (ref 3.87–5.11)
RDW: 17.8 % — AB (ref 11.5–15.5)
WBC: 10 10*3/uL (ref 4.0–10.5)

## 2016-12-24 LAB — POCT ACTIVATED CLOTTING TIME
Activated Clotting Time: 197 seconds
Activated Clotting Time: 147 seconds

## 2016-12-24 MED ORDER — TRAMADOL HCL 50 MG PO TABS: 50.0000 mg | ORAL_TABLET | Freq: Two times a day (BID) | ORAL | Status: DC | PRN

## 2016-12-24 MED ORDER — OXYCODONE HCL 5 MG PO TABS
ORAL_TABLET | ORAL | Status: AC
  Filled 2016-12-24: qty 2

## 2016-12-24 MED ORDER — DARBEPOETIN ALFA 100 MCG/0.5ML IJ SOSY
PREFILLED_SYRINGE | INTRAMUSCULAR | Status: AC
  Filled 2016-12-24: qty 0.5

## 2016-12-24 NOTE — Procedures (Signed)
Patient seen and examined on Hemodialysis. Not in any pain currently QB 300 with 2 17g needles, UF goal 2 liters.  Will see how LLE pain is in dialysis today.  This may help pt/ family decide how aggressive to be with her care.. Treatment adjusted as needed.  Bufford ButtnerElizabeth Shaneque Merkle MD  [facility] pgr [phone] 8:36 AM

## 2016-12-24 NOTE — Progress Notes (Signed)
PT Cancellation Note  Patient Details Name: Jill Johnson MRN: [id number] DOB: 28-Oct-1941   Cancelled Treatment:    Reason Eval/Treat Not Completed: Patient at procedure or test/unavailable Pt at HD. Will follow up next available time.   Blake DivineShauna A Yuktha Kerchner 12/24/2016, 8:19 AM Mylo RedShauna Aqil Goetting, PT, DPT [phone]

## 2016-12-24 NOTE — Care Management Important Message (Signed)
Important Message  Patient Details  Name: Jill Johnson MRN: [id number] Date of Birth: [birth year]   Medicare Important Message Given:  Yes    Kyla BalzarineShealy, Pharoah Goggins [place] 12/24/2016, 10:43 AM

## 2016-12-24 NOTE — Progress Notes (Signed)
Progress Note  Patient Name: Jill Johnson Date of Encounter: 12/24/2016  Primary Cardiologist: Anne Fu  Subjective   Dialysis no foot pain yet.   Inpatient Medications    Scheduled Meds: . acetaminophen  1,000 mg Oral TID  . aspirin  81 mg Oral Daily  . atorvastatin  80 mg Oral q1800  . carvedilol  3.125 mg Oral BID WC  . cholecalciferol  2,000 Units Oral Daily  . darbepoetin (ARANESP) injection - DIALYSIS  100 mcg Intravenous Q Thu-HD  . feeding supplement (PRO-STAT SUGAR FREE 64)  30 mL Oral BID  . gabapentin  200 mg Oral BID  . heparin  5,000 Units Subcutaneous Q8H  . levothyroxine  112 mcg Oral QAC breakfast  . mouth rinse  15 mL Mouth Rinse BID  . mirtazapine  7.5 mg Oral QHS  . oxyCODONE      . oxyCODONE  10 mg Oral Q12H  . senna  1 tablet Oral QHS  . sodium chloride flush  3 mL Intravenous Q12H  . sucroferric oxyhydroxide  500 mg Oral TID WC  . ticagrelor  90 mg Oral BID  . vitamin B-12  1,500 mcg Oral Daily   Continuous Infusions: . sodium chloride 10 mL/hr at 2016/12/25 1235   PRN Meds: sodium chloride, acetaminophen, ALPRAZolam, dextromethorphan-guaiFENesin, nitroGLYCERIN, ondansetron (ZOFRAN) IV, oxyCODONE, senna-docusate, sodium chloride flush   Vital Signs    Vitals:   12/24/16 0747 12/24/16 0800 12/24/16 0830 12/24/16 0900  BP: (!) 145/54 (!) 158/54  (!) 115/59  Pulse: 60 63 63 (!) 53  Resp: 16 16    Temp:      TempSrc:      SpO2:      Weight:      Height:        Intake/Output Summary (Last 24 hours) at 12/24/16 0914 Last data filed at 12/24/16 0600  Gross per 24 hour  Intake             1050 ml  Output               75 ml  Net              975 ml   Filed Weights   12/22/16 0730 12/22/16 1105 12/24/16 0706  Weight: 160 lb 4.4 oz (72.7 kg) 154 lb 8.7 oz (70.1 kg) 155 lb 13.8 oz (70.7 kg)    Telemetry    NSR, no VT - Personally Reviewed  ECG    ST depression lateral improved - Personally Reviewed  Physical Exam   Affect  appropriate Chronically ill white female  HEENT: normal Neck supple with no adenopathy JVP normal no bruits no thyromegaly Lungs clear with no wheezing and good diaphragmatic motion Heart:  S1/S2 SEM murmur, no rub, gallop or click PMI normal LUE fistual  Abdomen: benighn, BS positve, no tenderness, no AAA no bruit.  No HSM or HJR Unable to palpate distal pulses with dressings on both shins  No edema Neuro non-focal Skin warm and dry RFA angiogram sight ok     Labs    Chemistry  Recent Labs Lab 12/19/16 0229  12/22/16 0739 2016-12-25 0530 12/24/16 0700  NA 137  < > 127* 129* 126*  K 3.7  < > 4.1 3.7 4.0  CL 98*  < > 92* 93* 91*  CO2 26  < > 23 25 24   GLUCOSE 73  < > 104* 83 79  BUN 30*  < > 46* 26* 36*  CREATININE  3.76*  < > 5.29* 3.40* 4.37*  CALCIUM 8.1*  < > 8.0* 7.7* 7.8*  PROT 5.4*  --   --   --   --   ALBUMIN 2.4*  --  2.3*  --  2.1*  AST 37  --   --   --   --   ALT 17  --   --   --   --   ALKPHOS 59  --   --   --   --   BILITOT 0.6  --   --   --   --   GFRNONAA 11*  < > 7* 12* 9*  GFRAA 12*  < > 8* 14* 10*  ANIONGAP 13  < > 12 11 11   < > = values in this interval not displayed.   Hematology  Recent Labs Lab 12/22/16 0739 12/11/2016 0530 12/24/16 0700  WBC 12.0* 10.4 10.0  RBC 2.65* 2.64* 2.52*  HGB 8.6* 8.4* 8.1*  HCT 25.2* 25.3* 24.5*  MCV 95.1 95.8 97.2  MCH 32.5 31.8 32.1  MCHC 34.1 33.2 33.1  RDW 17.2* 17.6* 17.8*  PLT 283 302 293    Cardiac Enzymes  Recent Labs Lab 06/30/2017 2108 12/19/16 0229 12/19/16 0822  TROPONINI 0.96* 2.11* 2.28*   No results for input(s): TROPIPOC in the last 168 hours.   BNPNo results for input(s): BNP, PROBNP in the last 168 hours.   DDimer No results for input(s): DDIMER in the last 168 hours.   Radiology    No results found.  Cardiac Studies   Cardiac catheterization 12/14/2016 Conclusion   1. Severe 2 vessel CAD with total occlusion of the LAD and total occlusion of the RCA 2. S/P CABG with  patency of the SVG-diag, critical stenosis of the SVG-PDA, and non-visualization of the LIMA-LAD 3. Successful PCI of the SVG-PDA with a DES platform  Recommend:  Load with Brilinta 180 mg when patient able to swallow medicine  Continue aggrastat at reduced dose until patient takes brilinta  DAPT with ASA/brilinta as tolerated at least 6 months. If she needs to switch to a less-potent thienopyridine for vascular surgery she could be switched to plavix.    Echocardiogram 10/31/16 - Left ventricle: The cavity size was normal. Wall thickness was   increased in a pattern of moderate LVH. Systolic function was   normal. The estimated ejection fraction was in the range of 60%   to 65%. Wall motion was normal; there were no regional wall   motion abnormalities. Features are consistent with a pseudonormal   left ventricular filling pattern, with concomitant abnormal   relaxation and increased filling pressure (grade 2 diastolic   dysfunction). - Aortic valve: Bioprosthesis in aortic position. There was trivial   regurgitation. Mean gradient (S): 29 mm Hg. Peak gradient (S): 51   mm Hg. VTI ratio of LVOT to aortic valve: 0.4. - Mitral valve: Calcified annulus. Mildly thickened leaflets .   There was mild to moderate regurgitation. - Left atrium: The atrium was severely dilated. - Tricuspid valve: There was trivial regurgitation. - Pulmonary arteries: Systolic pressure could not be accurately   estimated. - Pericardium, extracardiac: There was no pericardial effusion.  Impressions:  - Moderate LVH with LVEF 60-65% and grade 2 diastolic dysfunction.   Severe left atrial enlargement. Moderately calcified mitral   annulus with thickened leaflets and mild to moderate mitral   regurgitation. 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   Trivial aortic regurgitation noted. Gradients have increased  compared to the prior study with mean gradient 29 mmHg, although   the dimensionless index  does not suggest prosthetic valve   stenosis. Trivial tricuspid regurgitation, unable to assess PASP.  Patient Profile     76 y.o. female here with non-ST elevation myocardial infarction status post cardiac catheterization with SVG to PDA DES stent placement, post CABG 2011 with bioprosthetic aortic valve replacement, end-stage renal disease on hemodialysis, severe peripheral vascular disease with critical limb ischemia of left foot with ulcerated wounds, status post left upper arm AV fistula followed by Dr. Arbie Cookey,   Assessment & Plan    Non-ST elevation myocardial infarction  - SVG to PDA stent placement, DES.  - Dual antiplatelet therapy for at least 6 months  - Continue with Brilinta for now, can switch to Plavix if necessary in the future. Please see catheterization note.  - Aggressive secondary prevention, aspirin, Brilinta, atorvastatin, carvedilol  End-stage renal disease with hemodialysis  - Left upper arm fistula  - continue dialysis   Peripheral vascular disease with critical limb ischemia  - post stenting of left popliteal to improve inflow with good peroneal runoff Doubt there can be any further efforts to improved chance of limb salvage Plan per [facility] angiogram sight looks good    Hyperlipidemia  - Atorvastatin 80  Bioprosthetic aortic valve replacement 2011  - Antibiotic prophylaxis if necessary  - Recent echocardiogram in December 2017 showed normal function  Coronary artery disease status post CABG 2011  - See cardiac catheterization report as above.  - SVG to PDA stenting. DAT   Delirium  - [facility] consult. Confusion. Anxiety  Hypotension  - We will cancel losartan 50 mg and amlodipine for now  - Hold carvedilol  Anemia  Per [facility] consider transfusion to improve oxygenation to foot  Lab Results  Component Value Date   HCT 24.5 (L) 12/24/2016       Signed, Charlton Haws, MD  12/24/2016, 9:14 AM

## 2016-12-24 NOTE — Progress Notes (Addendum)
Patient ID: Jill Johnson, female   DOB: 11-07-1941, 76 y.o.   MRN: [id number]  PROGRESS NOTE    Jill Johnson  WUJ:[id number] DOB: 12/21/1940 DOA: 12/19/2016  PCP: Hollice Espy, MD   Brief Narrative:  76 year old female with past medical history significant for end-stage renal disease on hemodialysis (TTS) started about 2 months ago, CAD status post CABG and AVR (AV replacement), hypothyroidism, hypertension, recent critical limb ischemia. Initially under cardiology primary care and [facility] consulted for evaluation of delirium. Please note patient under [facility] primary care starting 12/22/2016.  Patient has had left leg/foot pain for past month or so prior to this admission. She saw Dr. early in clinic and the plan was for outpatient angiography. In short stay, prior to the procedure, patient started to have chest pain and EKG showed new ST depressions. Patient was taken to cath lab on 12/19/2016. She is status post cardiac catheterization with SVG to PDA DES stent placement.  Pt also underwent aortogram with bilateral LE runoff, left popliteal stenting on 12/22/2016.   Assessment & Plan:   Non-ST elevation myocardial infarction  - SVG to PDA stent placement, DES.  - Dual antiplatelet therapy for at least 6 months per cardiology   - Continue with Brilinta for now, aspirin. Per cario, possibly change to plavix in future  - Aggressive secondary prevention with as noted asa and brilinta as well as atorvastatin, carvedilol  End-stage renal disease with hemodialysis - Left upper arm fistula - Continue HD on TTS - Renal following   Peripheral vascular disease with critical limb ischemia - Per Dr. Arbie Cookey pt is very poor surgical candidate - S/P Aortogram with bilateral LE runoff, left popliteal stenting 01/03/2017 by Dr. Tawanna Cooler Early   Hyperlipidemia - Continue statin therapy   Bioprosthetic aortic valve replacement 2011  - Recent echocardiogram in December 2017 showed normal  function  Coronary artery disease status post CABG 2011  - SVG to PDA stenting  Anemia of chronic kidney disease - Hgb 8.6, 8.4 - Follow up CBC in am   Hyponatremia - Sodium 127 --> 129 --> 126 - Appreciate renal recommendations   Delirium - No significant changes in mental status  Hypothyroidism  - Continue synthroid   Bilateral LEs and left foot toes pressure injury  - Left medial LE:  3.5 x 2 with depth obscured by the presence of eschar, no exudate - Left lateral LE:  3cm x 1cm x 0.1cm partial thickness with pink, moist base and scant serous exudate - Right LE (anterior):  2.5cm x 1.5cm x 0.2cm full thickness wound with light yellow, moist wound bed and small amount of serous exudate - Dressing procedure/placement/frequency: Conservative wound care using betadine swab sticks to the left toes with eschar in an effort to keep them dry and infection free.  Prevalon Boots are provided bilaterally to protect from injury.  The LE wounds care twice daily with saline moistened gauze and topped with protective silicone foam.     DVT prophylaxis: Asa, brilinta  Code Status: full code  Family Communication: no family at the bedside this am; called pt son over the phone to give update 2/14 Disposition Plan: to SNF once determined stable from vascular and nephrology standpoint    Consultants:   Cardiology   Nephrology   Vascular surgery, Dr. Tawanna Cooler Early   Palliative care   WOC  Procedures:   ESDR on HD TTS  Aortogram with bilateral LE runoff, left popliteal stenting - 12/11/2016  Antimicrobials:  None    Subjective: No overnight events.   Objective: Vitals:   12/24/16 0747 12/24/16 0800 12/24/16 0830 12/24/16 0900  BP: (!) 145/54 (!) 158/54 (!) 154/48 (!) 115/59  Pulse: 60 63 63 (!) 53  Resp: 16 16    Temp:      TempSrc:      SpO2:      Weight:      Height:        Intake/Output Summary (Last 24 hours) at 12/24/16 1610 Last data filed at 12/24/16 0600   Gross per 24 hour  Intake             1050 ml  Output               75 ml  Net              975 ml   Filed Weights   12/22/16 0730 12/22/16 1105 12/24/16 0706  Weight: 72.7 kg (160 lb 4.4 oz) 70.1 kg (154 lb 8.7 oz) 70.7 kg (155 lb 13.8 oz)    Examination:  General exam: No acute distress  Respiratory system: NO wheezing, no rhonchi  Cardiovascular system: S1 & S2 heard, Rate controlled; SEM appreciate 2/6 Gastrointestinal system: (+) BS, non tender abdomen  Central nervous system: disoriented otherwise non focal  Extremities: No tenderness, palpable pulses  Skin: LLE necrotic wounds on medical aspect, L toes necrotic; LUA AVF Psychiatry: Disoriented, no agitation   Data Reviewed: I have personally reviewed following labs and imaging studies  CBC:  Recent Labs Lab 12/19/16 0822 12/20/16 0205 12/22/16 0739 01/04/2017 0530 12/24/16 0700  WBC 13.3* 14.5* 12.0* 10.4 10.0  HGB 7.2* 8.4* 8.6* 8.4* 8.1*  HCT 21.9* 25.5* 25.2* 25.3* 24.5*  MCV 100.0 94.8 95.1 95.8 97.2  PLT 271 262 283 302 293   Basic Metabolic Panel:  Recent Labs Lab 12/19/16 0229 12/19/16 0822 12/22/16 0739 12/24/2016 0530 12/24/16 0700  NA 137 135 127* 129* 126*  K 3.7 3.9 4.1 3.7 4.0  CL 98* 101 92* 93* 91*  CO2 26 22 23 25 24   GLUCOSE 73 72 104* 83 79  BUN 30* 33* 46* 26* 36*  CREATININE 3.76* 4.11* 5.29* 3.40* 4.37*  CALCIUM 8.1* 7.9* 8.0* 7.7* 7.8*  PHOS  --   --  4.7*  --  5.4*   GFR: Estimated Creatinine Clearance: 9.5 mL/min (by C-G formula based on SCr of 4.37 mg/dL (H)). Liver Function Tests:  Recent Labs Lab 12/19/16 0229 12/22/16 0739 12/24/16 0700  AST 37  --   --   ALT 17  --   --   ALKPHOS 59  --   --   BILITOT 0.6  --   --   PROT 5.4*  --   --   ALBUMIN 2.4* 2.3* 2.1*   No results for input(s): LIPASE, AMYLASE in the last 168 hours. No results for input(s): AMMONIA in the last 168 hours. Coagulation Profile:  Recent Labs Lab 01-12-2017 1547 12/22/2016 0530  INR 1.18  1.17   Cardiac Enzymes:  Recent Labs Lab 2017/01/12 2108 12/19/16 0229 12/19/16 0822  TROPONINI 0.96* 2.11* 2.28*   BNP (last 3 results) No results for input(s): PROBNP in the last 8760 hours. HbA1C: No results for input(s): HGBA1C in the last 72 hours. CBG:  Recent Labs Lab 12/20/16 1943  GLUCAP 138*   Lipid Profile: No results for input(s): CHOL, HDL, LDLCALC, TRIG, CHOLHDL, LDLDIRECT in the last 72 hours. Thyroid Function Tests: No results for  input(s): TSH, T4TOTAL, FREET4, T3FREE, THYROIDAB in the last 72 hours. Anemia Panel: No results for input(s): VITAMINB12, FOLATE, FERRITIN, TIBC, IRON, RETICCTPCT in the last 72 hours. Urine analysis:    Component Value Date/Time   COLORURINE YELLOW 10/28/2016 1600   APPEARANCEUR CLEAR 10/28/2016 1600   LABSPEC 1.011 10/28/2016 1600   PHURINE 6.5 10/28/2016 1600   GLUCOSEU NEGATIVE 10/28/2016 1600   HGBUR NEGATIVE 10/28/2016 1600   BILIRUBINUR NEGATIVE 10/28/2016 1600   KETONESUR NEGATIVE 10/28/2016 1600   PROTEINUR 30 (A) 10/28/2016 1600   UROBILINOGEN 1.0 01/30/2010 1104   NITRITE NEGATIVE 10/28/2016 1600   LEUKOCYTESUR NEGATIVE 10/28/2016 1600   Sepsis Labs: @LABRCNTIP (procalcitonin:4,lacticidven:4)    Recent Results (from the past 240 hour(s))  MRSA PCR Screening     Status: None   Collection Time: 12/16/2016  8:18 PM  Result Value Ref Range Status   MRSA by PCR NEGATIVE NEGATIVE Final      Radiology Studies: Dg Chest Port 1 View Result Date: 12/19/2016 Cardiomegaly with central vascular congestion. Possible small left effusion. No overt pulmonary edema.    Scheduled Meds: . acetaminophen  1,000 mg Oral TID  . aspirin  81 mg Oral Daily  . atorvastatin  80 mg Oral q1800  . carvedilol  3.125 mg Oral BID WC  .  ceFAZolin IV  1 g Intravenous To OR  . cholecalciferol  2,000 Units Oral Daily  . gabapentin  200 mg Oral BID  . mouth rinse  15 mL Mouth Rinse BID  . sucroferric oxyhydroxide  500 mg Oral TID WC    . ticagrelor  90 mg Oral BID  . vitamin B-12  1,500 mcg Oral Daily   Continuous Infusions: . sodium chloride 10 mL/hr at 2017-06-08 1235     LOS: 6 days    Time spent: 15 minutes  Greater than 50% of the time spent on counseling and coordinating the care.   Manson PasseyEVINE, Sherri Mcarthy, MD [facility] Pager [phone]  If 7PM-7AM, please contact night-coverage [email or web address] Password [email or web address] 12/24/2016, 9:18 AM

## 2016-12-24 NOTE — Progress Notes (Signed)
Pt agitated and confused.  Saying incoherent things.  Son at bedside states pt does not take Oxycontin/Oxycodone IR and would like RN to ask attending MD to d/c this med and if possible restart Home Tramadol.  MD paged and orders to d/c Oxycodone entered.  Tramadol q8hr prn is now ordered.  Palliative Care team aware of change, but do not agree completely with Tramadol.   If Tramadol does not work, may need to try only Oxy IR, per Palliative.  Vital Signs are stable.  Will continue to monitor.  Pt in a camera room and NT/Secretary aware of pt needing to be monitored closely.

## 2016-12-24 NOTE — NC FL2 (Signed)
[place] MEDICAID FL2 LEVEL OF CARE SCREENING TOOL     IDENTIFICATION  Patient Name: Jill Johnson Birthdate: 07/27/1941 Sex: female Admission Date (Current Location): 12/14/2016  [facility] [place]   Facility and Address:  [facility], [address], [place], [place] [address]      Provider Number: [id number]  Attending Physician Name and Address:  Alison MurrayAlma M Devine, MD  Relative Name and Phone Number:       Current Level of Care: Hospital Recommended Level of Care: Assisted Living Facility Prior Approval Number:    Date Approved/Denied:   PASRR Number:  [id number])  Discharge Plan: ALF   Current Diagnoses: Patient Active Problem List   Diagnosis Date Noted  . Other specified hypothyroidism   . Goals of care, counseling/discussion   . Advance care planning   . Unstable angina (HCC) 2016/11/23  . Anemia, chronic disease 2016/11/23  . ESRD on hemodialysis (HCC) 2016/11/23  . PAD (peripheral artery disease) (HCC) 2016/11/23  . Critical lower limb ischemia 2016/11/23  . Coronary artery disease involving native coronary artery of native heart with unstable angina pectoris (HCC) 2016/11/23  . Chest pain 10/28/2016  . Fluid overload 10/28/2016  . Elevated troponin 10/28/2016  . Symptomatic anemia 10/28/2016  . ESRD (end stage renal disease) (HCC) 10/28/2016  . Hypothyroidism 10/28/2016  . Respiratory distress 10/28/2016  . Acute diastolic heart failure (HCC) 12/20/2015  . Obesity 12/20/2015  . Coronary artery disease due to lipid rich plaque 12/20/2015  . S/P AVR (aortic valve replacement) 12/20/2015  . Chronic kidney disease, stage 3 12/20/2015    Orientation RESPIRATION BLADDER Height & Weight     Self, Time, Situation, Place  Normal Continent Weight: 70.7 kg (155 lb 13.8 oz) Height:  5' (152.4 cm)  BEHAVIORAL SYMPTOMS/MOOD NEUROLOGICAL BOWEL NUTRITION STATUS   (none)  (none) Continent Diet (Heart Healthy )   AMBULATORY STATUS COMMUNICATION OF NEEDS Skin   Extensive Assist (2 assist for trasnfers and ambulation) Verbally Other (Comment) (Incision groin right. nNon-pressure wound leg right. Diabetic ulcer foot left eschar to toes. Venous stasis ulcer leg left.)                       Personal Care Assistance Level of Assistance  Bathing, Dressing, Feeding Bathing Assistance: Limited assistance Feeding assistance: Independent Dressing Assistance: Limited assistance     Functional Limitations Info  Speech, Hearing, Sight Sight Info: Adequate Hearing Info: Adequate Speech Info: Adequate    SPECIAL CARE FACTORS FREQUENCY  PT (By licensed PT), OT (By licensed OT)     PT Frequency: 3 week OT Frequency: 3 week            Contractures Contractures Info: Not present    Additional Factors Info  Code Status, Allergies Code Status Info: FULL CODE  Allergies Info: Prednisone           Current Medications (12/24/2016):  This is the current hospital active medication list Current Facility-Administered Medications  Medication Dose Route Frequency Provider Last Rate Last Dose  . 0.9 %  sodium chloride infusion   Intravenous Continuous Gaynelle AduWilliam Fitzgerald, MD 10 mL/hr at 12/30/2016 1235    . 0.9 %  sodium chloride infusion  250 mL Intravenous PRN Larina Earthlyodd F Early, MD      . acetaminophen (TYLENOL) tablet 1,000 mg  1,000 mg Oral TID Alberteen Samhristopher P Danford, MD   1,000 mg at 12/30/2016 2119  . acetaminophen (TYLENOL) tablet 650 mg  650 mg Oral Q4H PRN Larina Earthly, MD      . ALPRAZolam Prudy Feeler) tablet 0.25 mg  0.25 mg Oral TID PRN Barbara Cower, NP   0.25 mg at 12/24/16 0644  . aspirin chewable tablet 81 mg  81 mg Oral Daily Tonny Bollman, MD   81 mg at 12-26-16 1102  . atorvastatin (LIPITOR) tablet 80 mg  80 mg Oral q1800 Rhonda G Barrett, PA-C   80 mg at 12/22/16 1658  . carvedilol (COREG) tablet 3.125 mg  3.125 mg Oral BID WC Rhonda G Barrett, PA-C   3.125 mg at 26-Dec-2016 1103  . cholecalciferol  (VITAMIN D) tablet 2,000 Units  2,000 Units Oral Daily Tonny Bollman, MD   2,000 Units at December 26, 2016 1104  . Darbepoetin Alfa (ARANESP) 100 MCG/0.5ML injection           . Darbepoetin Alfa (ARANESP) injection 100 mcg  100 mcg Intravenous Q Thu-HD Bufford Buttner, MD   100 mcg at 12/24/16 [phone]  . dextromethorphan-guaiFENesin (MUCINEX DM) 30-600 MG per 12 hr tablet 1 tablet  1 tablet Oral Daily PRN Lennette Bihari, MD      . feeding supplement (PRO-STAT SUGAR FREE 64) liquid 30 mL  30 mL Oral BID Bufford Buttner, MD      . gabapentin (NEURONTIN) capsule 200 mg  200 mg Oral BID Alberteen Sam, MD   200 mg at 2016/12/26 2119  . heparin injection 5,000 Units  5,000 Units Subcutaneous Q8H Larina Earthly, MD   5,000 Units at 12/24/16 [phone]  . levothyroxine (SYNTHROID, LEVOTHROID) tablet 112 mcg  112 mcg Oral QAC breakfast Tonny Bollman, MD   112 mcg at Dec 26, 2016 1058  . MEDLINE mouth rinse  15 mL Mouth Rinse BID [name], MD   15 mL at Dec 26, 2016 1000  . mirtazapine (REMERON) tablet 7.5 mg  7.5 mg Oral QHS Barbara Cower, NP   7.5 mg at 2016-12-26 2129  . nitroGLYCERIN (NITROSTAT) SL tablet 0.4 mg  0.4 mg Sublingual Q5 Min x 3 PRN Rhonda G Barrett, PA-C      . ondansetron (ZOFRAN) injection 4 mg  4 mg Intravenous Q6H PRN Rhonda G Barrett, PA-C   4 mg at December 26, 2016 0150  . oxyCODONE (Oxy IR/ROXICODONE) 5 MG immediate release tablet           . oxyCODONE (Oxy IR/ROXICODONE) immediate release tablet 5 mg  5 mg Oral Q4H PRN Alberteen Sam, MD   5 mg at 12/24/16 0901  . oxyCODONE (OXYCONTIN) 12 hr tablet 10 mg  10 mg Oral Q12H Barbara Cower, NP   10 mg at 12-26-2016 2155  . senna (SENOKOT) tablet 8.6 mg  1 tablet Oral QHS Barbara Cower, NP      . senna-docusate (Senokot-S) tablet 1 tablet  1 tablet Oral QHS PRN Tonny Bollman, MD   1 tablet at 12/19/16 2103  . sodium chloride flush (NS) 0.9 % injection 3 mL  3 mL Intravenous Q12H Larina Earthly, MD   3 mL at 12/24/16 0445  . sodium chloride flush (NS) 0.9 %  injection 3 mL  3 mL Intravenous PRN Larina Earthly, MD      . sucroferric oxyhydroxide [facility]) chewable tablet 500 mg  500 mg Oral TID WC Bufford Buttner, MD   500 mg at 12/22/16 1659  . ticagrelor (BRILINTA) tablet 90 mg  90 mg Oral BID Tonny Bollman, MD   90 mg at December 26, 2016 2140  . vitamin  B-12 (CYANOCOBALAMIN) tablet 1,500 mcg  1,500 mcg Oral Daily Lennette Bihari, MD   1,500 mcg at 12/30/2016 1101     Discharge Medications: Please see discharge summary for a list of discharge medications.  Relevant Imaging Results:  Relevant Lab Results:   Additional Information SSN [id number] - HD TTS at [facility].  Venita Lick, LCSW

## 2016-12-24 NOTE — Progress Notes (Signed)
Subjective: Interval History: none.. Currently on hemodialysis. Sleeping. Less agitated when awakened. Reports episode of severe left arm pain yesterday. Continues to have pain in her left hip and reports that her left foot hurts as well.  Objective: Vital signs in last 24 hours: Temp:  [97 F (36.1 C)-98.4 F (36.9 C)] 98.4 F (36.9 C) (02/15 0328) Pulse Rate:  [52-67] 63 (02/15 0800) Resp:  [11-16] 16 (02/15 0800) BP: (128-160)/(32-95) 158/54 (02/15 0800) SpO2:  [94 %-100 %] 95 % (02/15 0328)  Intake/Output from previous day: 02/14 0701 - 02/15 0700 In: 1050 [P.O.:400; I.V.:650] Out: 75 [Blood:75] Intake/Output this shift: No intake/output data recorded.  Right groin puncture without hematoma. Bruising from heart cath resolving. Left foot looks better today. Warm. Demarcating her second and fourth toe.  Lab Results:  Recent Labs  01/02/2017 0530 12/24/16 0700  WBC 10.4 10.0  HGB 8.4* 8.1*  HCT 25.3* 24.5*  PLT 302 293   BMET  Recent Labs  12/12/2016 0530 12/24/16 0700  NA 129* 126*  K 3.7 4.0  CL 93* 91*  CO2 25 24  GLUCOSE 83 79  BUN 26* 36*  CREATININE 3.40* 4.37*  CALCIUM 7.7* 7.8*    Studies/Results: Dg Chest Port 1 View  Result Date: 12/19/2016 CLINICAL DATA:  Early termination of dialysis diminished lung sounds EXAM: PORTABLE CHEST 1 VIEW COMPARISON:  10/28/2016 FINDINGS: Post sternotomy changes and valvular prosthesis. Cardiomegaly is present. Central vascular congestion without overt edema. Possible small left pleural effusion. Atherosclerosis. No pneumothorax. IMPRESSION: Cardiomegaly with central vascular congestion. Possible small left effusion. No overt pulmonary edema. Electronically Signed   By: Jasmine PangKim  Fujinaga M.D.   On: 12/19/2016 23:59   Anti-infectives: Anti-infectives    Start     Dose/Rate Route Frequency Ordered Stop   01/02/2017 1215  ceFAZolin (ANCEF) 2-4 GM/100ML-% IVPB    Comments:  Scronce, Trina   : cabinet override      01/03/2017 1215  12/24/16 0029   12/12/2016 0600  ceFAZolin (ANCEF) IVPB 1 g/50 mL premix    Comments:  Send with pt to OR   1 g 100 mL/hr over 30 Minutes Intravenous To Surgery 12/22/16 0815 12/24/16 0600      Assessment/Plan: s/p Procedure(s): AORTOGRAM WITH BILATERAL LOWER EXTREMITY RUNOFF; LEFT POPLITEAL STENTING (N/A) Stable status post endovascular crossing of popliteal occlusion and stenting of her popliteal artery with covered stent. We will provide adequate inflow for left foot salvage. Following with you.   LOS: 6 days   Gretta Beganarly, Todd 12/24/2016, 8:37 AM

## 2016-12-24 NOTE — Clinical Social Work Note (Signed)
Clinical information sent to SNFs and ALFs. Family would like patient evaluated by physical therapy to determine if the patient is capable of going to ALF or is SNF will be needed at discharge. The patient has already been set up with [facility], but if SN[facility] is needed [facility].  Bryant Ahnna Dungan MSW, [place] Valley[place]CS[place], [place], [phone]

## 2016-12-24 NOTE — Anesthesia Postprocedure Evaluation (Signed)
Anesthesia Post Note  Patient: Dorthula PerfectNancy L Geerdes  Procedure(s) Performed: Procedure(s) (LRB): AORTOGRAM WITH BILATERAL LOWER EXTREMITY RUNOFF; LEFT POPLITEAL STENTING (N/A)  Patient location during evaluation: PACU Anesthesia Type: General Level of consciousness: awake and alert Pain management: pain level controlled Vital Signs Assessment: post-procedure vital signs reviewed and stable Respiratory status: spontaneous breathing, nonlabored ventilation and respiratory function stable Cardiovascular status: blood pressure returned to baseline and stable Postop Assessment: no signs of nausea or vomiting Anesthetic complications: no       Last Vitals:  Vitals:   12/10/2016 2155 12/24/16 0328  BP: (!) 128/95 (!) 145/40  Pulse: 67 (!) 55  Resp: 11 12  Temp:  36.9 C    Last Pain:  Vitals:   12/24/16 0328  TempSrc: Oral  PainSc:                  Shanitha Twining,W. EDMOND

## 2016-12-24 NOTE — Progress Notes (Signed)
Daily Progress Note   Patient Name: Jill Johnson       Date: 12/24/2016 DOB: 1941/07/04  Age: 76 y.o. MRN#: [id number] Attending Physician: Alison Murray, MD Primary Care Physician: Hollice Espy, MD Admit Date: 12/23/2016  Reason for Consultation/Follow-up: Establishing goals of care  Subjective: Patient seen and examined this afternoon.  She was sleepy but easily arousable.  Denies pain or complaints.  Reports no needs at this time.  Her bedside RN reports that she was noted to be acting strange/confused by family with concern this may be related to oxycodone.  Dr. Elisabeth Pigeon notified and has transitioned back to home medication of tramadol.  Length of Stay: 6  Current Medications: Scheduled Meds:  . acetaminophen  1,000 mg Oral TID  . aspirin  81 mg Oral Daily  . atorvastatin  80 mg Oral q1800  . carvedilol  3.125 mg Oral BID WC  . cholecalciferol  2,000 Units Oral Daily  . Darbepoetin Alfa      . darbepoetin (ARANESP) injection - DIALYSIS  100 mcg Intravenous Q Thu-HD  . feeding supplement (PRO-STAT SUGAR FREE 64)  30 mL Oral BID  . gabapentin  200 mg Oral BID  . heparin  5,000 Units Subcutaneous Q8H  . levothyroxine  112 mcg Oral QAC breakfast  . mouth rinse  15 mL Mouth Rinse BID  . mirtazapine  7.5 mg Oral QHS  . oxyCODONE      . senna  1 tablet Oral QHS  . sodium chloride flush  3 mL Intravenous Q12H  . sucroferric oxyhydroxide  500 mg Oral TID WC  . ticagrelor  90 mg Oral BID  . vitamin B-12  1,500 mcg Oral Daily    Continuous Infusions: . sodium chloride 10 mL/hr at 12/22/2016 1235    PRN Meds: sodium chloride, acetaminophen, ALPRAZolam, dextromethorphan-guaiFENesin, nitroGLYCERIN, ondansetron (ZOFRAN) IV, senna-docusate, sodium chloride flush, traMADol  Physical  Exam         General: Sleeping but arouses easily, in no acute distress. Denies pain HEENT: No bruits, no goiter, no JVD Heart: Regular rate and rhythm. No murmur appreciated. Lungs: Good air movement, clear Abdomen: Soft, nontender, nondistended, positive bowel sounds.  Ext: + edema Skin: Warm and dry Neuro: Grossly intact, nonfocal.  Vital Signs: BP (!) 144/82   Pulse 68   Temp  97.5 F (36.4 C) (Oral)   Resp 15   Ht 5' (1.524 m)   Wt 70.7 kg (155 lb 13.8 oz)   SpO2 96%   BMI 30.44 kg/m  SpO2: SpO2: 96 % O2 Device: O2 Device: Not Delivered O2 Flow Rate: O2 Flow Rate (L/min): 3 L/min  Intake/output summary:  Intake/Output Summary (Last 24 hours) at 12/24/16 1854 Last data filed at 12/24/16 6045  Gross per 24 hour  Intake              400 ml  Output              880 ml  Net             -480 ml   LBM: Last BM Date: 12/22/16 Baseline Weight: Weight: 71.7 kg (158 lb) Most recent weight: Weight: 70.7 kg (155 lb 13.8 oz)       Palliative Assessment/Data:    Flowsheet Rows   Flowsheet Row Most Recent Value  Intake Tab  Referral Department  Hospitalist  Unit at Time of Referral  Cardiac/Telemetry Unit  Palliative Care Primary Diagnosis  Cardiac  Date Notified  12/22/16  Palliative Care Type  New Palliative care  Reason for referral  Clarify Goals of Care  Date of Admission  01-15-17  # of days IP prior to Palliative referral  4  Clinical Assessment  Psychosocial & Spiritual Assessment  Palliative Care Outcomes      Patient Active Problem List   Diagnosis Date Noted  . Other specified hypothyroidism   . Goals of care, counseling/discussion   . Advance care planning   . Unstable angina (HCC) 01/15/2017  . Anemia, chronic disease Jan 15, 2017  . ESRD on hemodialysis (HCC) 01-15-17  . PAD (peripheral artery disease) (HCC) 2017-01-15  . Critical lower limb ischemia 2017-01-15  . Coronary artery disease involving native coronary artery of native heart with  unstable angina pectoris (HCC) 01-15-2017  . Chest pain 10/28/2016  . Fluid overload 10/28/2016  . Elevated troponin 10/28/2016  . Symptomatic anemia 10/28/2016  . ESRD (end stage renal disease) (HCC) 10/28/2016  . Hypothyroidism 10/28/2016  . Respiratory distress 10/28/2016  . Acute diastolic heart failure (HCC) 12/20/2015  . Obesity 12/20/2015  . Coronary artery disease due to lipid rich plaque 12/20/2015  . S/P AVR (aortic valve replacement) 12/20/2015  . Chronic kidney disease, stage 3 12/20/2015    Palliative Care Assessment & Plan   Patient Profile: 76 y.o. female  with past medical history of ESRD (on HD), HLD, HTN, CAD s/p CABG (2011), TIA, CVA, critical limb ischemia admitted on 01-15-17 with chest pain. She had presented for scheduled lower limb angiography, however, before the procedure she was found to have chest pain and EKG revealed ST depression. She is now s/p cardiac cath with stent placement. Palliative medicine consulted for GOC.   Recommendations/Plan:  Pain: Patient reported to be confused and concern this is related to oxycodone.  She has already been rotated to tramadol, which she has taken before with good effect.  Agree with opioid rotation and will continue to follow for pain management/side effects.   Code Status:    Code Status Orders        Start     Ordered   12/15/2016 1045  Do not attempt resuscitation (DNR)  Continuous    Question Answer Comment  In the event of cardiac or respiratory ARREST Do not call a "code blue"   In the event of cardiac or respiratory ARREST  Do not perform Intubation, CPR, defibrillation or ACLS   In the event of cardiac or respiratory ARREST Use medication by any route, position, wound care, and other measures to relive pain and suffering. May use oxygen, suction and manual treatment of airway obstruction as needed for comfort.      02/08/17 1044    Code Status History    Date Active Date Inactive Code Status Order ID  Comments User Context   12/24/2016  8:36 PM 2017-02-17 10:44 AM Full Code [id number]  Tonny BollmanMichael Cooper, MD Inpatient   12/13/2016  8:34 PM 12/16/2016  8:36 PM Full Code [id number]  Darrol Jumphonda G Barrett, PA-C Inpatient   10/28/2016  8:28 PM 11/07/2016  7:19 PM Full Code [id number]  Clydie Braunondell A Smith, MD Inpatient    Advance Directive Documentation   Flowsheet Row Most Recent Value  Type of Advance Directive  Healthcare Power of Attorney  Pre-existing out of facility DNR order (yellow form or pink MOST form)  No data  "MOST" Form in Place?  No data       Prognosis:   Unable to determine  Discharge Planning:  Skilled Nursing Facility for rehab with Palliative care service follow-up vs ALF  Care plan was discussed with patient, bedside RN  Thank you for allowing the Palliative Medicine Team to assist in the care of this patient.   Total Time 20 Prolonged Time Billed  no       Greater than 50%  of this time was spent counseling and coordinating care related to the above assessment and plan.  Romie MinusGene Evangela Heffler, MD  Please contact Palliative Medicine Team phone at [phone] for questions and concerns.

## 2016-12-24 NOTE — Evaluation (Signed)
Physical Therapy Evaluation Patient Details Name: Jill Johnson MRN: [id number] DOB: 05-14-1941 Today's Date: 12/24/2016   History of Present Illness  Patient is a 76 y/o female with hx of HTN, HLD, CKD, CAD s/p CABG, AVR, depression, severe PVD with critical limb ischemia of left foot with ulcerated wounds, ESRD on HD, hypothyroidiam admitted for outpatient angiogram. In short stay, pt with chest pain. Found to have NSTEMI s/p cardiac catheterization with SVG to PDA DES stent placement.   Clinical Impression  Patient presents with generalized weakness, pain and impaired mobility s/p above. Pt distracted by pain, anxiety and fear of falling. Pt visibly upset, frustrated, tearful and emotionally labile during session. Mobility assessment limited due to lack of effort/willingness to participate secondary to pain. Pt not safe to return home at this time as she is requiring Max A of 2 to half stand from chair. Per family, pt has had a steady decline since being d/ced from rehab in January requiring assist for all care and mainly immobile with multiple falls. Would benefit from SNF to maximize independence and mobility prior to transfer to ALF. Will follow acutely.    Follow Up Recommendations SNF;Supervision for mobility/OOB;Supervision/Assistance - 24 hour    Equipment Recommendations  None recommended by PT    Recommendations for Other Services       Precautions / Restrictions Precautions Precautions: Fall Restrictions Weight Bearing Restrictions: No      Mobility  Bed Mobility               General bed mobility comments: Up in chair upon PT arrival.  Transfers Overall transfer level: Needs assistance Equipment used: Rolling walker (2 wheeled) Transfers: Sit to/from Stand Sit to Stand: Max assist;+2 physical assistance         General transfer comment: Assist of 2 to attempt standing from low chair, however pt resisting; able to get to half standing but not fully upright,  "watch me fall onto the floor."  Ambulation/Gait                Stairs            Wheelchair Mobility    Modified Rankin (Stroke Patients Only)       Balance Overall balance assessment: Needs assistance Sitting-balance support: Feet supported;No upper extremity supported Sitting balance-Leahy Scale: Fair     Standing balance support: During functional activity Standing balance-Leahy Scale: Zero                               Pertinent Vitals/Pain Pain Assessment: Faces Faces Pain Scale: Hurts whole lot Pain Location: LLE Pain Descriptors / Indicators: Sore;Aching Pain Intervention(s): Monitored during session;Repositioned;Patient requesting pain meds-RN notified;Limited activity within patient's tolerance    Home Living Family/patient expects to be discharged to:: Skilled nursing facility                      Prior Function Level of Independence: Needs assistance   Gait / Transfers Assistance Needed: Pt recently d/ced from SNF 1/15 and was ambulating with RW. once pt got home, she stopped moving per family.  ADL's / Homemaking Assistance Needed: total A for ADls.        Hand Dominance        Extremity/Trunk Assessment   Upper Extremity Assessment Upper Extremity Assessment: Defer to OT evaluation    Lower Extremity Assessment Lower Extremity Assessment: Generalized weakness (Declined any MMT due to  pain and confusion)       Communication   Communication: No difficulties  Cognition Arousal/Alertness: Awake/alert Behavior During Therapy: Anxious (emotional labile; upset) Overall Cognitive Status: Impaired/Different from baseline Area of Impairment: Problem solving;Attention   Current Attention Level: Sustained           General Comments: Distracted by pain, fearful of falling. Confused most likely related to opiates. Upset and frustrated about care and decline in health.    General Comments General comments (skin  integrity, edema, etc.): Son and daughter in law present outside room during session.    Exercises     Assessment/Plan    PT Assessment Patient needs continued PT services  PT Problem List Decreased strength;Decreased mobility;Decreased safety awareness;Decreased activity tolerance;Decreased cognition;Pain;Decreased balance          PT Treatment Interventions Therapeutic activities;Gait training;Therapeutic exercise;Patient/family education;Balance training;Functional mobility training;Neuromuscular re-education;DME instruction;Wheelchair mobility training    PT Goals (Current goals can be found in the Care Plan section)  Acute Rehab PT Goals Patient Stated Goal: to be able to get out of bed and walk around my house PT Goal Formulation: With patient Time For Goal Achievement: 01/07/17 Potential to Achieve Goals: Fair    Frequency Min 2X/week   Barriers to discharge Decreased caregiver support lives alone    Co-evaluation               End of Session Equipment Utilized During Treatment: Gait belt Activity Tolerance: Other (comment) (anxiety, pain, unwillingness) Patient left: in chair;with call bell/phone within reach;with chair alarm set;with family/visitor present Nurse Communication: Mobility status;Need for lift equipment         Time: [id number] PT Time Calculation (min) (ACUTE ONLY): 36 min   Charges:   PT Evaluation $PT Eval Moderate Complexity: 1 Procedure PT Treatments $Therapeutic Activity: 8-22 mins   PT G Codes:        Rayshawn Visconti A Dedee Liss 12/24/2016, 3:29 PM Mylo RedShauna Jaymison Luber, PT, DPT [phone]

## 2016-12-24 NOTE — Progress Notes (Signed)
[facility] Progress Note   Subjective: s/p L popliteal artery angio and stenting yesterday.  Sleepy this AM ; not in any pain.  Difficulty cannulating fistula today.  Vitals:   12/26/2016 2155 12/24/16 0328 12/24/16 0747 12/24/16 0800  BP: (!) 128/95 (!) 145/40 (!) 145/54 (!) 158/54  Pulse: 67 (!) 55 60 63  Resp: 11 12 16 16   Temp:  98.4 F (36.9 C)    TempSrc:  Oral    SpO2: 94% 95%    Weight:      Height:        Inpatient medications: . acetaminophen  1,000 mg Oral TID  . aspirin  81 mg Oral Daily  . atorvastatin  80 mg Oral q1800  . carvedilol  3.125 mg Oral BID WC  . cholecalciferol  2,000 Units Oral Daily  . darbepoetin (ARANESP) injection - DIALYSIS  100 mcg Intravenous Q Thu-HD  . feeding supplement (PRO-STAT SUGAR FREE 64)  30 mL Oral BID  . gabapentin  200 mg Oral BID  . heparin  5,000 Units Subcutaneous Q8H  . levothyroxine  112 mcg Oral QAC breakfast  . mouth rinse  15 mL Mouth Rinse BID  . mirtazapine  7.5 mg Oral QHS  . oxyCODONE  10 mg Oral Q12H  . senna  1 tablet Oral QHS  . sodium chloride flush  3 mL Intravenous Q12H  . sucroferric oxyhydroxide  500 mg Oral TID WC  . ticagrelor  90 mg Oral BID  . vitamin B-12  1,500 mcg Oral Daily   . sodium chloride 10 mL/hr at 01/01/2017 1235   sodium chloride, acetaminophen, ALPRAZolam, dextromethorphan-guaiFENesin, nitroGLYCERIN, ondansetron (ZOFRAN) IV, oxyCODONE, senna-docusate, sodium chloride flush  Exam: Gen lying in bed, sleepy NECK No jvd or bruits PULM CTAB no c/w/r CV RRR III/VI harsh systolic murmur RUSB Abd soft ntnd no mass or ascites +bs      Ext L lower extremity with necrotic wounds on medial aspect, dressed today. L foot warmer than prior, L toes remain necrotic Neuro: drifts off to sleep mid-conversation LUA AVF, accessed at present with 17g needles. + new areas of infiltration   Dialysis: TTS NW 4h  67kg   2/2.25 bath  Hep 2000  LUA AVF   16ga needles (does not want 15 ga)  -  darbe 200/ wk last 2/8  - venofer 100 mg/ hd, one dose left  - last Hb 8, Ca 9, P 5.8, pth 251  - tums 1 ac, cozaar 50 hs  Assessment: 1.  Acute MI - sp PCI to SVG-PDA 2/9. Started on [place]; on DAPT 2.  ESRD on HD TTS - started HD 6 wks ago.  HD today.  Problems with infiltration.  It would be optimal to rest the fistula- ideally should have TDC placement but likely not possible in the setting of DAPT requirement.  Could do nontunneled cath for a couple of treatments.  Will need to discuss this with the pt and her sons when she is more awake and alert.   She is having a hard time coping with dialysis.  Her LLE pain is expectedly worse during dialysis and if there are no options to mitigate critical limb ischemia it may be appropriate to discuss shifting focus of care to symptom management/ comfort rather than aggressive care.  Appreciate palliative care involvement.  We will see how her pain is during dialysis today. 3.  PAD / L leg ulcers - per VVS, sig disease LLE; s/p L popliteal  stenting 4.  HTN/ vol - does not appear overloaded.  EDWs being gotten in the bed. 5.  Hx CABG/ bio-AVR 2011 6.  Anemia of CKD - Hb in the mid 8s.  Aranesp 100 q Thursday. Completed an iron load as an outpatient.   7.  MBD - Phoslo 2 capsules AC before meals- in setting of bad atherosclerosis, changing to velphoro 1 AC TID; following Phos 8.  Nutrition: albumin 2.3, prostat    Bufford ButtnerElizabeth Tahara Ruffini MD [facility] pgr [phone] 12/24/2016, 8:25 AM    Recent Labs Lab 12/22/16 0739 Mar 20, 2017 0530 12/24/16 0700  NA 127* 129* 126*  K 4.1 3.7 4.0  CL 92* 93* 91*  CO2 23 25 24   GLUCOSE 104* 83 79  BUN 46* 26* 36*  CREATININE 5.29* 3.40* 4.37*  CALCIUM 8.0* 7.7* 7.8*  PHOS 4.7*  --  5.4*    Recent Labs Lab 12/19/16 0229 12/22/16 0739 12/24/16 0700  AST 37  --   --   ALT 17  --   --   ALKPHOS 59  --   --   BILITOT 0.6  --   --   PROT 5.4*  --   --   ALBUMIN 2.4* 2.3* 2.1*    Recent  Labs Lab 12/22/16 0739 Mar 20, 2017 0530 12/24/16 0700  WBC 12.0* 10.4 10.0  HGB 8.6* 8.4* 8.1*  HCT 25.2* 25.3* 24.5*  MCV 95.1 95.8 97.2  PLT 283 302 293   Iron/TIBC/Ferritin/ %Sat    Component Value Date/Time   IRON 49 10/20/2016 0929   TIBC 354 10/20/2016 0929   FERRITIN 387 (H) 10/20/2016 0929   IRONPCTSAT 14 10/20/2016 0929

## 2016-12-25 ENCOUNTER — Inpatient Hospital Stay (HOSPITAL_COMMUNITY): Payer: Medicare HMO

## 2016-12-25 ENCOUNTER — Encounter (HOSPITAL_COMMUNITY): Payer: Self-pay | Admitting: Interventional Radiology

## 2016-12-25 DIAGNOSIS — E876 Hypokalemia: Secondary | ICD-10-CM

## 2016-12-25 DIAGNOSIS — Z515 Encounter for palliative care: Secondary | ICD-10-CM

## 2016-12-25 HISTORY — PX: IR GENERIC HISTORICAL: IMG1180011

## 2016-12-25 LAB — BASIC METABOLIC PANEL
ANION GAP: 9 (ref 5–15)
BUN: 24 mg/dL — ABNORMAL HIGH (ref 6–20)
CALCIUM: 7.4 mg/dL — AB (ref 8.9–10.3)
CO2: 28 mmol/L (ref 22–32)
Chloride: 92 mmol/L — ABNORMAL LOW (ref 101–111)
Creatinine, Ser: 3.89 mg/dL — ABNORMAL HIGH (ref 0.44–1.00)
GFR, EST AFRICAN AMERICAN: 12 mL/min — AB (ref >60)
GFR, EST NON AFRICAN AMERICAN: 10 mL/min — AB (ref >60)
Glucose, Bld: 96 mg/dL (ref 65–99)
Potassium: 3.5 mmol/L (ref 3.5–5.1)
SODIUM: 129 mmol/L — AB (ref 135–145)

## 2016-12-25 LAB — CBC
HCT: 23.8 % — ABNORMAL LOW (ref 36.0–46.0)
HEMOGLOBIN: 7.8 g/dL — AB (ref 12.0–15.0)
MCH: 32.1 pg (ref 26.0–34.0)
MCHC: 32.8 g/dL (ref 30.0–36.0)
MCV: 97.9 fL (ref 78.0–100.0)
PLATELETS: 294 10*3/uL (ref 150–400)
RBC: 2.43 MIL/uL — AB (ref 3.87–5.11)
RDW: 17.8 % — ABNORMAL HIGH (ref 11.5–15.5)
WBC: 9.3 10*3/uL (ref 4.0–10.5)

## 2016-12-25 MED ORDER — LIDOCAINE HCL (PF) 1 % IJ SOLN
INTRAMUSCULAR | Status: AC
  Filled 2016-12-25: qty 30

## 2016-12-25 MED ORDER — TRAMADOL HCL 50 MG PO TABS
50.0000 mg | ORAL_TABLET | Freq: Two times a day (BID) | ORAL | Status: DC | PRN
  Administered 2016-12-25 – 2017-01-02 (×7): 50 mg via ORAL
  Filled 2016-12-25 (×6): qty 1

## 2016-12-25 MED ORDER — HEPARIN SODIUM (PORCINE) 1000 UNIT/ML IJ SOLN
INTRAMUSCULAR | Status: AC
  Filled 2016-12-25: qty 1

## 2016-12-25 MED ORDER — LIDOCAINE HCL 1 % IJ SOLN
INTRAMUSCULAR | Status: DC | PRN
  Administered 2016-12-25: 5 mL

## 2016-12-25 NOTE — Progress Notes (Signed)
Patient ID: Jill Johnson, female   DOB: [birth year], 76 y.o.   MRN: [id number]  PROGRESS NOTE    Jill Johnson  YBO:[id number] DOB: 09/01/1941 DOA: 12/31/2016  PCP: Marjorie Smolder, MD   Brief Narrative:  76 year old female with past medical history significant for end-stage renal disease on hemodialysis (TTS) started about 2 months ago, CAD status post CABG and AVR (AV replacement), hypothyroidism, hypertension, recent critical limb ischemia. Initially under cardiology primary care and [facility] consulted for evaluation of delirium. Please note patient under [place] primary care starting 12/22/2016.  Patient has had left leg/foot pain for past month or so prior to this admission. She saw Dr. early in clinic and the plan was for outpatient angiography. In short stay, prior to the procedure, patient started to have chest pain and EKG showed new ST depressions. Patient was taken to cath lab on 12/24/2016. She is status post cardiac catheterization with SVG to PDA DES stent placement.  Pt also underwent aortogram with bilateral LE runoff, left popliteal stenting on 12/12/2016.   Barrier to discharge: pt has problems with infiltration. Per renal, it would be optimal to rest the fistula- ideally should have TDC placement but likely not possible in the setting of DAPT requirement. PCT has met with pt who expressed desire to continue HD so plan is for nontunneled cath    Assessment & Plan:   Non-ST elevation myocardial infarction  - SVG to PDA stent placement, DES.  - Dual antiplatelet therapy for at least 6 months per cardiology   - Continue with Brilinta for now, aspirin. Per cario, possibly change to plavix in future  - Aggressive secondary prevention with as noted asa and brilinta as well as atorvastatin, carvedilol  End-stage renal disease with hemodialysis - Left upper arm fistula abut as noted above pt has problems with infiltration.  -  Per renal, it would be optimal to rest the  fistula- ideally should have TDC placement but likely not possible in the setting of DAPT requirement. PCT has met with pt who expressed desire to continue HD so plan is for nontunneled cath   Peripheral vascular disease with critical limb ischemia - Per Dr. Donnetta Hutching pt is very poor surgical candidate - S/P Aortogram with bilateral LE runoff, left popliteal stenting 12/21/2016 by Dr. Sherren Mocha Early  - Pain is particularly worse with HD  Hyperlipidemia - Continue statin therapy   Bioprosthetic aortic valve replacement 2011  - Recent echocardiogram in December 2017 showed normal function  Coronary artery disease status post CABG 2011  - SVG to PDA stenting  Anemia of chronic kidney disease - Hgb 8.6, 8.4, 7.8 - Transfuse for hgb less than 7  Hyponatremia - Sodium 127 --> 129 --> 126 --> 129  Delirium - Better mental status this am  Hypothyroidism  - Continue synthroid   Bilateral LEs and left foot toes pressure injury  - Left medial LE:  3.5 x 2 with depth obscured by the presence of eschar, no exudate - Left lateral LE:  3cm x 1cm x 0.1cm partial thickness with pink, moist base and scant serous exudate - Right LE (anterior):  2.5cm x 1.5cm x 0.2cm full thickness wound with light yellow, moist wound bed and small amount of serous exudate - Dressing procedure/placement/frequency: Conservative wound care using betadine swab sticks to the left toes with eschar in an effort to keep them dry and infection free.  Prevalon Boots are provided bilaterally to protect from injury.  The LE wounds care twice  daily with saline moistened gauze and topped with protective silicone foam.     DVT prophylaxis: Asa, brilinta  Code Status: full code  Family Communication: no family at the bedside this am Disposition Plan: to SNF once determined stable from vascular and nephrology standpoint    Consultants:   Cardiology   Nephrology   Vascular surgery, Dr. Sherren Mocha Early   Palliative care    WOC  Procedures:   ESDR on HD TTS  Aortogram with bilateral LE runoff, left popliteal stenting - 12/29/2016  Antimicrobials:   None    Subjective: No overnight events.   Objective: Vitals:   12/24/16 1344 12/24/16 2053 12/24/16 2127 12/25/16 0520  BP: (!) 144/82 (!) 119/44  (!) 115/36  Pulse:  (!) 56 (!) 59 62  Resp:  16  11  Temp:  99.5 F (37.5 C)  97.7 F (36.5 C)  TempSrc:  Oral  Oral  SpO2: 96% 95%  97%  Weight:    72 kg (158 lb 11.7 oz)  Height:        Intake/Output Summary (Last 24 hours) at 12/25/16 1120 Last data filed at 12/25/16 0600  Gross per 24 hour  Intake              150 ml  Output                0 ml  Net              150 ml   Filed Weights   12/22/16 1105 12/24/16 0706 12/25/16 0520  Weight: 70.1 kg (154 lb 8.7 oz) 70.7 kg (155 lb 13.8 oz) 72 kg (158 lb 11.7 oz)    Examination:  General exam: No acute distress, calm and comfortable  Respiratory system: No wheezing, no rhonchi  Cardiovascular system: S1 & S2 heard, Rate controlled; SEM appreciate 2/6 Gastrointestinal system: (+) BS, no tenderness or distention  Central nervous system: more alert this am Extremities: No tenderness, no swelling  Skin: LLE necrotic wounds on medical aspect, L toes necrotic; LUA AVF Psychiatry: normal mood and behavior   Data Reviewed: I have personally reviewed following labs and imaging studies  CBC:  Recent Labs Lab 12/20/16 0205 12/22/16 0739 01/06/2017 0530 12/24/16 0700 12/25/16 0305  WBC 14.5* 12.0* 10.4 10.0 9.3  HGB 8.4* 8.6* 8.4* 8.1* 7.8*  HCT 25.5* 25.2* 25.3* 24.5* 23.8*  MCV 94.8 95.1 95.8 97.2 97.9  PLT 262 283 302 293 979   Basic Metabolic Panel:  Recent Labs Lab 12/19/16 0822 12/22/16 0739 12/12/2016 0530 12/24/16 0700 12/25/16 0305  NA 135 127* 129* 126* 129*  K 3.9 4.1 3.7 4.0 3.5  CL 101 92* 93* 91* 92*  CO2 '22 23 25 24 28  '$ GLUCOSE 72 104* 83 79 96  BUN 33* 46* 26* 36* 24*  CREATININE 4.11* 5.29* 3.40* 4.37* 3.89*   CALCIUM 7.9* 8.0* 7.7* 7.8* 7.4*  PHOS  --  4.7*  --  5.4*  --    GFR: Estimated Creatinine Clearance: 10.7 mL/min (by C-G formula based on SCr of 3.89 mg/dL (H)). Liver Function Tests:  Recent Labs Lab 12/19/16 0229 12/22/16 0739 12/24/16 0700  AST 37  --   --   ALT 17  --   --   ALKPHOS 59  --   --   BILITOT 0.6  --   --   PROT 5.4*  --   --   ALBUMIN 2.4* 2.3* 2.1*   No results for input(s): LIPASE, AMYLASE  in the last 168 hours. No results for input(s): AMMONIA in the last 168 hours. Coagulation Profile:  Recent Labs Lab 12/17/2016 1547 12/26/2016 0530  INR 1.18 1.17   Cardiac Enzymes:  Recent Labs Lab 12/22/2016 2108 12/19/16 0229 12/19/16 0822  TROPONINI 0.96* 2.11* 2.28*   BNP (last 3 results) No results for input(s): PROBNP in the last 8760 hours. HbA1C: No results for input(s): HGBA1C in the last 72 hours. CBG:  Recent Labs Lab 12/20/16 1943  GLUCAP 138*   Lipid Profile: No results for input(s): CHOL, HDL, LDLCALC, TRIG, CHOLHDL, LDLDIRECT in the last 72 hours. Thyroid Function Tests: No results for input(s): TSH, T4TOTAL, FREET4, T3FREE, THYROIDAB in the last 72 hours. Anemia Panel: No results for input(s): VITAMINB12, FOLATE, FERRITIN, TIBC, IRON, RETICCTPCT in the last 72 hours. Urine analysis:    Component Value Date/Time   COLORURINE YELLOW 10/28/2016 1600   APPEARANCEUR CLEAR 10/28/2016 1600   LABSPEC 1.011 10/28/2016 1600   PHURINE 6.5 10/28/2016 1600   GLUCOSEU NEGATIVE 10/28/2016 1600   HGBUR NEGATIVE 10/28/2016 1600   BILIRUBINUR NEGATIVE 10/28/2016 1600   KETONESUR NEGATIVE 10/28/2016 1600   PROTEINUR 30 (A) 10/28/2016 1600   UROBILINOGEN 1.0 01/30/2010 1104   NITRITE NEGATIVE 10/28/2016 1600   LEUKOCYTESUR NEGATIVE 10/28/2016 1600   Sepsis Labs: '@LABRCNTIP'$ (procalcitonin:4,lacticidven:4)    Recent Results (from the past 240 hour(s))  MRSA PCR Screening     Status: None   Collection Time: 12/27/2016  8:18 PM  Result Value Ref  Range Status   MRSA by PCR NEGATIVE NEGATIVE Final      Radiology Studies: Dg Chest Port 1 View Result Date: 12/19/2016 Cardiomegaly with central vascular congestion. Possible small left effusion. No overt pulmonary edema.    Scheduled Meds: . acetaminophen  1,000 mg Oral TID  . aspirin  81 mg Oral Daily  . atorvastatin  80 mg Oral q1800  . carvedilol  3.125 mg Oral BID WC  .  ceFAZolin IV  1 g Intravenous To OR  . cholecalciferol  2,000 Units Oral Daily  . gabapentin  200 mg Oral BID  . mouth rinse  15 mL Mouth Rinse BID  . sucroferric oxyhydroxide  500 mg Oral TID WC  . ticagrelor  90 mg Oral BID  . vitamin B-12  1,500 mcg Oral Daily   Continuous Infusions: . sodium chloride 10 mL/hr at 12/15/2016 1235     LOS: 7 days    Time spent: 25 minutes  Greater than 50% of the time spent on counseling and coordinating the care.   Leisa Lenz, MD [facility] Pager [phone]  If 7PM-7AM, please contact night-coverage [email or web address] Password [email or web address] 12/25/2016, 11:20 AM

## 2016-12-25 NOTE — Progress Notes (Signed)
Pt is assisted back to bed using a stedy. Pt tolerated this transfer well with no complaint. Dressing change done. Pt refused to have the prevalon boots on at this time; education provided.

## 2016-12-25 NOTE — Consult Note (Signed)
   [facility]   12/25/2016  Dorthula Perfectancy L Stanish Aug 31, 1941 [id number]   Patient assess for 2 admissions in the past 6 months.  Chart reviewed for this 76 year old female with past medical history significant for end-stage renal disease on hemodialysis (TTS) started about 2 months ago, CAD status post CABG and AVR (AV replacement), hypothyroidism, hypertension, recent critical limb ischemia per MD notes.  The notes also indicate the patient has had palliative care meeting on goals of care.  Current disposition is for skilled nursing facility with palliative care.  No [facility] needs noted for post hospital follow up. For questions, please contact:  Charlesetta ShanksVictoria Lakenya Riendeau, RN BSN CCM [facility] Liaison  [phone] business mobile phone Toll free office [phone]

## 2016-12-25 NOTE — Progress Notes (Addendum)
  [facility] Progress Note  Subjective    Left foot feels better. Still having pain with left hip. Asking about when she can go home.  Objective Vitals:   12/24/16 2127 12/25/16 0520  BP:  (!) 115/36  Pulse: (!) 59 62  Resp:  11  Temp:  97.7 F (36.5 C)    Intake/Output Summary (Last 24 hours) at 12/25/16 [id number] Last data filed at 12/25/16 0600  Gross per 24 hour  Intake              150 ml  Output              880 ml  Net             -730 ml   Right groin is soft with some resolving ecchymosis Left foot with dry gangrene left 2nd and 4th toes. Clean dressings bilateral lower extremities.   Assessment/Planning: 76 y.o. female is s/p:  Aortogram and left leg runoff, crossing the popliteal artery occlusion and angioplasty and Viaban stenting of left popliteal artery 2 Days Post-Op   Left toes continuing to demarcate. Hopefully increased blood flow is enough for left foot salvage. No further surgical plans at this point.  Left foot pain is better controlled today.  Palliative saw patient yesterday but was confused and no family was present.  Stable from vascular standpoint for SNF.    Jill Johnson    Component Value Date/Time   WBC 9.3 12/25/2016 0305   HGB 7.8 (L) 12/25/2016 0305   HCT 23.8 (L) 12/25/2016 0305   PLT 294 12/25/2016 0305    BMET    Component Value Date/Time   NA 129 (L) 12/25/2016 0305   K 3.5 12/25/2016 0305   CL 92 (L) 12/25/2016 0305   CO2 28 12/25/2016 0305   GLUCOSE 96 12/25/2016 0305   BUN 24 (H) 12/25/2016 0305   CREATININE 3.89 (H) 12/25/2016 0305   CALCIUM 7.4 (L) 12/25/2016 0305   CALCIUM 8.4 (L) 10/20/2016 0929   GFRNONAA 10 (L) 12/25/2016 0305   GFRAA 12 (L) 12/25/2016 0305    COAG Lab Results  Component Value Date   INR 1.17 12/24/2016   INR 1.18 12/25/2016   INR 1.35 10/28/2016   No results found for: PTT  Antibiotics Anti-infectives    Start      Dose/Rate Route Frequency Ordered Stop   01/04/2017 1215  ceFAZolin (ANCEF) 2-4 GM/100ML-% IVPB    Comments:  Scronce, Trina   : cabinet override      12/12/2016 1215 12/24/16 0029   12/12/2016 0600  ceFAZolin (ANCEF) IVPB 1 g/50 mL premix    Comments:  Send with pt to OR   1 g 100 mL/hr over 30 Minutes Intravenous To Surgery 12/22/16 0815 12/24/16 0600       Jill BergerKimberly Johnson, Jill Johnson [facility] Office: [phone] Pager: [phone] 12/25/2016 9:42 AM   I have examined the patient, reviewed and agree with above.Foot pain better.  Will see again Monday.  Please call over the weekend for vascular issues  [name], [name], MD 12/25/2016 3:07 PM

## 2016-12-25 NOTE — Progress Notes (Signed)
Progress Note  Patient Name: Jill Johnson Date of Encounter: 12/25/2016  Primary Cardiologist: Anne FuSkains  Subjective   Foot feeling a lot better. No CP or SOB  Inpatient Medications    Scheduled Meds: . acetaminophen  1,000 mg Oral TID  . aspirin  81 mg Oral Daily  . atorvastatin  80 mg Oral q1800  . carvedilol  3.125 mg Oral BID WC  . cholecalciferol  2,000 Units Oral Daily  . darbepoetin (ARANESP) injection - DIALYSIS  100 mcg Intravenous Q Thu-HD  . feeding supplement (PRO-STAT SUGAR FREE 64)  30 mL Oral BID  . gabapentin  200 mg Oral BID  . heparin  5,000 Units Subcutaneous Q8H  . levothyroxine  112 mcg Oral QAC breakfast  . mouth rinse  15 mL Mouth Rinse BID  . mirtazapine  7.5 mg Oral QHS  . senna  1 tablet Oral QHS  . sodium chloride flush  3 mL Intravenous Q12H  . sucroferric oxyhydroxide  500 mg Oral TID WC  . ticagrelor  90 mg Oral BID  . vitamin B-12  1,500 mcg Oral Daily   Continuous Infusions: . sodium chloride 10 mL/hr at 12/24/2016 1235   PRN Meds: sodium chloride, acetaminophen, ALPRAZolam, dextromethorphan-guaiFENesin, nitroGLYCERIN, ondansetron (ZOFRAN) IV, senna-docusate, sodium chloride flush, traMADol   Vital Signs    Vitals:   12/24/16 1344 12/24/16 2053 12/24/16 2127 12/25/16 0520  BP: (!) 144/82 (!) 119/44  (!) 115/36  Pulse:  (!) 56 (!) 59 62  Resp:  16  11  Temp:  99.5 F (37.5 C)  97.7 F (36.5 C)  TempSrc:  Oral  Oral  SpO2: 96% 95%  97%  Weight:    158 lb 11.7 oz (72 kg)  Height:        Intake/Output Summary (Last 24 hours) at 12/25/16 1006 Last data filed at 12/25/16 0600  Gross per 24 hour  Intake              150 ml  Output                0 ml  Net              150 ml   Filed Weights   12/22/16 1105 12/24/16 0706 12/25/16 0520  Weight: 154 lb 8.7 oz (70.1 kg) 155 lb 13.8 oz (70.7 kg) 158 lb 11.7 oz (72 kg)    Telemetry    NSR, no VT - Personally Reviewed  ECG    ST depression lateral improved - Personally  Reviewed  Physical Exam   Affect appropriate Chronically ill white female  HEENT: normal Neck supple with no adenopathy JVP normal no bruits no thyromegaly Lungs clear with no wheezing and good diaphragmatic motion Heart:  S1/S2 SEM murmur, no rub, gallop or click PMI normal LUE fistual  Abdomen: benighn, BS positve, no tenderness, no AAA no bruit.  No HSM or HJR Unable to palpate distal pulses with dressings on both shins  No edema Neuro non-focal Skin warm and dry RFA angiogram sight ok     Labs    Chemistry  Recent Labs Lab 12/19/16 0229  12/22/16 0739 12/19/2016 0530 12/24/16 0700 12/25/16 0305  NA 137  < > 127* 129* 126* 129*  K 3.7  < > 4.1 3.7 4.0 3.5  CL 98*  < > 92* 93* 91* 92*  CO2 26  < > 23 25 24 28   GLUCOSE 73  < > 104* 83 79 96  BUN  30*  < > 46* 26* 36* 24*  CREATININE 3.76*  < > 5.29* 3.40* 4.37* 3.89*  CALCIUM 8.1*  < > 8.0* 7.7* 7.8* 7.4*  PROT 5.4*  --   --   --   --   --   ALBUMIN 2.4*  --  2.3*  --  2.1*  --   AST 37  --   --   --   --   --   ALT 17  --   --   --   --   --   ALKPHOS 59  --   --   --   --   --   BILITOT 0.6  --   --   --   --   --   GFRNONAA 11*  < > 7* 12* 9* 10*  GFRAA 12*  < > 8* 14* 10* 12*  ANIONGAP 13  < > 12 11 11 9   < > = values in this interval not displayed.   Hematology  Recent Labs Lab 01/04/2017 0530 12/24/16 0700 12/25/16 0305  WBC 10.4 10.0 9.3  RBC 2.64* 2.52* 2.43*  HGB 8.4* 8.1* 7.8*  HCT 25.3* 24.5* 23.8*  MCV 95.8 97.2 97.9  MCH 31.8 32.1 32.1  MCHC 33.2 33.1 32.8  RDW 17.6* 17.8* 17.8*  PLT 302 293 294    Cardiac Enzymes  Recent Labs Lab 12/10/2016 2108 12/19/16 0229 12/19/16 0822  TROPONINI 0.96* 2.11* 2.28*   No results for input(s): TROPIPOC in the last 168 hours.   BNPNo results for input(s): BNP, PROBNP in the last 168 hours.   DDimer No results for input(s): DDIMER in the last 168 hours.   Radiology    No results found.  Cardiac Studies   Cardiac catheterization  01/03/2017 Conclusion   1. Severe 2 vessel CAD with total occlusion of the LAD and total occlusion of the RCA 2. S/P CABG with patency of the SVG-diag, critical stenosis of the SVG-PDA, and non-visualization of the LIMA-LAD 3. Successful PCI of the SVG-PDA with a DES platform  Recommend:  Load with Brilinta 180 mg when patient able to swallow medicine  Continue aggrastat at reduced dose until patient takes brilinta  DAPT with ASA/brilinta as tolerated at least 6 months. If she needs to switch to a less-potent thienopyridine for vascular surgery she could be switched to plavix.    Echocardiogram 10/31/16 - Left ventricle: The cavity size was normal. Wall thickness was   increased in a pattern of moderate LVH. Systolic function was   normal. The estimated ejection fraction was in the range of 60%   to 65%. Wall motion was normal; there were no regional wall   motion abnormalities. Features are consistent with a pseudonormal   left ventricular filling pattern, with concomitant abnormal   relaxation and increased filling pressure (grade 2 diastolic   dysfunction). - Aortic valve: Bioprosthesis in aortic position. There was trivial   regurgitation. Mean gradient (S): 29 mm Hg. Peak gradient (S): 51   mm Hg. VTI ratio of LVOT to aortic valve: 0.4. - Mitral valve: Calcified annulus. Mildly thickened leaflets .   There was mild to moderate regurgitation. - Left atrium: The atrium was severely dilated. - Tricuspid valve: There was trivial regurgitation. - Pulmonary arteries: Systolic pressure could not be accurately   estimated. - Pericardium, extracardiac: There was no pericardial effusion.  Impressions:  - Moderate LVH with LVEF 60-65% and grade 2 diastolic dysfunction.   Severe left atrial enlargement. Moderately  calcified mitral   annulus with thickened leaflets and mild to moderate mitral   regurgitation. 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   Trivial aortic regurgitation  noted. Gradients have increased   compared to the prior study with mean gradient 29 mmHg, although   the dimensionless index does not suggest prosthetic valve   stenosis. Trivial tricuspid regurgitation, unable to assess PASP.  Patient Profile     76 y.o. female here with non-ST elevation myocardial infarction status post cardiac catheterization with SVG to PDA DES stent placement, post CABG 2011 with bioprosthetic aortic valve replacement, end-stage renal disease on hemodialysis, severe peripheral vascular disease with critical limb ischemia of left foot with ulcerated wounds, status post left upper arm AV fistula followed by Dr. Arbie Cookey who was admitted December 25, 2016 for chest pain.   Assessment & Plan    Non-ST elevation myocardial infarction  - SVG to PDA stent placement, DES on 2016-12-25  - Dual antiplatelet therapy for at least 6 months  - Continue with Brilinta for now, can switch to Plavix if necessary in the future. Please see catheterization note.  - Aggressive secondary prevention, aspirin, Brilinta, atorvastatin, carvedilol  - Follow up arranged in 2 weeks.  End-stage renal disease with hemodialysis  - Left upper arm fistula  - Continue dialysis    Peripheral vascular disease with critical limb ischemia  - post stenting of left popliteal to improve inflow with good peroneal runoff. Left toes continuing to demarcate. Hopefully increased blood flow is enough for left foot salvage. No further surgical plans at this point.  Left foot pain is better controlled today. Palliative saw patient yesterday but patient was confused and no family was present. Okay for discharged from V[facility] standpoint and cardiology. I have arranged for follow up in our office.  Hyperlipidemia  - Atorvastatin 80   Bioprosthetic aortic valve replacement 2011  - Antibiotic prophylaxis if necessary  - Recent echocardiogram in December 2017 showed normal function  Coronary artery disease status post CABG 2011  - See  cardiac catheterization report as above.  - SVG to PDA stenting. DAT   Delirium  - TRH consult. Confusion. Anxiety  Hypotension  -Holding carvedilol, losartan 50 mg and amlodipine for now  Anemia  Per VVS consider transfusion to improve oxygenation to foot: Hg down to 7.8  Dispo: plan is for discharge to a SNF over the next couple days. I have arranged cardiology follow up   Signed, Cline Crock, PA-C  12/25/2016, 10:06 AM    Still with left foot pain during dialysis. Post right popliteal stenting to improve inflow Cardiac status stable No chest pain on DAT SEM on exam from bioprosthetic AV  Will sign off   Charlton Haws

## 2016-12-25 NOTE — Progress Notes (Signed)
[facility] Progress Note   Subjective: HD treatment terminated early yesterday secondary to severe unremitting LLE pain  Vitals:   12/24/16 1344 12/24/16 2053 12/24/16 2127 12/25/16 0520  BP: (!) 144/82 (!) 119/44  (!) 115/36  Pulse:  (!) 56 (!) 59 62  Resp:  16  11  Temp:  99.5 F (37.5 C)  97.7 F (36.5 C)  TempSrc:  Oral  Oral  SpO2: 96% 95%  97%  Weight:    72 kg (158 lb 11.7 oz)  Height:        Inpatient medications: . acetaminophen  1,000 mg Oral TID  . aspirin  81 mg Oral Daily  . atorvastatin  80 mg Oral q1800  . carvedilol  3.125 mg Oral BID WC  . cholecalciferol  2,000 Units Oral Daily  . darbepoetin (ARANESP) injection - DIALYSIS  100 mcg Intravenous Q Thu-HD  . feeding supplement (PRO-STAT SUGAR FREE 64)  30 mL Oral BID  . gabapentin  200 mg Oral BID  . heparin  5,000 Units Subcutaneous Q8H  . levothyroxine  112 mcg Oral QAC breakfast  . mouth rinse  15 mL Mouth Rinse BID  . mirtazapine  7.5 mg Oral QHS  . senna  1 tablet Oral QHS  . sodium chloride flush  3 mL Intravenous Q12H  . sucroferric oxyhydroxide  500 mg Oral TID WC  . ticagrelor  90 mg Oral BID  . vitamin B-12  1,500 mcg Oral Daily   . sodium chloride 10 mL/hr at 12/16/2016 1235   sodium chloride, acetaminophen, ALPRAZolam, dextromethorphan-guaiFENesin, nitroGLYCERIN, ondansetron (ZOFRAN) IV, senna-docusate, sodium chloride flush, traMADol  Exam: Gen sleeping NAD NECK No jvd or bruits PULM CTAB no c/w/r CV RRR III/VI harsh systolic murmur RUSB Abd soft ntnd no mass or ascites +bs      Ext L lower extremity with necrotic wounds on medial aspect, dressed today. L foot warmer than prior, L toes remain necrotic Neuro: drifts off to sleep mid-conversation LUA AVF, + bruit, large infiltration   Dialysis: TTS NW 4h  67kg   2/2.25 bath  Hep 2000  LUA AVF   16ga needles (does not want 15 ga)  - darbe 200/ wk last 2/8  - venofer 100 mg/ hd, one dose left  - last Hb 8, Ca 9, P 5.8,  pth 251  - tums 1 ac, cozaar 50 hs  Assessment: 1.  Acute MI - sp PCI to SVG-PDA 2/9. [place]; on DAPT 2.  ESRD on HD TTS - started HD 6 wks ago.  HD today.  Problems with infiltration.  It would be optimal to rest the fistula- ideally should have TDC placement but likely not possible in the setting of DAPT requirement.  Could do nontunneled cath for a couple of treatments.  Will need to discuss this with the pt and her sons when she is more awake and alert.   She is having a hard time coping with dialysis.  Her LLE pain is expectedly worse during dialysis and if there are no options to mitigate critical limb ischemia it may be appropriate to discuss shifting focus of care to symptom management/ comfort rather than aggressive care.  Appreciate palliative care involvement.  Her pain is unremitting in dialysis and another family meeting is in order.  Appreciate Dr. Onnie BoerFreeman's and palliative care team involvement 3.  PAD / L leg ulcers - per VVS, sig disease LLE; s/p L popliteal stenting 4.  HTN/ vol - does not  appear overloaded.  EDWs being gotten in the bed. 5.  Hx CABG/ bio-AVR 2011 6.  Anemia of CKD - Hb in the mid 8s.  Aranesp 100 q Thursday. Completed an iron load as an outpatient.   7.  MBD - Phoslo 2 capsules AC before meals- in setting of bad atherosclerosis, changing to velphoro 1 AC TID; following Phos 8.  Nutrition: albumin 2.3, prostat    Jill Buttner MD [facility] pgr [phone] 12/25/2016, 8:37 AM    Recent Labs Lab 12/22/16 0739 2017-01-14 0530 12/24/16 0700 12/25/16 0305  NA 127* 129* 126* 129*  K 4.1 3.7 4.0 3.5  CL 92* 93* 91* 92*  CO2 23 25 24 28   GLUCOSE 104* 83 79 96  BUN 46* 26* 36* 24*  CREATININE 5.29* 3.40* 4.37* 3.89*  CALCIUM 8.0* 7.7* 7.8* 7.4*  PHOS 4.7*  --  5.4*  --     Recent Labs Lab 12/19/16 0229 12/22/16 0739 12/24/16 0700  AST 37  --   --   ALT 17  --   --   ALKPHOS 59  --   --   BILITOT 0.6  --   --   PROT  5.4*  --   --   ALBUMIN 2.4* 2.3* 2.1*    Recent Labs Lab January 14, 2017 0530 12/24/16 0700 12/25/16 0305  WBC 10.4 10.0 9.3  HGB 8.4* 8.1* 7.8*  HCT 25.3* 24.5* 23.8*  MCV 95.8 97.2 97.9  PLT 302 293 294   Iron/TIBC/Ferritin/ %Sat    Component Value Date/Time   IRON 49 10/20/2016 0929   TIBC 354 10/20/2016 0929   FERRITIN 387 (H) 10/20/2016 0929   IRONPCTSAT 14 10/20/2016 0929

## 2016-12-25 NOTE — Progress Notes (Signed)
Palliative progress note  Reason for visit: Goals of care in light of recent NSTEMI, end-stage renal disease with poor toleration of dialysis  I met today with Jill Johnson after talking over the case with Dr. Hollie Salk this morning.  We discussed what she describes as a "horrible" day and dialysis session yesterday.  When asked to elaborate on this, she reports that she had pain that she thinks is related to people "not taking their time" when moving her about ("they kept hitting my leg"), establishing access ("they just kept sticking me over and over"), and changing her pain medication from her preferred tramadol to oxycodone ("I told everyone I don't like oxycodone").    She also relays that she has been having a very difficult day because one of her close friends, whom she has known for over 15 years and was also a dialysis patient, died yesterday.    Attempted to discuss with her concern that she had so much pain with dialysis yesterday and there is a good likelihood this will continue be the case moving forward. I started to discuss with her that the goal of dialysis is to add quality time to her life.  She then stopped me and stated that, "You're going to say it's not true, but I think that doctors are just wanting to get rid of older people."  I attempted to explain to her that my goal is to make sure that we have a clear objective with any medical care provided, and I want to ensure that what is most important to her lines up with what we are trying to get from continued medical therapies.  She states that while she does not want another catheter, she is agreeable to this if it is needed to continue with dialysis. She is not interested in discussing the burden and benefit of continuing versus stopping dialysis.  She reports that she will be able to continue dialysis as long as she receives Tylenol, tramadol, and medication for anxiety prior to her dialysis sessions. She was very clear that she does  not want to consider a trial of any other pain medication besides tramadol.  I called and discussed again with Dr. Hollie Salk who states she will arrange for catheter placement.  Total time: 50 minutes Greater than 50%  of this time was spent counseling and coordinating care related to the above assessment and plan.  Jill Rough, MD [facility] [phone]

## 2016-12-25 NOTE — Procedures (Signed)
RIJV HD catheter SVC RA No comp/EBL 

## 2016-12-26 LAB — CBC
HCT: 24.6 % — ABNORMAL LOW (ref 36.0–46.0)
Hemoglobin: 8 g/dL — ABNORMAL LOW (ref 12.0–15.0)
MCH: 32.1 pg (ref 26.0–34.0)
MCHC: 32.5 g/dL (ref 30.0–36.0)
MCV: 98.8 fL (ref 78.0–100.0)
PLATELETS: 289 10*3/uL (ref 150–400)
RBC: 2.49 MIL/uL — ABNORMAL LOW (ref 3.87–5.11)
RDW: 18 % — AB (ref 11.5–15.5)
WBC: 7.8 10*3/uL (ref 4.0–10.5)

## 2016-12-26 LAB — BASIC METABOLIC PANEL
Anion gap: 10 (ref 5–15)
BUN: 40 mg/dL — AB (ref 6–20)
CALCIUM: 7.8 mg/dL — AB (ref 8.9–10.3)
CO2: 27 mmol/L (ref 22–32)
CREATININE: 4.63 mg/dL — AB (ref 0.44–1.00)
Chloride: 91 mmol/L — ABNORMAL LOW (ref 101–111)
GFR calc Af Amer: 10 mL/min — ABNORMAL LOW (ref >60)
GFR, EST NON AFRICAN AMERICAN: 8 mL/min — AB (ref >60)
Glucose, Bld: 95 mg/dL (ref 65–99)
POTASSIUM: 3.8 mmol/L (ref 3.5–5.1)
SODIUM: 128 mmol/L — AB (ref 135–145)

## 2016-12-26 MED ORDER — CALCIUM CARBONATE ANTACID 500 MG PO CHEW
1.0000 | CHEWABLE_TABLET | Freq: Once | ORAL | Status: AC
  Administered 2016-12-26: 200 mg via ORAL
  Filled 2016-12-26: qty 1

## 2016-12-26 MED ORDER — BISACODYL 10 MG RE SUPP
10.0000 mg | Freq: Once | RECTAL | Status: DC
Start: 2016-12-26 — End: 2016-12-29

## 2016-12-26 MED ORDER — GI COCKTAIL ~~LOC~~
30.0000 mL | ORAL | Status: DC | PRN
  Filled 2016-12-26: qty 30

## 2016-12-26 NOTE — Progress Notes (Addendum)
During bedside report patient complaining of needing to have a bowel movement but unable to do so.  Will page MD for medication.    Patient refused dulcolax suppository stating she had a bowel movement.

## 2016-12-26 NOTE — Progress Notes (Signed)
[facility] Progress Note   Subjective:  "I'm trying to have a bowel movement but I'm impacted and it hurts!" Denies chest pain/SOB. LLE exquisitely tender. Low fowlers position. No WOB.   Objective Vitals:   12/25/16 2038 12/25/16 2056 12/26/16 0125 12/26/16 0437  BP:  (!) 138/45 (!) 137/51 (!) 141/41  Pulse: 64   (!) 59  Resp: 18 19  18   Temp: 98.8 F (37.1 C)   99 F (37.2 C)  TempSrc: Oral   Axillary  SpO2: 94% 95% 95% 94%  Weight:    71.6 kg (157 lb 12.8 oz)  Height:       Physical Exam General: Chronically ill appearing female in NAD Heart: 3/6 harsh systolic M radiating across precordium.  Lungs: CTAB. No WOB.  Abdomen: soft, non-tender  Extremities: Pink foam drsgs LLE-necrotic appearing wounds L shin and toes.  ecchymosis R groin and down inside of R thigh. No edema.  Dialysis Access: LUA AVF + bruit RIJ temp cath drsg intact   Additional Objective Labs: Basic Metabolic Panel:  Recent Labs Lab 12/22/16 0739  12/24/16 0700 12/25/16 0305 12/26/16 0510  NA 127*  < > 126* 129* 128*  K 4.1  < > 4.0 3.5 3.8  CL 92*  < > 91* 92* 91*  CO2 23  < > 24 28 27   GLUCOSE 104*  < > 79 96 95  BUN 46*  < > 36* 24* 40*  CREATININE 5.29*  < > 4.37* 3.89* 4.63*  CALCIUM 8.0*  < > 7.8* 7.4* 7.8*  PHOS 4.7*  --  5.4*  --   --   < > = values in this interval not displayed. Liver Function Tests:  Recent Labs Lab 12/22/16 0739 12/24/16 0700  ALBUMIN 2.3* 2.1*   No results for input(s): LIPASE, AMYLASE in the last 168 hours. CBC:  Recent Labs Lab 12/22/16 0739 01/03/2017 0530 12/24/16 0700 12/25/16 0305 12/26/16 0510  WBC 12.0* 10.4 10.0 9.3 7.8  HGB 8.6* 8.4* 8.1* 7.8* 8.0*  HCT 25.2* 25.3* 24.5* 23.8* 24.6*  MCV 95.1 95.8 97.2 97.9 98.8  PLT 283 302 293 294 289   Blood Culture    Component Value Date/Time   SDES URINE, CATHETERIZED 10/28/2016 1600   SPECREQUEST NONE 10/28/2016 1600   CULT NO GROWTH Performed at [facility]  10/28/2016 1600   REPTSTATUS 10/29/2016 FINAL 10/28/2016 1600    Cardiac Enzymes: No results for input(s): CKTOTAL, CKMB, CKMBINDEX, TROPONINI in the last 168 hours. CBG:  Recent Labs Lab 12/20/16 1943  GLUCAP 138*   Iron Studies: No results for input(s): IRON, TIBC, TRANSFERRIN, FERRITIN in the last 72 hours. @lablastinr3 @ Studies/Results: Ir Fluoro Guide Cv Line Right  Result Date: 12/25/2016 INDICATION: Renal failure EXAM: IR RIGHT FLOURO GUIDE CV LINE; IR ULTRASOUND GUIDANCE VASC ACCESS RIGHT MEDICATIONS: None. ANESTHESIA/SEDATION: None. FLUOROSCOPY TIME:  Fluoroscopy Time: 4 minutes 30 seconds (26 mGy). COMPLICATIONS: None immediate. PROCEDURE: Informed written consent was obtained from the patient after a thorough discussion of the procedural risks, benefits and alternatives. All questions were addressed. Maximal Sterile Barrier Technique was utilized including caps, mask, sterile gowns, sterile gloves, sterile drape, hand hygiene and skin antiseptic. A timeout was performed prior to the initiation of the procedure. The right neck was prepped and draped in a sterile fashion. 1% lidocaine was utilized for local anesthesia. Under sonographic guidance, a micropuncture needle was inserted into the right jugular vein and up sized to an Amplatz wire. The dilator followed by the  temporary dialysis catheter were advanced over the wire to the cavoatrial junction. It was flushed and sewn in place with an 0 Prolene knot. FINDINGS: A temporary right jugular dialysis catheter has been placed with its tip at the cavoatrial junction. IMPRESSION: Successful temporary right jugular dialysis catheter placement with its tip at the cavoatrial junction. Electronically Signed   By: Jolaine ClickArthur  Hoss M.D.   On: 12/25/2016 16:40   Ir [place] Guide Vasc Access Right  Result Date: 12/25/2016 INDICATION: Renal failure EXAM: IR RIGHT FLOURO GUIDE CV LINE; IR ULTRASOUND GUIDANCE VASC ACCESS RIGHT MEDICATIONS: None.  ANESTHESIA/SEDATION: None. FLUOROSCOPY TIME:  Fluoroscopy Time: 4 minutes 30 seconds (26 mGy). COMPLICATIONS: None immediate. PROCEDURE: Informed written consent was obtained from the patient after a thorough discussion of the procedural risks, benefits and alternatives. All questions were addressed. Maximal Sterile Barrier Technique was utilized including caps, mask, sterile gowns, sterile gloves, sterile drape, hand hygiene and skin antiseptic. A timeout was performed prior to the initiation of the procedure. The right neck was prepped and draped in a sterile fashion. 1% lidocaine was utilized for local anesthesia. Under sonographic guidance, a micropuncture needle was inserted into the right jugular vein and up sized to an Amplatz wire. The dilator followed by the temporary dialysis catheter were advanced over the wire to the cavoatrial junction. It was flushed and sewn in place with an 0 Prolene knot. FINDINGS: A temporary right jugular dialysis catheter has been placed with its tip at the cavoatrial junction. IMPRESSION: Successful temporary right jugular dialysis catheter placement with its tip at the cavoatrial junction. Electronically Signed   By: Jolaine ClickArthur  Hoss M.D.   On: 12/25/2016 16:40   Medications: . sodium chloride 10 mL/hr at 12/11/2016 1235   . acetaminophen  1,000 mg Oral TID  . aspirin  81 mg Oral Daily  . atorvastatin  80 mg Oral q1800  . carvedilol  3.125 mg Oral BID WC  . cholecalciferol  2,000 Units Oral Daily  . darbepoetin (ARANESP) injection - DIALYSIS  100 mcg Intravenous Q Thu-HD  . feeding supplement (PRO-STAT SUGAR FREE 64)  30 mL Oral BID  . gabapentin  200 mg Oral BID  . heparin  5,000 Units Subcutaneous Q8H  . levothyroxine  112 mcg Oral QAC breakfast  . mirtazapine  7.5 mg Oral QHS  . senna  1 tablet Oral QHS  . sodium chloride flush  3 mL Intravenous Q12H  . sucroferric oxyhydroxide  500 mg Oral TID WC  . ticagrelor  90 mg Oral BID  . vitamin B-12  1,500 mcg Oral  Daily     Dialysis:TTS NW 4h 67kg 2/2.25 bath Hep 2000 LUA AVF 16ga needles (does not want 15 ga) - darbe 200/ wk last 2/8 - venofer 100 mg/ hd, one dose left - last Hb 8, Ca 9, P 5.8, pth 251 - tums 1 ac, cozaar 50 hs  Assessment: 1. Acute MI - sp PCI to SVG-PDA 2/9. Started on [place]il[place]; on DAPT 2. ESRD on HD TTS - started HD 6 wks ago.  HD today.  Problems with infiltration.  It would be optimal to rest the fistula- Had RIJ temp cath placed 12/25/16 per IR. Appreciate help from Dr. Bonnielee HaffHoss.  She is having a hard time coping with dialysis.  Her LLE pain is expectedly worse during dialysis and if there are no options to mitigate critical limb ischemia it may be appropriate to discuss shifting focus of care to symptom management/ comfort rather than aggressive care.  Appreciate palliative care involvement.  Her pain is unremitting in dialysis and another family meeting is in order.  Appreciate Dr. Onnie Boer and palliative care team involvement. HD ordered for today via Temp cath.  3.  PAD / L leg ulcers - per VVS, sig disease LLE; s/p L popliteal stenting 4. HTN/ vol - does not appear overloaded.  EDWs being gotten in the bed. Wt today 71.6 kg. Still above OP EDW. UFG 1.5-2 liters ordered. Hopefully can stay on HD for full treatment but has issues with pain toward end of tx.  5. Hx CABG/ bio-AVR 2011 6. Anemia of CKD - Hb in the mid 8s.  Aranesp 100 q Thursday. Completed an iron load as an outpatient.   7. MBD - Phoslo 2 capsules AC before meals- in setting of bad atherosclerosis, changing to velphoro 1 AC TID; following Phos 8.  Nutrition: albumin 2.3, prostat  9. Constipation: Has impaction. Per primary.     Rosalina Dingwall H. Gorden Stthomas NP-C 12/26/2016, 8:25 AM  [employer] [phone]

## 2016-12-26 NOTE — Progress Notes (Addendum)
Patient ID: Jill Johnson, female   DOB: [birth year], 76 y.o.   MRN: [id number]  PROGRESS NOTE    Jill Johnson  YSA:[id number] DOB: 11-27-1940 DOA: 12/16/2016  PCP: Marjorie Smolder, MD   Brief Narrative:  76 year old female with past medical history significant for end-stage renal disease on hemodialysis (TTS) started about 2 months ago, CAD status post CABG and AVR (AV replacement), hypothyroidism, hypertension, recent critical limb ischemia. Initially under cardiology primary care and [facility] consulted for evaluation of delirium. Please note patient under [facility] primary care starting 12/22/2016.  Patient has had left leg/foot pain for past month or so prior to this admission. She saw Dr. early in clinic and the plan was for outpatient angiography. In short stay, prior to the procedure, patient started to have chest pain and EKG showed new ST depressions. Patient was taken to cath lab on 12/16/2016. She is status post cardiac catheterization with SVG to PDA DES stent placement.  Pt also underwent aortogram with bilateral LE runoff, left popliteal stenting on 01/01/2017.   Barrier to discharge: pt has problems with infiltration. Per renal, it would be optimal to rest the fistula- ideally should have TDC placement but likely not possible in the setting of DAPT requirement. PCT has met with pt who expressed desire to continue HD. RIJV HD catheter placed 2/16.   Assessment & Plan:   Non-ST elevation myocardial infarction  - SVG to PDA stent placement, DES.  - Dual antiplatelet therapy for at least 6 months per cardiology   - Continue with Brilinta for now, aspirin. Per cario, possibly change to plavix in future  - Aggressive secondary prevention with as noted asa and brilinta as well as atorvastatin, carvedilol  End-stage renal disease with hemodialysis -  Per renal, it would be optimal to rest the fistula- ideally should have TDC placement but likely not possible in the setting of  DAPT requirement. PCT has met with pt who expressed desire to continue HD - RIJV HD cath placed 2/16 per IR  Peripheral vascular disease with critical limb ischemia - Per Dr. Donnetta Hutching pt is very poor surgical candidate - S/P Aortogram with bilateral LE runoff, left popliteal stenting 12/26/2016 by Dr. Sherren Mocha Early  - Pain is particularly worse with HD  Hyperlipidemia - Continue statin therapy   Bioprosthetic aortic valve replacement 2011  - Recent echocardiogram in December 2017 showed normal function  Coronary artery disease status post CABG 2011  - SVG to PDA stenting  Anemia of chronic kidney disease - Hgb 8.6, 8.4, 7.8 - Transfuse for hgb less than 7  Hyponatremia - Sodium 128 (126-129 range)  Delirium - Again more altered this am but overall better since admission   Hypothyroidism  - Continue synthroid   Bilateral LEs and left foot toes pressure injury  - Left medial LE:  3.5 x 2 with depth obscured by the presence of eschar, no exudate - Left lateral LE:  3cm x 1cm x 0.1cm partial thickness with pink, moist base and scant serous exudate - Right LE (anterior):  2.5cm x 1.5cm x 0.2cm full thickness wound with light yellow, moist wound bed and small amount of serous exudate - Dressing procedure/placement/frequency: Conservative wound care using betadine swab sticks to the left toes with eschar in an effort to keep them dry and infection free.  Prevalon Boots are provided bilaterally to protect from injury.  The LE wounds care twice daily with saline moistened gauze and topped with protective silicone foam.  DVT prophylaxis: Diona Fanti, brilinta  Code Status: DNR/DNI Family Communication: no family at the bedside this am Disposition Plan: to SNF once determined stable from vascular and nephrology standpoint    Consultants:   Cardiology   Nephrology   Vascular surgery, Dr. Sherren Mocha Early   Palliative care   WOC  Procedures:   ESDR on HD TTS  Aortogram with bilateral  LE runoff, left popliteal stenting - 01/06/2017  Antimicrobials:   None    Subjective: No overnight events.   Objective: Vitals:   12/26/16 0125 12/26/16 0437 12/26/16 1344 12/26/16 1349  BP: (!) 137/51 (!) 141/41 (!) 129/59 (!) 122/49  Pulse:  (!) 59 (!) 55 (!) 56  Resp:  _0 Temp:  99 F (37.2 C) 98.4 F (36.9 C)   TempSrc:  Axillary Oral   SpO2: 95% 94% 96%   Weight:  71.6 kg (157 lb 12.8 oz)    Height:       No intake or output data in the 24 hours ending 12/26/16 1425 Filed Weights   12/24/16 0706 12/25/16 0520 12/26/16 0437  Weight: 70.7 kg (155 lb 13.8 oz) 72 kg (158 lb 11.7 oz) 71.6 kg (157 lb 12.8 oz)    Examination:  General exam: No acute distress Respiratory system: No wheezing, no rhonchi  Cardiovascular system: S1 & S2 heard, Rate controlled; SEM appreciate 2/6 Gastrointestinal system: (+) BS, non tender  Central nervous system: disoriented this am Extremities: No tenderness, necrotic toes  Skin: LLE necrotic wounds on medical aspect, L toes necrotic; LUA AVF Psychiatry: not agitated, not restless   Data Reviewed: I have personally reviewed following labs and imaging studies  CBC:  Recent Labs Lab 12/22/16 0739 12/31/2016 0530 12/24/16 0700 12/25/16 0305 12/26/16 0510  WBC 12.0* 10.4 10.0 9.3 7.8  HGB 8.6* 8.4* 8.1* 7.8* 8.0*  HCT 25.2* 25.3* 24.5* 23.8* 24.6*  MCV 95.1 95.8 97.2 97.9 98.8  PLT 283 302 293 294 161   Basic Metabolic Panel:  Recent Labs Lab 12/22/16 0739 12/22/2016 0530 12/24/16 0700 12/25/16 0305 12/26/16 0510  NA 127* 129* 126* 129* 128*  K 4.1 3.7 4.0 3.5 3.8  CL 92* 93* 91* 92* 91*  CO2 _1 GLUCOSE 104* 83 79 96 95  BUN 46* 26* 36* 24* 40*  CREATININE 5.29* 3.40* 4.37* 3.89* 4.63*  CALCIUM 8.0* 7.7* 7.8* 7.4* 7.8*  PHOS 4.7*  --  5.4*  --   --    GFR: Estimated Creatinine Clearance: 9 mL/min (by C-G formula based on SCr of 4.63 mg/dL (H)). Liver Function Tests:  Recent Labs Lab  12/22/16 0739 12/24/16 0700  ALBUMIN 2.3* 2.1*   No results for input(s): LIPASE, AMYLASE in the last 168 hours. No results for input(s): AMMONIA in the last 168 hours. Coagulation Profile:  Recent Labs Lab 12/22/2016 0530  INR 1.17   Cardiac Enzymes: No results for input(s): CKTOTAL, CKMB, CKMBINDEX, TROPONINI in the last 168 hours. BNP (last 3 results) No results for input(s): PROBNP in the last 8760 hours. HbA1C: No results for input(s): HGBA1C in the last 72 hours. CBG:  Recent Labs Lab 12/20/16 1943  GLUCAP 138*   Lipid Profile: No results for input(s): CHOL, HDL, LDLCALC, TRIG, CHOLHDL, LDLDIRECT in the last 72 hours. Thyroid Function Tests: No results for input(s): TSH, T4TOTAL, FREET4, T3FREE, THYROIDAB in the last 72 hours. Anemia Panel: No results for input(s): VITAMINB12, FOLATE, FERRITIN, TIBC, IRON, RETICCTPCT in the last 72 hours. Urine analysis:  Component Value Date/Time   COLORURINE YELLOW 10/28/2016 1600   APPEARANCEUR CLEAR 10/28/2016 1600   LABSPEC 1.011 10/28/2016 1600   PHURINE 6.5 10/28/2016 1600   GLUCOSEU NEGATIVE 10/28/2016 1600   HGBUR NEGATIVE 10/28/2016 1600   BILIRUBINUR NEGATIVE 10/28/2016 1600   KETONESUR NEGATIVE 10/28/2016 1600   PROTEINUR 30 (A) 10/28/2016 1600   UROBILINOGEN 1.0 01/30/2010 1104   NITRITE NEGATIVE 10/28/2016 1600   LEUKOCYTESUR NEGATIVE 10/28/2016 1600   Sepsis Labs: _0 (procalcitonin:4,lacticidven:4)    Recent Results (from the past 240 hour(s))  MRSA PCR Screening     Status: None   Collection Time: 12/17/2016  8:18 PM  Result Value Ref Range Status   MRSA by PCR NEGATIVE NEGATIVE Final      Radiology Studies: Dg Chest Port 1 View Result Date: 12/19/2016 Cardiomegaly with central vascular congestion. Possible small left effusion. No overt pulmonary edema.    Scheduled Meds: . acetaminophen  1,000 mg Oral TID  . aspirin  81 mg Oral Daily  . atorvastatin  80 mg Oral q1800  . carvedilol   3.125 mg Oral BID WC  .  ceFAZolin IV  1 g Intravenous To OR  . cholecalciferol  2,000 Units Oral Daily  . gabapentin  200 mg Oral BID  . mouth rinse  15 mL Mouth Rinse BID  . sucroferric oxyhydroxide  500 mg Oral TID WC  . ticagrelor  90 mg Oral BID  . vitamin B-12  1,500 mcg Oral Daily   Continuous Infusions: . sodium chloride 10 mL/hr at 12/20/2016 1235     LOS: 8 days    Time spent: 15 minutes  Greater than 50% of the time spent on counseling and coordinating the care.   Leisa Lenz, MD [facility] Pager [phone]  If 7PM-7AM, please contact night-coverage [email or web address] Password [email or web address] 12/26/2016, 2:25 PM

## 2016-12-26 NOTE — Progress Notes (Signed)
Patient is stating that she has heartburn, patient also coughing.  She requested 1 TUMS.  I have paged Dr Elisabeth Pigeonevine for this.

## 2016-12-26 NOTE — Progress Notes (Signed)
Pt was crying because she needs to "use the bathroom." pt is AXO to self, place, times, and situation but I am not sure why she was crying. No tears. Pt stopped crying once I put her on the bedpan, and started having a normal conversation about my night.  Added that she has some pain in her left leg too. PRN acetaminophen and alprazolam given.

## 2016-12-26 NOTE — Progress Notes (Signed)
Patient stated that she was having continued reflux.  Spoke to Dr Devine, verbal order for gi cocktail every 4 hours prn received.   

## 2016-12-27 ENCOUNTER — Inpatient Hospital Stay (HOSPITAL_COMMUNITY): Payer: Medicare HMO

## 2016-12-27 LAB — CBC
HCT: 25.6 % — ABNORMAL LOW (ref 36.0–46.0)
Hemoglobin: 8.4 g/dL — ABNORMAL LOW (ref 12.0–15.0)
MCH: 32.3 pg (ref 26.0–34.0)
MCHC: 32.8 g/dL (ref 30.0–36.0)
MCV: 98.5 fL (ref 78.0–100.0)
PLATELETS: 323 10*3/uL (ref 150–400)
RBC: 2.6 MIL/uL — ABNORMAL LOW (ref 3.87–5.11)
RDW: 18.1 % — ABNORMAL HIGH (ref 11.5–15.5)
WBC: 10.7 10*3/uL — ABNORMAL HIGH (ref 4.0–10.5)

## 2016-12-27 LAB — BASIC METABOLIC PANEL
Anion gap: 9 (ref 5–15)
BUN: 22 mg/dL — AB (ref 6–20)
CALCIUM: 7.8 mg/dL — AB (ref 8.9–10.3)
CHLORIDE: 94 mmol/L — AB (ref 101–111)
CO2: 26 mmol/L (ref 22–32)
CREATININE: 2.66 mg/dL — AB (ref 0.44–1.00)
GFR calc Af Amer: 19 mL/min — ABNORMAL LOW (ref >60)
GFR calc non Af Amer: 16 mL/min — ABNORMAL LOW (ref >60)
GLUCOSE: 107 mg/dL — AB (ref 65–99)
Potassium: 3.6 mmol/L (ref 3.5–5.1)
Sodium: 129 mmol/L — ABNORMAL LOW (ref 135–145)

## 2016-12-27 MED ORDER — DOCUSATE SODIUM 100 MG PO CAPS
100.0000 mg | ORAL_CAPSULE | Freq: Two times a day (BID) | ORAL | Status: DC
  Administered 2016-12-27 – 2017-01-01 (×8): 100 mg via ORAL
  Filled 2016-12-27 (×9): qty 1

## 2016-12-27 NOTE — Progress Notes (Signed)
[facility] Progress Note   Subjective:  Resting fistula due to repeated infiltrations.  nontunneled HD catheter placed.  + delirious.   Objective Vitals:   12/26/16 2228 12/27/16 0421 12/27/16 0700 12/27/16 0800  BP:  (!) 172/40    Pulse:  61 61 69  Resp: (!) 22 14 18 19   Temp:  98 F (36.7 C)    TempSrc:  Oral    SpO2:  95% 95% 94%  Weight:  71.8 kg (158 lb 3.2 oz)    Height:       Physical Exam General: Chronically ill appearing female in NAD, lying in bed, drifts off to sleep during conversation. Heart: 3/6 harsh systolic M radiating across precordium.  Lungs: CTAB. No WOB.  Abdomen: soft, non-tender  Extremities: Pink foam drsgs LLE-necrotic appearing wounds L shin and toes.  ecchymosis R groin and down inside of R thigh. No edema.  Dialysis Access: LUA AVF + bruit RIJ temp cath drsg intact   Additional Objective Labs: Basic Metabolic Panel:  Recent Labs Lab 12/22/16 0739  12/24/16 0700 12/25/16 0305 12/26/16 0510 12/27/16 0418  NA 127*  < > 126* 129* 128* 129*  K 4.1  < > 4.0 3.5 3.8 3.6  CL 92*  < > 91* 92* 91* 94*  CO2 23  < > 24 28 27 26   GLUCOSE 104*  < > 79 96 95 107*  BUN 46*  < > 36* 24* 40* 22*  CREATININE 5.29*  < > 4.37* 3.89* 4.63* 2.66*  CALCIUM 8.0*  < > 7.8* 7.4* 7.8* 7.8*  PHOS 4.7*  --  5.4*  --   --   --   < > = values in this interval not displayed. Liver Function Tests:  Recent Labs Lab 12/22/16 0739 12/24/16 0700  ALBUMIN 2.3* 2.1*   No results for input(s): LIPASE, AMYLASE in the last 168 hours. CBC:  Recent Labs Lab 01-21-17 0530 12/24/16 0700 12/25/16 0305 12/26/16 0510 12/27/16 0418  WBC 10.4 10.0 9.3 7.8 10.7*  HGB 8.4* 8.1* 7.8* 8.0* 8.4*  HCT 25.3* 24.5* 23.8* 24.6* 25.6*  MCV 95.8 97.2 97.9 98.8 98.5  PLT 302 293 294 289 323   Blood Culture    Component Value Date/Time   SDES URINE, CATHETERIZED 10/28/2016 1600   SPECREQUEST NONE 10/28/2016 1600   CULT NO GROWTH Performed at [facility]  10/28/2016 1600   REPTSTATUS 10/29/2016 FINAL 10/28/2016 1600    Cardiac Enzymes: No results for input(s): CKTOTAL, CKMB, CKMBINDEX, TROPONINI in the last 168 hours. CBG:  Recent Labs Lab 12/20/16 1943  GLUCAP 138*   Iron Studies: No results for input(s): IRON, TIBC, TRANSFERRIN, FERRITIN in the last 72 hours. @lablastinr3 @ Studies/Results: Ir Fluoro Guide Cv Line Right  Result Date: 12/25/2016 INDICATION: Renal failure EXAM: IR RIGHT FLOURO GUIDE CV LINE; IR ULTRASOUND GUIDANCE VASC ACCESS RIGHT MEDICATIONS: None. ANESTHESIA/SEDATION: None. FLUOROSCOPY TIME:  Fluoroscopy Time: 4 minutes 30 seconds (26 mGy). COMPLICATIONS: None immediate. PROCEDURE: Informed written consent was obtained from the patient after a thorough discussion of the procedural risks, benefits and alternatives. All questions were addressed. Maximal Sterile Barrier Technique was utilized including caps, mask, sterile gowns, sterile gloves, sterile drape, hand hygiene and skin antiseptic. A timeout was performed prior to the initiation of the procedure. The right neck was prepped and draped in a sterile fashion. 1% lidocaine was utilized for local anesthesia. Under sonographic guidance, a micropuncture needle was inserted into the right jugular vein and up sized to  an Amplatz wire. The dilator followed by the temporary dialysis catheter were advanced over the wire to the cavoatrial junction. It was flushed and sewn in place with an 0 Prolene knot. FINDINGS: A temporary right jugular dialysis catheter has been placed with its tip at the cavoatrial junction. IMPRESSION: Successful temporary right jugular dialysis catheter placement with its tip at the cavoatrial junction. Electronically Signed   By: Jolaine ClickArthur  Hoss M.D.   On: 12/25/2016 16:40   Ir [place] Guide Vasc Access Right  Result Date: 12/25/2016 INDICATION: Renal failure EXAM: IR RIGHT FLOURO GUIDE CV LINE; IR ULTRASOUND GUIDANCE VASC ACCESS RIGHT MEDICATIONS: None.  ANESTHESIA/SEDATION: None. FLUOROSCOPY TIME:  Fluoroscopy Time: 4 minutes 30 seconds (26 mGy). COMPLICATIONS: None immediate. PROCEDURE: Informed written consent was obtained from the patient after a thorough discussion of the procedural risks, benefits and alternatives. All questions were addressed. Maximal Sterile Barrier Technique was utilized including caps, mask, sterile gowns, sterile gloves, sterile drape, hand hygiene and skin antiseptic. A timeout was performed prior to the initiation of the procedure. The right neck was prepped and draped in a sterile fashion. 1% lidocaine was utilized for local anesthesia. Under sonographic guidance, a micropuncture needle was inserted into the right jugular vein and up sized to an Amplatz wire. The dilator followed by the temporary dialysis catheter were advanced over the wire to the cavoatrial junction. It was flushed and sewn in place with an 0 Prolene knot. FINDINGS: A temporary right jugular dialysis catheter has been placed with its tip at the cavoatrial junction. IMPRESSION: Successful temporary right jugular dialysis catheter placement with its tip at the cavoatrial junction. Electronically Signed   By: Jolaine ClickArthur  Hoss M.D.   On: 12/25/2016 16:40   Medications: . sodium chloride 10 mL/hr at Jan 23, 2017 1235   . acetaminophen  1,000 mg Oral TID  . aspirin  81 mg Oral Daily  . atorvastatin  80 mg Oral q1800  . bisacodyl  10 mg Rectal Once  . carvedilol  3.125 mg Oral BID WC  . cholecalciferol  2,000 Units Oral Daily  . darbepoetin (ARANESP) injection - DIALYSIS  100 mcg Intravenous Q Thu-HD  . feeding supplement (PRO-STAT SUGAR FREE 64)  30 mL Oral BID  . gabapentin  200 mg Oral BID  . heparin  5,000 Units Subcutaneous Q8H  . levothyroxine  112 mcg Oral QAC breakfast  . mirtazapine  7.5 mg Oral QHS  . senna  1 tablet Oral QHS  . sodium chloride flush  3 mL Intravenous Q12H  . sucroferric oxyhydroxide  500 mg Oral TID WC  . ticagrelor  90 mg Oral BID   . vitamin B-12  1,500 mcg Oral Daily     Dialysis:TTS NW 4h 67kg 2/2.25 bath Hep 2000 LUA AVF 16ga needles (does not want 15 ga) - darbe 200/ wk last 2/8 - venofer 100 mg/ hd, one dose left - last Hb 8, Ca 9, P 5.8, pth 251 - tums 1 ac, cozaar 50 hs  Assessment: 1. Acute MI - sp PCI to SVG-PDA 2/9. Started on [place]; on DAPT 2. ESRD on HD TTS - started HD 6 wks ago.  HD today.  Problems with infiltration.  It would be optimal to rest the fistula- Had RIJ temp cath placed 12/25/16 per IR. Appreciate help from Dr. Bonnielee HaffHoss.  She is having a hard time coping with dialysis.  Her LLE pain is expectedly worse during dialysis and if there are no options to mitigate critical limb ischemia it may be  appropriate to discuss shifting focus of care to symptom management/ comfort rather than aggressive care.  Appreciate palliative care involvement.  Her pain is unremitting in dialysis and another family meeting is in order.  Appreciate Dr. Onnie Boer and palliative care team involvement. Needs to rest fistula for at least another treatment.   3.  PAD / L leg ulcers - per VVS, sig disease LLE; s/p L popliteal stenting 4. HTN/ vol - does not appear overloaded.  EDWs being gotten in the bed. Wt today 71.6 kg. Still above OP EDW. UFG 1.5-2 liters ordered. Hopefully can stay on HD for full treatment but has issues with pain toward end of tx.  5. Hx CABG/ bio-AVR 2011 6. Anemia of CKD - Hb in the mid 8s.  Aranesp 100 q Thursday. Completed an iron load as an outpatient.   7. MBD - Phoslo 2 capsules AC before meals- in setting of bad atherosclerosis, changing to velphoro 1 AC TID; following Phos 8.  Nutrition: albumin 2.3, prostat  9. Constipation: Has impaction. Per primary.     Bufford Buttner MD 12/27/2016, 12:50 PM  [facility] [phone]

## 2016-12-27 NOTE — Progress Notes (Addendum)
Patient ID: Jill Johnson, female   DOB: [birth year], 76 y.o.   MRN: [id number]  PROGRESS NOTE    Jill Johnson  RXV:[id number] DOB: 01/03/1941 DOA: 12/12/2016  PCP: Marjorie Smolder, MD   Brief Narrative:  76 year old female with past medical history significant for end-stage renal disease on hemodialysis (TTS) started about 2 months ago, CAD status post CABG and AVR (AV replacement), hypothyroidism, hypertension, recent critical limb ischemia. Initially under cardiology primary care and [facility] consulted for evaluation of delirium. Please note patient under [facility] primary care starting 12/22/2016.  Patient has had left leg/foot pain for past month or so prior to this admission. She saw Dr. early in clinic and the plan was for outpatient angiography. In short stay, prior to the procedure, patient started to have chest pain and EKG showed new ST depressions. Patient was taken to cath lab on 12/17/2016. She is status post cardiac catheterization with SVG to PDA DES stent placement.  Jill Johnson also underwent aortogram with bilateral LE runoff, left popliteal stenting on 12/20/2016.   Jill Johnson had problems with infiltration. Per renal, it would be optimal to rest the fistula- ideally should have TDC placement but likely not possible in the setting of DAPT requirement. PCT has met with Jill Johnson who expressed desire to continue HD. RIJV HD catheter placed 2/16.   Assessment & Plan:   Non-ST elevation myocardial infarction  - SVG to PDA stent placement, DES.  - Dual antiplatelet therapy for at least 6 months per cardiology   - Continue with Brilinta for now, aspirin. Per cario, possibly change to plavix in future  - Per cardio, continue aggressive secondary prevention with asa and brilinta as well as atorvastatin, carvedilol  End-stage renal disease with hemodialysis -  Per renal, it would be optimal to rest the fistula- ideally should have TDC placement but likely not possible in the setting of DAPT  requirement. PCT has met with Jill Johnson who expressed desire to continue HD - RIJV HD cath placed 2/16 per IR - HG per renal scheduled - Has had HD Saturday   Peripheral vascular disease with critical limb ischemia - Per Dr. Donnetta Hutching Jill Johnson is very poor surgical candidate - S/P Aortogram with bilateral LE runoff, left popliteal stenting 12/12/2016 by Dr. Sherren Mocha Early   Hyperlipidemia - Continue statin therapy   Bioprosthetic aortic valve replacement 2011  - Recent echocardiogram in December 2017 showed normal function  Coronary artery disease status post CABG 2011  - SVG to PDA stenting  Anemia of chronic kidney disease - Hgb stable  - Transfuse for hgb less than 7  Hyponatremia - Sodium 128 (126-129 range)  Delirium - Intermittent confusion, she is alert however    Hypothyroidism  - Continue synthroid   Bilateral LEs and left foot toes pressure injury  - Left medial LE:  3.5 x 2 with depth obscured by the presence of eschar, no exudate - Left lateral LE:  3cm x 1cm x 0.1cm partial thickness with pink, moist base and scant serous exudate - Right LE (anterior):  2.5cm x 1.5cm x 0.2cm full thickness wound with light yellow, moist wound bed and small amount of serous exudate - Dressing procedure/placement/frequency: Conservative wound care using betadine swab sticks to the left toes with eschar in an effort to keep them dry and infection free.  Prevalon Boots are provided bilaterally to protect from injury.  The LE wounds care twice daily with saline moistened gauze and topped with protective silicone foam.     DVT  prophylaxis: Asa, brilinta  Code Status: DNR/DNI Family Communication: no family at the bedside this am Disposition Plan: to [facility] cleared by renal possibly Monday    Consultants:   Cardiology   Nephrology   Vascular surgery, Dr. Sherren Mocha Early   Palliative care   WOC  Procedures:   ESDR on HD TTS  Aortogram with bilateral LE runoff, left popliteal stenting -  01/03/2017  RIJV HF catheter placed 2/16  Antimicrobials:   None    Subjective: No overnight events.   Objective: Vitals:   12/26/16 2228 12/27/16 0421 12/27/16 0700 12/27/16 0800  BP:  (!) 172/40    Pulse:  61 61 69  Resp: (!) _0 Temp:  98 F (36.7 C)    TempSrc:  Oral    SpO2:  95% 95% 94%  Weight:  71.8 kg (158 lb 3.2 oz)    Height:        Intake/Output Summary (Last 24 hours) at 12/27/16 1151 Last data filed at 12/27/16 0900  Gross per 24 hour  Intake              582 ml  Output             1000 ml  Net             -418 ml   Filed Weights   12/26/16 1344 12/26/16 1714 12/27/16 0421  Weight: 73 kg (160 lb 15 oz) 72 kg (158 lb 11.7 oz) 71.8 kg (158 lb 3.2 oz)    Examination:  General exam: No acute distress, comfortable  Respiratory system: No wheezing, no rhonchi  Cardiovascular system: S1 & S2 heard, Rate controlled; SEM appreciate 2/6 Gastrointestinal system: (+) BS, non tender, no distended  Central nervous system: no focal deficits other than being intermittently disoriented  Extremities: No tenderness, necrotic toes  Skin: LLE necrotic wounds on medical aspect, L toes necrotic; LUA AVF Psychiatry: alert but not oriented   Data Reviewed: I have personally reviewed following labs and imaging studies  CBC:  Recent Labs Lab 12/22/2016 0530 12/24/16 0700 12/25/16 0305 12/26/16 0510 12/27/16 0418  WBC 10.4 10.0 9.3 7.8 10.7*  HGB 8.4* 8.1* 7.8* 8.0* 8.4*  HCT 25.3* 24.5* 23.8* 24.6* 25.6*  MCV 95.8 97.2 97.9 98.8 98.5  PLT 302 293 294 289 166   Basic Metabolic Panel:  Recent Labs Lab 12/22/16 0739 12/22/2016 0530 12/24/16 0700 12/25/16 0305 12/26/16 0510 12/27/16 0418  NA 127* 129* 126* 129* 128* 129*  K 4.1 3.7 4.0 3.5 3.8 3.6  CL 92* 93* 91* 92* 91* 94*  CO2 _1 GLUCOSE 104* 83 79 96 95 107*  BUN 46* 26* 36* 24* 40* 22*  CREATININE 5.29* 3.40* 4.37* 3.89* 4.63* 2.66*  CALCIUM 8.0* 7.7* 7.8* 7.4* 7.8* 7.8*    PHOS 4.7*  --  5.4*  --   --   --    GFR: Estimated Creatinine Clearance: 15.7 mL/min (by C-G formula based on SCr of 2.66 mg/dL (H)). Liver Function Tests:  Recent Labs Lab 12/22/16 0739 12/24/16 0700  ALBUMIN 2.3* 2.1*   No results for input(s): LIPASE, AMYLASE in the last 168 hours. No results for input(s): AMMONIA in the last 168 hours. Coagulation Profile:  Recent Labs Lab 12/20/2016 0530  INR 1.17   Cardiac Enzymes: No results for input(s): CKTOTAL, CKMB, CKMBINDEX, TROPONINI in the last 168 hours. BNP (last 3 results) No results for input(s): PROBNP in the last 8760  hours. HbA1C: No results for input(s): HGBA1C in the last 72 hours. CBG:  Recent Labs Lab 12/20/16 1943  GLUCAP 138*   Lipid Profile: No results for input(s): CHOL, HDL, LDLCALC, TRIG, CHOLHDL, LDLDIRECT in the last 72 hours. Thyroid Function Tests: No results for input(s): TSH, T4TOTAL, FREET4, T3FREE, THYROIDAB in the last 72 hours. Anemia Panel: No results for input(s): VITAMINB12, FOLATE, FERRITIN, TIBC, IRON, RETICCTPCT in the last 72 hours. Urine analysis:    Component Value Date/Time   COLORURINE YELLOW 10/28/2016 1600   APPEARANCEUR CLEAR 10/28/2016 1600   LABSPEC 1.011 10/28/2016 1600   PHURINE 6.5 10/28/2016 1600   GLUCOSEU NEGATIVE 10/28/2016 1600   HGBUR NEGATIVE 10/28/2016 1600   BILIRUBINUR NEGATIVE 10/28/2016 1600   KETONESUR NEGATIVE 10/28/2016 1600   PROTEINUR 30 (A) 10/28/2016 1600   UROBILINOGEN 1.0 01/30/2010 1104   NITRITE NEGATIVE 10/28/2016 1600   LEUKOCYTESUR NEGATIVE 10/28/2016 1600   Sepsis Labs: _0 (procalcitonin:4,lacticidven:4)    Recent Results (from the past 240 hour(s))  MRSA PCR Screening     Status: None   Collection Time: 12/16/2016  8:18 PM  Result Value Ref Range Status   MRSA by PCR NEGATIVE NEGATIVE Final      Radiology Studies: Dg Chest Port 1 View Result Date: 12/19/2016 Cardiomegaly with central vascular congestion. Possible small  left effusion. No overt pulmonary edema.    Scheduled Meds: . acetaminophen  1,000 mg Oral TID  . aspirin  81 mg Oral Daily  . atorvastatin  80 mg Oral q1800  . carvedilol  3.125 mg Oral BID WC  .  ceFAZolin IV  1 g Intravenous To OR  . cholecalciferol  2,000 Units Oral Daily  . gabapentin  200 mg Oral BID  . mouth rinse  15 mL Mouth Rinse BID  . sucroferric oxyhydroxide  500 mg Oral TID WC  . ticagrelor  90 mg Oral BID  . vitamin B-12  1,500 mcg Oral Daily   Continuous Infusions: . sodium chloride 10 mL/hr at 01/04/2017 1235     LOS: 9 days    Time spent: 15 minutes  Greater than 50% of the time spent on counseling and coordinating the care.   Leisa Lenz, MD [facility] Pager [phone]  If 7PM-7AM, please contact night-coverage [email or web address] Password [facility] 12/27/2016, 11:51 AM

## 2016-12-28 ENCOUNTER — Inpatient Hospital Stay (HOSPITAL_COMMUNITY): Payer: Medicare HMO

## 2016-12-28 DIAGNOSIS — I639 Cerebral infarction, unspecified: Secondary | ICD-10-CM

## 2016-12-28 LAB — BASIC METABOLIC PANEL
Anion gap: 9 (ref 5–15)
BUN: 40 mg/dL — ABNORMAL HIGH (ref 6–20)
CHLORIDE: 93 mmol/L — AB (ref 101–111)
CO2: 25 mmol/L (ref 22–32)
Calcium: 7.8 mg/dL — ABNORMAL LOW (ref 8.9–10.3)
Creatinine, Ser: 3.63 mg/dL — ABNORMAL HIGH (ref 0.44–1.00)
GFR calc non Af Amer: 11 mL/min — ABNORMAL LOW (ref >60)
GFR, EST AFRICAN AMERICAN: 13 mL/min — AB (ref >60)
Glucose, Bld: 87 mg/dL (ref 65–99)
POTASSIUM: 4.2 mmol/L (ref 3.5–5.1)
Sodium: 127 mmol/L — ABNORMAL LOW (ref 135–145)

## 2016-12-28 LAB — CBC
HEMATOCRIT: 22.8 % — AB (ref 36.0–46.0)
Hemoglobin: 7.5 g/dL — ABNORMAL LOW (ref 12.0–15.0)
MCH: 32.5 pg (ref 26.0–34.0)
MCHC: 32.9 g/dL (ref 30.0–36.0)
MCV: 98.7 fL (ref 78.0–100.0)
Platelets: 326 10*3/uL (ref 150–400)
RBC: 2.31 MIL/uL — AB (ref 3.87–5.11)
RDW: 18.8 % — ABNORMAL HIGH (ref 11.5–15.5)
WBC: 11.8 10*3/uL — AB (ref 4.0–10.5)

## 2016-12-28 MED ORDER — STROKE: EARLY STAGES OF RECOVERY BOOK
Freq: Once | Status: AC
  Administered 2016-12-28: 1
  Filled 2016-12-28: qty 1

## 2016-12-28 MED ORDER — SUCROFERRIC OXYHYDROXIDE 500 MG PO CHEW
1000.0000 mg | CHEWABLE_TABLET | Freq: Three times a day (TID) | ORAL | Status: DC
  Administered 2016-12-28 – 2017-01-01 (×8): 1000 mg via ORAL
  Filled 2016-12-28 (×16): qty 2

## 2016-12-28 MED ORDER — RENA-VITE PO TABS
1.0000 | ORAL_TABLET | Freq: Every day | ORAL | Status: DC
  Administered 2016-12-29 – 2016-12-31 (×3): 1 via ORAL
  Filled 2016-12-28 (×4): qty 1

## 2016-12-28 MED ORDER — DARBEPOETIN ALFA 200 MCG/0.4ML IJ SOSY
200.0000 ug | PREFILLED_SYRINGE | INTRAMUSCULAR | Status: DC
  Administered 2016-12-31: 200 ug via INTRAVENOUS
  Filled 2016-12-28: qty 0.4

## 2016-12-28 NOTE — Progress Notes (Signed)
Attempted to follow up this afternoon.  Patient was not able to engage to discuss the past few days or her desires moving forward.  No family at the bedside.  While I am transitioning off service, someone from PMT will follow-up tomorrow to revisit goals with patient and family.  Romie MinusGene Sydny Schnitzler, MD [facility] Palliative Medicine Team [phone]

## 2016-12-28 NOTE — Progress Notes (Addendum)
RN notified Dr Elisabeth Pigeonevine of MRI results this Am at 0815 AM. Patient made NPO until cleared by speech therapy, as directed by Dr Elisabeth Pigeonevine. RN called speech therapy office at 1025 AM, as patient had not yet been seen by a speech therapist this AM and RN did not see a speech therapist assigned under the patient's care team. Representative from rehabilitation office stated that the patient had not yet been assigned to a therapist yet and that the representative will notify speech therapy appropriately.  Speech therapy called and stated that patient passed RN stroke swallow exam and therefore did not need a formal speech therapy evaluation. RN will resume patient's previous diet and administer medications as appropriate

## 2016-12-28 NOTE — Consult Note (Signed)
Requesting Physician: Dr. Elisabeth Pigeon    Chief Complaint: TIA  History obtained from:  Patient     HPI:                                                                                                                                         Jill Johnson is an 76 y.o. 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 Initially under cardiology primary care and [facility] consulted for evaluation of delirium. Pt also underwent aortogram with bilateral LE runoff, left popliteal stenting on 12/25/2016. While in the hospital patient has been diagnosed with non-ST elevation myocardial infarction, end-stage renal disease with hemodialysis, peripheral vascular disease with critical limb ischemia on the left.  Although patient does have peripheral vascular disease and as stated chronic left critical limb ischemia of her left arm patient had left-sided weakness and was sent for MRI of brain. MRI brain showed a punctate 5 mm focus of restricted diffusion within the peripheral aspect of the inferior left cerebellar hemisphere and thus neurology was consult  Date last known well: Unable to determine Time last known well: Unable to determine tPA Given: no LSN   Past Medical History:  Diagnosis Date  . Anemia   . CAD (coronary artery disease)   . CFS (chronic fatigue syndrome)   . Chronic rhinitis   . Complication of anesthesia    "a little hard waking up"  . Constipation    due to iron  . Coronary atherosclerosis   . Critical lower limb ischemia 12/13/2016  . Depression   . Difficult intubation    "I have a small opening" When she had her CABG   . DJD (degenerative joint disease)   . ESRD on hemodialysis (HCC) 12/12/2016  . Hyperlipidemia   . Hypertension   . Hypothyroidism   . Kidney stone   . MDD (major depressive  disorder)   . Neuropathy (HCC)    in legs  . Obesity   . Osteopenia   . PAD (peripheral artery disease) (HCC) 12/24/2016  . Renal insufficiency    Post Op  . Stroke [facility])    TIA, several    Past Surgical History:  Procedure Laterality Date  . ABDOMINAL HYSTERECTOMY  1976  . AORTIC VALVE REPLACEMENT    . AORTOGRAM N/A 01/06/2017   Procedure: AORTOGRAM WITH BILATERAL LOWER EXTREMITY RUNOFF; LEFT POPLITEAL STENTING;  Surgeon: Larina Earthly, MD;  Location: [facility] OR;  Service: Vascular;  Laterality: N/A;  . AV FISTULA PLACEMENT Left 08/07/2016   Procedure: CREATION OF  LEFT UPPER ARM ARTERIOVENOUS (AV) FISTULA;  Surgeon: Larina Earthly, MD;  Location: [facility] OR;  Service: Vascular;  Laterality: Left;  . BACK SURGERY  1980's  . CARDIAC VALVE REPLACEMENT    .  CORONARY ARTERY BYPASS GRAFT    . CORONARY STENT INTERVENTION N/A 12/26/2016   Procedure: Coronary Stent Intervention;  Surgeon: Tonny Bollman, MD;  Location: [facility] INVASIVE CV LAB;  Service: Cardiovascular;  Laterality: N/A;  . IR GENERIC HISTORICAL  12/25/2016   IR US GUIDE VASC ACCESS RIGHT 12/25/2016 Jolaine Click, MD [facility]  . IR GENERIC HISTORICAL  12/25/2016   IR FLUORO GUIDE CV LINE RIGHT 12/25/2016 Jolaine Click, MD [facility]  . KNEE ARTHROSCOPY    . LEFT HEART CATH AND CORS/GRAFTS ANGIOGRAPHY N/A 12/27/2016   Procedure: Left Heart Cath and Cors/Grafts Angiography;  Surgeon: Tonny Bollman, MD;  Location: [facility] INVASIVE CV LAB;  Service: Cardiovascular;  Laterality: N/A;    Family History  Problem Relation Age of Onset  . Alzheimer's disease Mother   . Aortic stenosis Father   . Heart disease Father   . Heart disease Son    Social History:  reports that she has never smoked. She has never used smokeless tobacco. She reports that she does not drink alcohol or use drugs.  Allergies:  Allergies  Allergen Reactions  . Prednisone Other (See Comments)    Makes her loony    Medications:                                                                                                                            Prior to Admission:  Prescriptions Prior to Admission  Medication Sig Dispense Refill Last Dose  . acetaminophen (TYLENOL) 325 MG tablet Take 2 tablets (650 mg total) by mouth every 6 (six) hours as needed for mild pain (or Fever >/= 101). 30 tablet 0 Taking  . amLODipine (NORVASC) 10 MG tablet Take 1 tablet (10 mg total) by mouth daily. 30 tablet 5 12/25/2016 at 0800  . aspirin 81 MG tablet Take 1 tablet (81 mg total) by mouth daily. 30 tablet 0 12/17/2016 at Unknown time  . calcium acetate (PHOSLO) 667 MG capsule Take 667 mg by mouth 3 (three) times daily with meals.    12/17/2016 at Unknown time  . cholecalciferol (VITAMIN D) 1000 units tablet Take 2,000 Units by mouth daily.    12/17/2016 at Unknown time  . Cyanocobalamin (VITAMIN B-12 CR) 1500 MCG TBCR Take 1,500 mcg by mouth daily.   12/17/2016 at Unknown time  . gabapentin (NEURONTIN) 300 MG capsule Take 300 mg by mouth 2 (two) times daily.    12/17/2016 at Unknown time  . levothyroxine (SYNTHROID, LEVOTHROID) 112 MCG tablet Take 112 mcg by mouth daily before breakfast.    12/17/2016 at Unknown time  . LORazepam (ATIVAN) 0.5 MG tablet Take 0.5 mg by mouth 2 (two) times daily as needed. Anxiety/pain.   12/17/2016 at Unknown time  . losartan (COZAAR) 50 MG tablet Take 1 tablet (50 mg total) by mouth at bedtime. 30 tablet 0 01/03/2017 at 0800  . senna-docusate (SENOKOT-S) 8.6-50 MG tablet Take 1 tablet by mouth at bedtime as needed for mild  constipation. 30 tablet 0 Past Week at Unknown time  . traMADol (ULTRAM) 50 MG tablet Take 50 mg by mouth 4 (four) times daily as needed for pain.   01/03/2017 at 0530  . Darbepoetin Alfa (ARANESP) 150 MCG/0.3ML SOSY injection Inject 0.3 mLs (150 mcg total) into the vein every Wednesday with hemodialysis. (Patient not taking: Reported on 12/16/2016) 1.68 mL  Not Taking at Unknown time  . Dextromethorphan-Guaifenesin (CORICIDIN HBP CONGESTION/COUGH) 10-200 MG  CAPS Take 1 capsule by mouth daily as needed (cold).   More than a month at Unknown time   Scheduled: . acetaminophen  1,000 mg Oral TID  . aspirin  81 mg Oral Daily  . atorvastatin  80 mg Oral q1800  . bisacodyl  10 mg Rectal Once  . carvedilol  3.125 mg Oral BID WC  . [START ON 12/31/2016] darbepoetin (ARANESP) injection - DIALYSIS  200 mcg Intravenous Q Thu-HD  . docusate sodium  100 mg Oral BID  . feeding supplement (PRO-STAT SUGAR FREE 64)  30 mL Oral BID  . gabapentin  200 mg Oral BID  . heparin  5,000 Units Subcutaneous Q8H  . levothyroxine  112 mcg Oral QAC breakfast  . mirtazapine  7.5 mg Oral QHS  . multivitamin  1 tablet Oral QHS  . senna  1 tablet Oral QHS  . sodium chloride flush  3 mL Intravenous Q12H  . sucroferric oxyhydroxide  1,000 mg Oral TID WC  . ticagrelor  90 mg Oral BID  . vitamin B-12  1,500 mcg Oral Daily   Continuous: . sodium chloride 10 mL/hr at 01/04/2017 1235    ROS:                                                                                                                                       History obtained from the patient  General ROS: negative for - chills, fatigue, fever, night sweats, weight gain or weight loss Psychological ROS: negative for - behavioral disorder, hallucinations, memory difficulties, mood swings or suicidal ideation Ophthalmic ROS: negative for - blurry vision, double vision, eye pain or loss of vision ENT ROS: negative for - epistaxis, nasal discharge, oral lesions, sore throat, tinnitus or vertigo Allergy and Immunology ROS: negative for - hives or itchy/watery eyes Hematological and Lymphatic ROS: negative for - bleeding problems, bruising or swollen lymph nodes Endocrine ROS: negative for - galactorrhea, hair pattern changes, polydipsia/polyuria or temperature intolerance Respiratory ROS: negative for - cough, hemoptysis, shortness of breath or wheezing Cardiovascular ROS: negative for - chest pain, dyspnea on  exertion, edema or irregular heartbeat Gastrointestinal ROS: negative for - abdominal pain, diarrhea, hematemesis, nausea/vomiting or stool incontinence Genito-Urinary ROS: negative for - dysuria, hematuria, incontinence or urinary frequency/urgency Musculoskeletal ROS: negative for - joint swelling or muscular weakness Neurological ROS: as noted in HPI Dermatological ROS: negative for rash and skin lesion changes  Neurologic Examination:  Blood pressure (!) 125/95, pulse 69, temperature 98 F (36.7 C), temperature source Oral, resp. rate 13, height 5' (1.524 m), weight 72.7 kg (160 lb 4.8 oz), SpO2 100 %.  HEENT-  Normocephalic, no lesions, without obvious abnormality.  Normal external eye and conjunctiva.  Normal TM's bilaterally.  Normal auditory canals and external ears. Normal external nose, mucus membranes and septum.  Normal pharynx. Cardiovascular- S1, S2 normal, pulses palpable throughout   Lungs- chest clear, no wheezing, rales, normal symmetric air entry, Heart exam - S1, S2 normal, no murmur, no gallop, rate regular Abdomen- normal findings: bowel sounds normal Extremities- no edema Lymph-no adenopathy palpable Musculoskeletal-no joint tenderness, deformity or swelling Skin-warm and dry, no hyperpigmentation, vitiligo, or suspicious lesions  Neurological Examination Mental Status: Alert, oriented, thought content appropriate.  Speech fluent without evidence of aphasia.  Able to follow 3 step commands without difficulty. Cranial Nerves: II:  Visual fields grossly normal,  III,IV, VI: ptosis not present, extra-ocular motions intact bilaterally, pupils equal, round, reactive to light and accommodation V,VII: smile symmetric, facial light touch sensation normal bilaterally VIII: hearing normal bilaterally IX,X: uvula rises symmetrically XI: bilateral shoulder shrug XII: midline  tongue extension Motor: Right : Upper extremity   5/5    Left:     Upper extremity   5/5  Lower extremity   4/5     Lower extremity   4/5 --both due to pain Tone and bulk:normal tone throughout; no atrophy noted Sensory: Pinprick and light touch intact throughout UE and decreased in LE dur to PVD Deep Tendon Reflexes: 1+ and symmetric throughout UE and no LE DTR Plantars: mute Cerebellar: normal finger-to-nose, to much pain to take part in H-S Gait: not tested       Lab Results: Basic Metabolic Panel:  Recent Labs Lab 12/22/16 0739  12/24/16 0700 12/25/16 0305 12/26/16 0510 12/27/16 0418 12/28/16 0431  NA 127*  < > 126* 129* 128* 129* 127*  K 4.1  < > 4.0 3.5 3.8 3.6 4.2  CL 92*  < > 91* 92* 91* 94* 93*  CO2 23  < > 24 28 27 26 25   GLUCOSE 104*  < > 79 96 95 107* 87  BUN 46*  < > 36* 24* 40* 22* 40*  CREATININE 5.29*  < > 4.37* 3.89* 4.63* 2.66* 3.63*  CALCIUM 8.0*  < > 7.8* 7.4* 7.8* 7.8* 7.8*  PHOS 4.7*  --  5.4*  --   --   --   --   < > = values in this interval not displayed.  Liver Function Tests:  Recent Labs Lab 12/22/16 0739 12/24/16 0700  ALBUMIN 2.3* 2.1*   No results for input(s): LIPASE, AMYLASE in the last 168 hours. No results for input(s): AMMONIA in the last 168 hours.  CBC:  Recent Labs Lab 12/24/16 0700 12/25/16 0305 12/26/16 0510 12/27/16 0418 12/28/16 0431  WBC 10.0 9.3 7.8 10.7* 11.8*  HGB 8.1* 7.8* 8.0* 8.4* 7.5*  HCT 24.5* 23.8* 24.6* 25.6* 22.8*  MCV 97.2 97.9 98.8 98.5 98.7  PLT 293 294 289 323 326    Cardiac Enzymes: No results for input(s): CKTOTAL, CKMB, CKMBINDEX, TROPONINI in the last 168 hours.  Lipid Panel: No results for input(s): CHOL, TRIG, HDL, CHOLHDL, VLDL, LDLCALC in the last 168 hours.  CBG: No results for input(s): GLUCAP in the last 168 hours.  Microbiology: Results for orders placed or performed during the hospital encounter of 12/27/2016  MRSA PCR Screening     Status:  None   Collection Time:  01/12/17  8:18 PM  Result Value Ref Range Status   MRSA by PCR NEGATIVE NEGATIVE Final    Comment:        The GeneXpert MRSA Assay (FDA approved for NASAL specimens only), is one component of a comprehensive MRSA colonization surveillance program. It is not intended to diagnose MRSA infection nor to guide or monitor treatment for MRSA infections.   Surgical PCR screen     Status: None   Collection Time: 12/18/2016  4:31 AM  Result Value Ref Range Status   MRSA, PCR NEGATIVE NEGATIVE Final   Staphylococcus aureus NEGATIVE NEGATIVE Final    Comment:        The Xpert SA Assay (FDA approved for NASAL specimens in patients over 101 years of age), is one component of a comprehensive surveillance program.  Test performance has been validated by [facility] for patients greater than or equal to 72 year old. It is not intended to diagnose infection nor to guide or monitor treatment.     Coagulation Studies: No results for input(s): LABPROT, INR in the last 72 hours.  Imaging: Mr Brain Wo Contrast  Result Date: 12/27/2016 CLINICAL DATA:  Initial evaluation for acute left-sided weakness, lethargy. EXAM: MRI HEAD WITHOUT CONTRAST TECHNIQUE: Multiplanar, multiecho pulse sequences of the brain and surrounding structures were obtained without intravenous contrast. COMPARISON:  None available. FINDINGS: Brain: Diffuse prominence of the CSF containing spaces is compatible with generalized age-related cerebral atrophy. Patchy and confluent T2/FLAIR hyperintensity within the periventricular and deep white matter both cerebral hemispheres most compatible with chronic small vessel ischemic disease, moderate nature. Chronic microvascular ischemic changes present within the pons as well. Superimposed remote lacunar infarcts present within the bilateral basal ganglia and pons. There is a remote cortical/ subcortical infarct within the posterior right frontal parietal region near the vertex. Focal  encephalomalacia at the anterior right temporal pole may be related to remote ischemia or possibly trauma. There is a punctate 5 mm focus of restricted diffusion within the peripheral aspect of the inferior left cerebellar hemisphere (series 4, image 5). Consistent with a tiny acute ischemic infarct. No associated hemorrhage or mass effect. No other evidence for acute or subacute ischemia. Gray-white matter differentiation otherwise maintained. Few scattered foci of susceptibility artifact present within the bilateral cerebral hemispheres, with additional prominent focus within the right cerebellar hemisphere. Findings most consistent with small chronic micro hemorrhages, most likely hypertensive in nature. No mass lesion, midline shift or mass effect. No hydrocephalus. No extra-axial fluid collection. Major dural sinuses are grossly patent. 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 Vascular: Abnormal flow void within the distal left vertebral artery, which may be related to slow flow and/ or occlusion. Major intracranial vascular flow voids otherwise maintained. Skull and upper cervical spine: Degenerative thickening of the tectorial membrane noted without significant stenosis. Remainder the visualized upper cervical spine otherwise unremarkable. Craniocervical junction otherwise unremarkable. Bone marrow signal intensity within normal limits. No scalp soft tissue abnormality. Sinuses/Orbits: Globes and orbital soft tissues within normal limits. Mild scattered mucosal thickening within the ethmoidal air cells and left sphenoid sinus. Paranasal sinuses are otherwise clear. Partially visualized left parotid gland is larger in size as compared to the partially visualized right, of uncertain significance. Additionally, there is mildly increased associated diffusion signal is compared to the contralateral right. Question acute parotitis. IMPRESSION: 1. Punctate 5 mm acute ischemic infarct within the peripheral aspect of  the inferior left cerebellar hemisphere. No associated hemorrhage  or mass effect. 2. Generalized cerebral atrophy with moderate chronic microvascular ischemic disease and multiple remote infarcts as detailed above. 3. Few scattered foci of susceptibility artifact involving the supratentorial brain and right cerebellar hemisphere, consistent with small chronic micro hemorrhages. These are most commonly related to chronic underlying hypertension. 4. Apparent asymmetric enlargement of the partially visualized left parotid gland, indeterminate. Correlation with physical exam for possible acute parotitis recommended. Electronically Signed   By: Rise MuBenjamin  McClintock M.D.   On: 12/27/2016 23:44       Assessment and plan discussed with with attending physician and they are in agreement.    Felicie MornDavid Smith PA-C [facility] [phone]  12/28/2016, 11:01 AM   Assessment: 76 y.o. female with new incidental  punctate left cerebellar infarct. At this time patient would benefit from stroke workup.  Stroke Risk Factors - hyperlipidemia and hypertension  Recommend  2. MRI, MRA  of the brain without contrast 3. PT consult, OT consult, Speech consult 4. Echocardiogram 5. Carotid dopplers 6. Prophylactic therapy-Antiplatelet med: Aspirin - dose 81 mg daily 7. Risk factor modification 8. Telemetry monitoring 9. Frequent neuro checks 10 NPO until passes stroke swallow screen 11 please page stroke NP  Herbie Baltimore( Sharon Bibe) Or  PA  Or MD from 8am -4 pm  as this patient from this time will be  followed by the stroke.   You can look them up on [email or web address]  Password [email or web address]

## 2016-12-28 NOTE — Evaluation (Signed)
Clinical/Bedside Swallow Evaluation Patient Details  Name: Jill Johnson MRN: [id number] Date of Birth: April 17, 1941  Today's Date: 12/28/2016 Time: SLP Start Time (ACUTE ONLY): 1312 SLP Stop Time (ACUTE ONLY): 1337 SLP Time Calculation (min) (ACUTE ONLY): 25 min  Past Medical History:  Past Medical History:  Diagnosis Date  . Anemia   . CAD (coronary artery disease)   . CFS (chronic fatigue syndrome)   . Chronic rhinitis   . Complication of anesthesia    "a little hard waking up"  . Constipation    due to iron  . Coronary atherosclerosis   . Critical lower limb ischemia 01/03/2017  . Depression   . Difficult intubation    "I have a small opening" When she had her CABG   . DJD (degenerative joint disease)   . ESRD on hemodialysis (HCC) 12/31/2016  . Hyperlipidemia   . Hypertension   . Hypothyroidism   . Kidney stone   . MDD (major depressive disorder)   . Neuropathy (HCC)    in legs  . Obesity   . Osteopenia   . PAD (peripheral artery disease) (HCC) 12/27/2016  . Renal insufficiency    Post Op  . Stroke [facility])    TIA, several   Past Surgical History:  Past Surgical History:  Procedure Laterality Date  . ABDOMINAL HYSTERECTOMY  1976  . AORTIC VALVE REPLACEMENT    . AORTOGRAM N/A 2017/01/19   Procedure: AORTOGRAM WITH BILATERAL LOWER EXTREMITY RUNOFF; LEFT POPLITEAL STENTING;  Surgeon: Larina Earthly, MD;  Location: [facility] OR;  Service: Vascular;  Laterality: N/A;  . AV FISTULA PLACEMENT Left 08/07/2016   Procedure: CREATION OF  LEFT UPPER ARM ARTERIOVENOUS (AV) FISTULA;  Surgeon: Larina Earthly, MD;  Location: [facility] OR;  Service: Vascular;  Laterality: Left;  . BACK SURGERY  1980's  . CARDIAC VALVE REPLACEMENT    . CORONARY ARTERY BYPASS GRAFT    . CORONARY STENT INTERVENTION N/A 12/21/2016   Procedure: Coronary Stent Intervention;  Surgeon: Tonny Bollman, MD;  Location: [facility] INVASIVE CV LAB;  Service: Cardiovascular;  Laterality: N/A;  . IR GENERIC HISTORICAL  12/25/2016   IR US GUIDE  VASC ACCESS RIGHT 12/25/2016 Jolaine Click, MD [facility]  . IR GENERIC HISTORICAL  12/25/2016   IR FLUORO GUIDE CV LINE RIGHT 12/25/2016 Jolaine Click, MD [facility]  . KNEE ARTHROSCOPY    . LEFT HEART CATH AND CORS/GRAFTS ANGIOGRAPHY N/A 12/25/2016   Procedure: Left Heart Cath and Cors/Grafts Angiography;  Surgeon: Tonny Bollman, MD;  Location: [facility] INVASIVE CV LAB;  Service: Cardiovascular;  Laterality: N/A;   HPI:  Jill Johnson is an 76 y.o. 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 Initially under cardiology primary care and [facility] consulted for evaluation of delirium. Pt also underwent aortogram with bilateral LE runoff, left popliteal stenting on Jan 19, 2017. While in the hospital patient has been diagnosed with non-ST elevation myocardial infarction, end-stage renal disease with hemodialysis, peripheral vascular disease with critical limb ischemia on the left.  Although patient does have peripheral vascular disease and as stated chronic left critical limb ischemia of her left arm patient had left-sided weakness and was sent for MRI of brain. MRI brain showed a punctate 5 mm focus of restricted diffusion within the peripheral aspect of the inferior left cerebellar hemisphere   Assessment / Plan / Recommendation Clinical Impression  Patient wthout overt s/s of aspiration at bedside with intact oral transit of bolus and swift appearing swallow initiation, however confusion, decreased attention, lethargy, and general deconditioning may impact swallowing function, increasing aspiration risk. Will f/u to monitor for tolerance and determine need for additional intervention.  SLP Visit Diagnosis: Dysphagia, unspecified (R13.10)    Aspiration Risk  Mild aspiration risk    Diet Recommendation Regular;Thin  liquid   Liquid Administration via: Cup;Straw Medication Administration: Whole meds with liquid Supervision: Patient able to self feed;Intermittent supervision to cue for compensatory strategies Compensations: Slow rate;Small sips/bites Postural Changes: Seated upright at 90 degrees    Other  Recommendations Oral Care Recommendations: Oral care BID   Follow up Recommendations None      Frequency and Duration min 1 x/week  1 week           Swallow Study   General HPI: Jill Johnson is an 76 y.o. 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 Initially under cardiology primary care and [facility] consulted for evaluation of delirium. Pt also underwent aortogram with bilateral LE runoff, left popliteal stenting on 12/13/2016. While in the hospital patient has been diagnosed with non-ST elevation myocardial infarction, end-stage renal disease with hemodialysis, peripheral vascular disease with critical limb ischemia on the left.  Although patient does have peripheral vascular disease and as stated chronic left critical limb ischemia of her left arm patient had left-sided weakness and was sent for MRI of brain. MRI brain showed a punctate 5 mm focus of restricted diffusion within the peripheral aspect of the inferior left cerebellar hemisphere Type of Study: Bedside Swallow Evaluation Previous Swallow Assessment: none Diet Prior to this Study: Regular;Thin liquids Temperature Spikes Noted: No Respiratory Status: Nasal cannula History of Recent Intubation: No Behavior/Cognition: Lethargic/Drowsy;Cooperative;Distractible;Requires cueing Oral Cavity Assessment: Dry Oral Care Completed by SLP: Recent completion by staff Oral Cavity - Dentition: Adequate natural dentition Vision: Functional for  self-feeding Self-Feeding Abilities: Able to feed self Patient Positioning: Upright in bed Baseline Vocal Quality: Hoarse Volitional Cough: Strong Volitional Swallow: Able to elicit    Oral/Motor/Sensory Function Overall Oral Motor/Sensory Function: Generalized oral weakness   Ice Chips Ice chips: Not tested   Thin Liquid Thin Liquid: Within functional limits Presentation: Cup;Self Fed;Straw    Nectar Thick Nectar Thick Liquid: Not tested   Honey Thick Honey Thick Liquid: Not tested   Puree Puree: Within functional limits Presentation: Spoon   Solid   GO  Aleksa Collinsworth MA, CCC-SLP [phone]  Solid: Within functional limits Presentation: Self Fed        Tava Peery Meryl 12/28/2016,3:51 PM

## 2016-12-28 NOTE — Progress Notes (Signed)
Orders received for swallow evaluation however per chart review,  patient has passed the RN stroke swallow screen.  Per protocol, patient may initiate a po diet. Defer formal evaluation per protocol. Please re-consult as needed if concerns arise as patient starts pos.  Thank you  Ferdinand LangoLeah Rasul Decola MA, CCC-SLP [phone]

## 2016-12-28 NOTE — Progress Notes (Signed)
*  PRELIMINARY RESULTS* Vascular Ultrasound Carotid Duplex (Doppler) has been completed.  Findings suggest 1-39% internal carotid artery stenosis bilaterally. The right vertebral artery is patent with antegrade flow. The left vertebral artery is patent with atypical flow.  12/28/2016 11:34 AM Gertie FeyMichelle Belinda Bringhurst, BS, RVT, RDCS, RDMS

## 2016-12-28 NOTE — Progress Notes (Signed)
Physical Therapy Treatment Patient Details Name: Jill Johnson L Finstad MRN: [id number] DOB: July 20, 1941 Today's Date: 12/28/2016    History of Present Illness Patient is a 76 y/o female with hx of HTN, HLD, CKD, CAD s/p CABG, AVR, depression, severe PVD with critical limb ischemia of left foot with ulcerated wounds, ESRD on HD, hypothyroidiam admitted for outpatient angiogram. In short stay, pt with chest pain. Found to have NSTEMI s/p cardiac catheterization with SVG to PDA DES stent placement. 2/18 Lt cerebellar CVA without     PT Comments    Pt with RLE off of bed, gown off and self caddy corner in bed on arrival. Pt internally distracted stating pain in RLE. Pt able to follow commands to transition to sitting but continues to require max +2 to stand or pivot. Dr.Myers present during session and stated no new BP or activity restrictions. Pt able to move bil UE and LE with generalized weakness without noted focal deficit but not formally tested. Will continue to follow to maximize activity and function.   HR 61-69 sats 91% on RA BP 141/35 supine 125/95 in chair   Follow Up Recommendations  SNF;Supervision for mobility/OOB;Supervision/Assistance - 24 hour     Equipment Recommendations       Recommendations for Other Services       Precautions / Restrictions Precautions Precautions: Fall    Mobility  Bed Mobility Overal bed mobility: Needs Assistance Bed Mobility: Supine to Sit     Supine to sit: Min assist     General bed mobility comments: cues for sequence with pt able to bring bil LE off of bed with HHA to elevate trunk from surface, increased time  Transfers Overall transfer level: Needs assistance   Transfers: Sit to/from Stand;Stand Pivot Transfers Sit to Stand: Max assist;+2 physical assistance Stand pivot transfers: Max assist;+2 physical assistance       General transfer comment: assist of 2 x 2 trials to stand from chair with pt initially not using LUE but with  cues able to utilize to assist into standing with pt maintaining flexed posture despite physical assist and multimodal cues. pt unable to transition feet in standing and max assist to pivot pelvis to chair. pt able to scoot back in chair with only cues  Ambulation/Gait             General Gait Details: pt unable   Stairs            Wheelchair Mobility    Modified Rankin (Stroke Patients Only)       Balance Overall balance assessment: Needs assistance   Sitting balance-Leahy Scale: Fair       Standing balance-Leahy Scale: Poor                      Cognition Arousal/Alertness: Awake/alert Behavior During Therapy: Anxious Overall Cognitive Status: Impaired/Different from baseline Area of Impairment: Problem solving;Attention   Current Attention Level: Sustained           General Comments: pt with decreased command following, decreased effortful movement with transfers, pt distracted by pain    Exercises General Exercises - Lower Extremity Long Arc Quad: AROM;Both;Seated;10 reps Hip Flexion/Marching: AROM;Seated;Both;10 reps    General Comments        Pertinent Vitals/Pain Pain Assessment: Faces Faces Pain Scale: Hurts whole lot Pain Location: LLE Pain Descriptors / Indicators: Sore;Aching Pain Intervention(s): Limited activity within patient's tolerance;Repositioned;Monitored during session    Home Living  Prior Function            PT Goals (current goals can now be found in the care plan section) Progress towards PT goals: Not progressing toward goals - comment    Frequency           PT Plan Current plan remains appropriate    Co-evaluation             End of Session Equipment Utilized During Treatment: Gait belt Activity Tolerance: Patient tolerated treatment well Patient left: in chair;with call bell/phone within reach;with chair alarm set     Time: 1610-9604 PT Time Calculation  (min) (ACUTE ONLY): 23 min  Charges:  $Therapeutic Activity: 23-37 mins                    G Codes:      Carsen Leaf B Lihanna Biever 01/27/17, 9:56 AM Delaney Meigs, PT [phone]

## 2016-12-28 NOTE — Progress Notes (Addendum)
Patient ID: THETA LEAF, female   DOB: 27-Feb-1941, 76 y.o.   MRN: [id number]  PROGRESS NOTE    Jill Johnson  EVO:[id number] DOB: 19-Sep-1941 DOA: 12/14/2016  PCP: Marjorie Smolder, MD   Brief Narrative:  76 year old female with past medical history significant for end-stage renal disease on hemodialysis (TTS) started about 2 months ago, CAD status post CABG and AVR (AV replacement), hypothyroidism, hypertension, recent critical limb ischemia. Initially under cardiology primary care and [facility] consulted for evaluation of delirium. Please note patient under [facility] primary care starting 12/22/2016.  Patient has had left leg/foot pain for past month or so prior to this admission. She saw Dr. early in clinic and the plan was for outpatient angiography. In short stay, prior to the procedure, patient started to have chest pain and EKG showed new ST depressions. Patient was taken to cath lab on 12/10/2016. She is status post cardiac catheterization with SVG to PDA DES stent placement.  Pt also underwent aortogram with bilateral LE runoff, left popliteal stenting on 01/04/2017.   Pt had problems with infiltration. Per renal, it would be optimal to rest the fistula- ideally should have TDC placement but likely not possible in the setting of DAPT requirement. PCT has met with pt who expressed desire to continue HD. RIJV HD catheter placed 2/16.   Assessment & Plan:  End-stage renal disease with hemodialysis -  Per renal, need to rest the fistula- ideally should have TDC placement but likely not possible in the setting of DAPT requirement. PCT has met with pt who expressed desire to continue HD - RIJV HD cath placed 2/16 per IR - HD per renal scheduled - Has had HD Saturday    Acute ischemic infarct left cerebellar hemisphere - Stroke work up initiated - MRI brain - Punctate 5 mm acute ischemic infarct within the peripheral aspect of the inferior left cerebellar hemisphere. No associated  hemorrhage or mass effect. - 2D ECHO - pending  - Carotid doppler - pending  - HgbA1c, Lipid panel - pending. LDL goal < 100. Patient on Lipitor 80 mg at bedtime  - Diet: NPO until swallow evaluation completed  - Therapy: PT/OT - SNF recommended on discharge  - Appreciate neurology following  Non-ST elevation myocardial infarction  - SVG to PDA stent placement, DES.  - Dual antiplatelet therapy for at least 6 months per cardiology   - Continue with Brilinta for now, aspirin. Per cario, possibly change to plavix in future  - Per cardio, continue aggressive secondary prevention with asa and brilinta as well as atorvastatin, carvedilol  Peripheral vascular disease with critical limb ischemia - Per Dr. Donnetta Hutching pt is very poor surgical candidate - S/P Aortogram with bilateral LE runoff, left popliteal stenting 01/02/2017 by Dr. Sherren Mocha Early   Hyperlipidemia - Continue statin therapy   Bioprosthetic aortic valve replacement 2011  - Recent echocardiogram in December 2017 showed normal function  Coronary artery disease status post CABG 2011  - SVG to PDA stenting  Anemia of chronic kidney disease - Hgb 7.5 this am - Transfuse for hgb less than 7  Hyponatremia - Sodium 127 this am  Delirium - Stable, intermittent confusion   Hypothyroidism  - Continue synthroid   Bilateral LEs and left foot toes pressure injury  - Left medial LE:  3.5 x 2 with depth obscured by the presence of eschar, no exudate - Left lateral LE:  3cm x 1cm x 0.1cm partial thickness with pink, moist base and scant serous exudate -  Right LE (anterior):  2.5cm x 1.5cm x 0.2cm full thickness wound with light yellow, moist wound bed and small amount of serous exudate - Dressing procedure/placement/frequency: Conservative wound care using betadine swab sticks to the left toes with eschar in an effort to keep them dry and infection free.  Prevalon Boots are provided bilaterally to protect from injury.  The LE wounds  care twice daily with saline moistened gauze and topped with protective silicone foam.     DVT prophylaxis: Diona Fanti, brilinta  Code Status: DNR/DNI Family Communication: no family at the bedside this am Disposition Plan: new stroke on MRI brain, needs work up    Consultants:   Cardiology   Nephrology   Vascular surgery, Dr. Sherren Mocha Early   Palliative care   WOC  Procedures:   ESDR on HD TTS  Aortogram with bilateral LE runoff, left popliteal stenting - 12/21/2016  RIJV HF catheter placed 2/16  Antimicrobials:   None    Subjective: No overnight events.   Objective: Vitals:   12/27/16 2007 12/28/16 0036 12/28/16 0500 12/28/16 0951  BP: (!) 132/45 (!) 170/36 (!) 141/35 (!) 125/95  Pulse: (!) 53  (!) 58 69  Resp: _0 Temp: 98.9 F (37.2 C) 98.9 F (37.2 C) 98 F (36.7 C)   TempSrc: Oral Oral Oral   SpO2: 92% 94% 100%   Weight:   72.7 kg (160 lb 4.8 oz)   Height:        Intake/Output Summary (Last 24 hours) at 12/28/16 1057 Last data filed at 12/28/16 0900  Gross per 24 hour  Intake              480 ml  Output                1 ml  Net              479 ml   Filed Weights   12/26/16 1714 12/27/16 0421 12/28/16 0500  Weight: 72 kg (158 lb 11.7 oz) 71.8 kg (158 lb 3.2 oz) 72.7 kg (160 lb 4.8 oz)    Examination:  General exam: No acute distress Respiratory system: No wheezing, no rhonchi  Cardiovascular system: S1 & S2 heard, Rate controlled; SEM appreciate 2/6 Gastrointestinal system: (+) BS, no distention  Central nervous system: good strength bilaterally, lifts LLE and RLE as well as both UE against resistance Extremities: No tenderness, necrotic toes  Skin: LLE necrotic wounds on medical aspect, L toes necrotic; LUA AVF Psychiatry: normal mood and behavior   Data Reviewed: I have personally reviewed following labs and imaging studies  CBC:  Recent Labs Lab 12/24/16 0700 12/25/16 0305 12/26/16 0510 12/27/16 0418 12/28/16 0431  WBC 10.0  9.3 7.8 10.7* 11.8*  HGB 8.1* 7.8* 8.0* 8.4* 7.5*  HCT 24.5* 23.8* 24.6* 25.6* 22.8*  MCV 97.2 97.9 98.8 98.5 98.7  PLT 293 294 289 323 779   Basic Metabolic Panel:  Recent Labs Lab 12/22/16 0739  12/24/16 0700 12/25/16 0305 12/26/16 0510 12/27/16 0418 12/28/16 0431  NA 127*  < > 126* 129* 128* 129* 127*  K 4.1  < > 4.0 3.5 3.8 3.6 4.2  CL 92*  < > 91* 92* 91* 94* 93*  CO2 23  < > _1 GLUCOSE 104*  < > 79 96 95 107* 87  BUN 46*  < > 36* 24* 40* 22* 40*  CREATININE 5.29*  < > 4.37* 3.89* 4.63* 2.66* 3.63*  CALCIUM  8.0*  < > 7.8* 7.4* 7.8* 7.8* 7.8*  PHOS 4.7*  --  5.4*  --   --   --   --   < > = values in this interval not displayed. GFR: Estimated Creatinine Clearance: 11.6 mL/min (by C-G formula based on SCr of 3.63 mg/dL (H)). Liver Function Tests:  Recent Labs Lab 12/22/16 0739 12/24/16 0700  ALBUMIN 2.3* 2.1*   No results for input(s): LIPASE, AMYLASE in the last 168 hours. No results for input(s): AMMONIA in the last 168 hours. Coagulation Profile:  Recent Labs Lab 01/01/2017 0530  INR 1.17   Cardiac Enzymes: No results for input(s): CKTOTAL, CKMB, CKMBINDEX, TROPONINI in the last 168 hours. BNP (last 3 results) No results for input(s): PROBNP in the last 8760 hours. HbA1C: No results for input(s): HGBA1C in the last 72 hours. CBG: No results for input(s): GLUCAP in the last 168 hours. Lipid Profile: No results for input(s): CHOL, HDL, LDLCALC, TRIG, CHOLHDL, LDLDIRECT in the last 72 hours. Thyroid Function Tests: No results for input(s): TSH, T4TOTAL, FREET4, T3FREE, THYROIDAB in the last 72 hours. Anemia Panel: No results for input(s): VITAMINB12, FOLATE, FERRITIN, TIBC, IRON, RETICCTPCT in the last 72 hours. Urine analysis:    Component Value Date/Time   COLORURINE YELLOW 10/28/2016 1600   APPEARANCEUR CLEAR 10/28/2016 1600   LABSPEC 1.011 10/28/2016 1600   PHURINE 6.5 10/28/2016 1600   GLUCOSEU NEGATIVE 10/28/2016 1600   HGBUR  NEGATIVE 10/28/2016 1600   BILIRUBINUR NEGATIVE 10/28/2016 1600   KETONESUR NEGATIVE 10/28/2016 1600   PROTEINUR 30 (A) 10/28/2016 1600   UROBILINOGEN 1.0 01/30/2010 1104   NITRITE NEGATIVE 10/28/2016 1600   LEUKOCYTESUR NEGATIVE 10/28/2016 1600   Sepsis Labs: _0 (procalcitonin:4,lacticidven:4)    Recent Results (from the past 240 hour(s))  MRSA PCR Screening     Status: None   Collection Time: 01/06/2017  8:18 PM  Result Value Ref Range Status   MRSA by PCR NEGATIVE NEGATIVE Final     Radiology Studies: Mr Brain Wo Contrast Result Date: 12/27/2016 1. Punctate 5 mm acute ischemic infarct within the peripheral aspect of the inferior left cerebellar hemisphere. No associated hemorrhage or mass effect. 2. Generalized cerebral atrophy with moderate chronic microvascular ischemic disease and multiple remote infarcts as detailed above. 3. Few scattered foci of susceptibility artifact involving the supratentorial brain and right cerebellar hemisphere, consistent with small chronic micro hemorrhages. These are most commonly related to chronic underlying hypertension. 4. Apparent asymmetric enlargement of the partially visualized left parotid gland, indeterminate. Correlation with physical exam for possible acute parotitis recommended.   Ir Fluoro Guide Cv Line Right Result Date: 12/25/2016 Successful temporary right jugular dialysis catheter placement with its tip at the cavoatrial junction.   Ir US Guide Vasc Access Right Result Date: 12/25/2016 Successful temporary right jugular dialysis catheter placement with its tip at the cavoatrial junction.  Dg Chest Port 1 View Result Date: 12/19/2016 Cardiomegaly with central vascular congestion. Possible small left effusion. No overt pulmonary edema.    Scheduled Meds: . acetaminophen  1,000 mg Oral TID  . aspirin  81 mg Oral Daily  . atorvastatin  80 mg Oral q1800  . carvedilol  3.125 mg Oral BID WC  .  ceFAZolin IV  1 g Intravenous To  OR  . cholecalciferol  2,000 Units Oral Daily  . gabapentin  200 mg Oral BID  . mouth rinse  15 mL Mouth Rinse BID  . sucroferric oxyhydroxide  500 mg Oral TID WC  . ticagrelor  90 mg Oral BID  . vitamin B-12  1,500 mcg Oral Daily   Continuous Infusions: . sodium chloride 10 mL/hr at 01/04/2017 1235     LOS: 10 days    Time spent: 25 minutes  Greater than 50% of the time spent on counseling and coordinating the care.   Jill Lenz, MD [facility] Pager [phone]  If 7PM-7AM, please contact night-coverage [email or web address] Password [facility] 12/28/2016, 10:57 AM

## 2016-12-28 NOTE — Clinical Social Work Note (Signed)
Jill Johnson, ED with [facility] updated on patient's condition. CSW will continue to follow to assist with patient's DC to ALF when appropriate.   Roddie McBryant Teofilo Lupinacci MSW, [place]ylvan[place]CS[place], [place], [phone]

## 2016-12-28 NOTE — Progress Notes (Signed)
Subjective: Interval History: Arousable but very somnolent. Able to answer questions when aroused. Reports pain in her right groin and hip following the carotid duplex. Had some difficulty swallowing neurologic changes over the weekend. 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 Objective: Vital signs in last 24 hours: Temp:  [98 F (36.7 C)-99.5 F (37.5 C)] 98 F (36.7 C) (02/19 0500) Pulse Rate:  [53-69] 69 (02/19 0951) Resp:  [13-16] 13 (02/19 0500) BP: (125-170)/(35-95) 125/95 (02/19 0951) SpO2:  [92 %-100 %] 98 % (02/19 1500) FiO2 (%):  [32 %] 32 % (02/19 1122) Weight:  [160 lb 4.8 oz (72.7 kg)] 160 lb 4.8 oz (72.7 kg) (02/19 0500)  Intake/Output from previous day: 02/18 0701 - 02/19 0700 In: 600 [P.O.:600] Out: 1 [Stool:1] Intake/Output this shift: No intake/output data recorded.  Easily palpable right popliteal pulse. Skin tear of her right pretibial area is nearly healed. On the left her foot looks markedly better. Does have demarcation of the gut dry gangrenous changes on the toes of her foot. Does have full-thickness skin loss on the medial left calf and partial thickness on the posterior and lateral calf  Lab Results:  Recent Labs  12/27/16 0418 12/28/16 0431  WBC 10.7* 11.8*  HGB 8.4* 7.5*  HCT 25.6* 22.8*  PLT 323 326   BMET  Recent Labs  12/27/16 0418 12/28/16 0431  NA 129* 127*  K 3.6 4.2  CL 94* 93*  CO2 26 25  GLUCOSE 107* 87  BUN 22* 40*  CREATININE 2.66* 3.63*  CALCIUM 7.8* 7.8*    Studies/Results: Mr Brain Wo Contrast  Result Date: 12/27/2016 CLINICAL DATA:  Initial evaluation for acute left-sided weakness, lethargy. EXAM: MRI HEAD WITHOUT CONTRAST TECHNIQUE: Multiplanar, multiecho pulse sequences of the brain and surrounding structures were obtained without intravenous contrast. COMPARISON:  None available. FINDINGS: Brain: Diffuse prominence of the CSF containing spaces is compatible with generalized age-related cerebral atrophy.  Patchy and confluent T2/FLAIR hyperintensity within the periventricular and deep white matter both cerebral hemispheres most compatible with chronic small vessel ischemic disease, moderate nature. Chronic microvascular ischemic changes present within the pons as well. Superimposed remote lacunar infarcts present within the bilateral basal ganglia and pons. There is a remote cortical/ subcortical infarct within the posterior right frontal parietal region near the vertex. Focal encephalomalacia at the anterior right temporal pole may be related to remote ischemia or possibly trauma. There is a punctate 5 mm focus of restricted diffusion within the peripheral aspect of the inferior left cerebellar hemisphere (series 4, image 5). Consistent with a tiny acute ischemic infarct. No associated hemorrhage or mass effect. No other evidence for acute or subacute ischemia. Gray-white matter differentiation otherwise maintained. Few scattered foci of susceptibility artifact present within the bilateral cerebral hemispheres, with additional prominent focus within the right cerebellar hemisphere. Findings most consistent with small chronic micro hemorrhages, most likely hypertensive in nature. No mass lesion, midline shift or mass effect. No hydrocephalus. No extra-axial fluid collection. Major dural sinuses are grossly patent. 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 Vascular: Abnormal flow void within the distal left vertebral artery, which may be related to slow flow and/ or occlusion. Major intracranial vascular flow voids otherwise maintained. Skull and upper cervical spine: Degenerative thickening of the tectorial membrane noted without significant stenosis. Remainder the visualized upper cervical spine otherwise unremarkable. Craniocervical junction otherwise unremarkable. Bone marrow signal intensity within normal limits. No scalp soft tissue abnormality. Sinuses/Orbits: Globes and orbital soft tissues within normal limits.  Mild scattered  mucosal thickening within the ethmoidal air cells and left sphenoid sinus. Paranasal sinuses are otherwise clear. Partially visualized left parotid gland is larger in size as compared to the partially visualized right, of uncertain significance. Additionally, there is mildly increased associated diffusion signal is compared to the contralateral right. Question acute parotitis. IMPRESSION: 1. Punctate 5 mm acute ischemic infarct within the peripheral aspect of the inferior left cerebellar hemisphere. No associated hemorrhage or mass effect. 2. Generalized cerebral atrophy with moderate chronic microvascular ischemic disease and multiple remote infarcts as detailed above. 3. Few scattered foci of susceptibility artifact involving the supratentorial brain and right cerebellar hemisphere, consistent with small chronic micro hemorrhages. These are most commonly related to chronic underlying hypertension. 4. Apparent asymmetric enlargement of the partially visualized left parotid gland, indeterminate. Correlation with physical exam for possible acute parotitis recommended. Electronically Signed   By: Rise MuBenjamin  McClintock M.D.   On: 12/27/2016 23:44   Ir Fluoro Guide Cv Line Right  Result Date: 12/25/2016 INDICATION: Renal failure EXAM: IR RIGHT FLOURO GUIDE CV LINE; IR ULTRASOUND GUIDANCE VASC ACCESS RIGHT MEDICATIONS: None. ANESTHESIA/SEDATION: None. FLUOROSCOPY TIME:  Fluoroscopy Time: 4 minutes 30 seconds (26 mGy). COMPLICATIONS: None immediate. PROCEDURE: Informed written consent was obtained from the patient after a thorough discussion of the procedural risks, benefits and alternatives. All questions were addressed. Maximal Sterile Barrier Technique was utilized including caps, mask, sterile gowns, sterile gloves, sterile drape, hand hygiene and skin antiseptic. A timeout was performed prior to the initiation of the procedure. The right neck was prepped and draped in a sterile fashion. 1% lidocaine  was utilized for local anesthesia. Under sonographic guidance, a micropuncture needle was inserted into the right jugular vein and up sized to an Amplatz wire. The dilator followed by the temporary dialysis catheter were advanced over the wire to the cavoatrial junction. It was flushed and sewn in place with an 0 Prolene knot. FINDINGS: A temporary right jugular dialysis catheter has been placed with its tip at the cavoatrial junction. IMPRESSION: Successful temporary right jugular dialysis catheter placement with its tip at the cavoatrial junction. Electronically Signed   By: Jolaine ClickArthur  Hoss M.D.   On: 12/25/2016 16:40   Ir [place]as Guide Vasc Access Right  Result Date: 12/25/2016 INDICATION: Renal failure EXAM: IR RIGHT FLOURO GUIDE CV LINE; IR ULTRASOUND GUIDANCE VASC ACCESS RIGHT MEDICATIONS: None. ANESTHESIA/SEDATION: None. FLUOROSCOPY TIME:  Fluoroscopy Time: 4 minutes 30 seconds (26 mGy). COMPLICATIONS: None immediate. PROCEDURE: Informed written consent was obtained from the patient after a thorough discussion of the procedural risks, benefits and alternatives. All questions were addressed. Maximal Sterile Barrier Technique was utilized including caps, mask, sterile gowns, sterile gloves, sterile drape, hand hygiene and skin antiseptic. A timeout was performed prior to the initiation of the procedure. The right neck was prepped and draped in a sterile fashion. 1% lidocaine was utilized for local anesthesia. Under sonographic guidance, a micropuncture needle was inserted into the right jugular vein and up sized to an Amplatz wire. The dilator followed by the temporary dialysis catheter were advanced over the wire to the cavoatrial junction. It was flushed and sewn in place with an 0 Prolene knot. FINDINGS: A temporary right jugular dialysis catheter has been placed with its tip at the cavoatrial junction. IMPRESSION: Successful temporary right jugular dialysis catheter placement with its tip at the cavoatrial  junction. Electronically Signed   By: Jolaine ClickArthur  Hoss M.D.   On: 12/25/2016 16:40   Dg Chest Port 1 View  Result Date: 12/19/2016 CLINICAL  DATA:  Destony Prevost termination of dialysis diminished lung sounds EXAM: PORTABLE CHEST 1 VIEW COMPARISON:  10/28/2016 FINDINGS: Post sternotomy changes and valvular prosthesis. Cardiomegaly is present. Central vascular congestion without overt edema. Possible small left pleural effusion. Atherosclerosis. No pneumothorax. IMPRESSION: Cardiomegaly with central vascular congestion. Possible small left effusion. No overt pulmonary edema. Electronically Signed   By: Jasmine Pang M.D.   On: 12/19/2016 23:59   Anti-infectives: Anti-infectives    Start     Dose/Rate Route Frequency Ordered Stop   2017-01-06 1215  ceFAZolin (ANCEF) 2-4 GM/100ML-% IVPB    Comments:  Scronce, Trina   : cabinet override      2017-01-06 1215 12/24/16 0029   01-06-2017 0600  ceFAZolin (ANCEF) IVPB 1 g/50 mL premix    Comments:  Send with pt to OR   1 g 100 mL/hr over 30 Minutes Intravenous To Surgery 12/22/16 0815 12/24/16 0600      Assessment/Plan: s/p Procedure(s): AORTOGRAM WITH BILATERAL LOWER EXTREMITY RUNOFF; LEFT POPLITEAL STENTING (N/A) Stable with the flow to her left foot. To be adequate. Very difficult due to the multiple different types of pain she is having. This can be left leg and then right leg. Currently she is denying any rest symptoms in her left foot. I discussed that her overall progress and prognosis with her son. 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 She is continue to have overall clinical deterioration throughout her hospitalization. Her left upper arm AV fistula is patent and she is using her temporary catheter in the right internal jugular vein to allow the hematoma in her fistula to resolve. Following from sidelines.   LOS: 10 days   Gretta Began 12/28/2016, 3:32  PM

## 2016-12-28 NOTE — Progress Notes (Signed)
Subjective: Interval History: has no complaint, but clearly pain in LLE with movement,.  Objective: Vital signs in last 24 hours: Temp:  [98 F (36.7 C)-99.5 F (37.5 C)] 98 F (36.7 C) (02/19 0500) Pulse Rate:  [53-69] 58 (02/19 0500) Resp:  [13-19] 13 (02/19 0500) BP: (132-170)/(35-45) 141/35 (02/19 0500) SpO2:  [92 %-100 %] 100 % (02/19 0500) Weight:  [72.7 kg (160 lb 4.8 oz)] 72.7 kg (160 lb 4.8 oz) (02/19 0500) Weight change: -0.288 kg (-10.2 oz)  Intake/Output from previous day: 02/18 0701 - 02/19 0700 In: 600 [P.O.:600] Out: 1 [Stool:1] Intake/Output this shift: No intake/output data recorded.  General appearance: mildly obese, pale and minmally responsive to voice Neck: RIJ cath Resp: diminished breath sounds bilaterally and rales bibasilar Cardio: S1, S2 normal and systolic murmur: systolic ejection 3/6, decrescendo at 2nd left intercostal space GI: obese, pos bs, non tender Extremities: bruises both groins, R>L,  blistering of LLE below knee, both LE cool and L cold and bluish  Lab Results:  Recent Labs  12/27/16 0418 12/28/16 0431  WBC 10.7* 11.8*  HGB 8.4* 7.5*  HCT 25.6* 22.8*  PLT 323 326   BMET:  Recent Labs  12/27/16 0418 12/28/16 0431  NA 129* 127*  K 3.6 4.2  CL 94* 93*  CO2 26 25  GLUCOSE 107* 87  BUN 22* 40*  CREATININE 2.66* 3.63*  CALCIUM 7.8* 7.8*   No results for input(s): PTH in the last 72 hours. Iron Studies: No results for input(s): IRON, TIBC, TRANSFERRIN, FERRITIN in the last 72 hours.  Studies/Results: Mr Brain Wo Contrast  Result Date: 12/27/2016 CLINICAL DATA:  Initial evaluation for acute left-sided weakness, lethargy. EXAM: MRI HEAD WITHOUT CONTRAST TECHNIQUE: Multiplanar, multiecho pulse sequences of the brain and surrounding structures were obtained without intravenous contrast. COMPARISON:  None available. FINDINGS: Brain: Diffuse prominence of the CSF containing spaces is compatible with generalized age-related  cerebral atrophy. Patchy and confluent T2/FLAIR hyperintensity within the periventricular and deep white matter both cerebral hemispheres most compatible with chronic small vessel ischemic disease, moderate nature. Chronic microvascular ischemic changes present within the pons as well. Superimposed remote lacunar infarcts present within the bilateral basal ganglia and pons. There is a remote cortical/ subcortical infarct within the posterior right frontal parietal region near the vertex. Focal encephalomalacia at the anterior right temporal pole may be related to remote ischemia or possibly trauma. There is a punctate 5 mm focus of restricted diffusion within the peripheral aspect of the inferior left cerebellar hemisphere (series 4, image 5). Consistent with a tiny acute ischemic infarct. No associated hemorrhage or mass effect. No other evidence for acute or subacute ischemia. Gray-white matter differentiation otherwise maintained. Few scattered foci of susceptibility artifact present within the bilateral cerebral hemispheres, with additional prominent focus within the right cerebellar hemisphere. Findings most consistent with small chronic micro hemorrhages, most likely hypertensive in nature. No mass lesion, midline shift or mass effect. No hydrocephalus. No extra-axial fluid collection. Major dural sinuses are grossly patent. 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 Vascular: Abnormal flow void within the distal left vertebral artery, which may be related to slow flow and/ or occlusion. Major intracranial vascular flow voids otherwise maintained. Skull and upper cervical spine: Degenerative thickening of the tectorial membrane noted without significant stenosis. Remainder the visualized upper cervical spine otherwise unremarkable. Craniocervical junction otherwise unremarkable. Bone marrow signal intensity within normal limits. No scalp soft tissue abnormality. Sinuses/Orbits: Globes and orbital soft tissues  within normal limits. Mild scattered  mucosal thickening within the ethmoidal air cells and left sphenoid sinus. Paranasal sinuses are otherwise clear. Partially visualized left parotid gland is larger in size as compared to the partially visualized right, of uncertain significance. Additionally, there is mildly increased associated diffusion signal is compared to the contralateral right. Question acute parotitis. IMPRESSION: 1. Punctate 5 mm acute ischemic infarct within the peripheral aspect of the inferior left cerebellar hemisphere. No associated hemorrhage or mass effect. 2. Generalized cerebral atrophy with moderate chronic microvascular ischemic disease and multiple remote infarcts as detailed above. 3. Few scattered foci of susceptibility artifact involving the supratentorial brain and right cerebellar hemisphere, consistent with small chronic micro hemorrhages. These are most commonly related to chronic underlying hypertension. 4. Apparent asymmetric enlargement of the partially visualized left parotid gland, indeterminate. Correlation with physical exam for possible acute parotitis recommended. Electronically Signed   By: Rise MuBenjamin  McClintock M.D.   On: 12/27/2016 23:44    I have reviewed the patient's current medications.  Assessment/Plan: 1 ESRD for HD TTS . Prob with LLE ischemic pain on HD independent of BP 2 Anemia ^ESa 3 HPth vit D 4 ?? New CVA,small 5 Lethargy ? Related to #4 6 LLE ischemia critical, need to consider amp vs, stopping life sustaining tx 7 Severe PVD P HD TTS, ?? Amp vs comfort care, ^ esa    LOS: 10 days   Tanay Misuraca L 12/28/2016,7:22 AM

## 2016-12-28 NOTE — Progress Notes (Addendum)
RN came into patient's room to assist patient in the bed. Patient weakly coughing when RN walked into patient's room. Patient's family stated "she is coughing with every bite of food."  RN notified speech therapy of new findings and Leah, with speech therapy, stated that a speech therapist would be by to assess the patient.    1500 RN notified Dr Elisabeth Pigeonevine that patient has been complaining of right hip pain today and currently tearful, complaining "why does my hip hurt so bad?" Dr Elisabeth Pigeonevine gave verbal orders for XR Right hip. Patient's son at bedside and notified of this.

## 2016-12-28 NOTE — Clinical Social Work Note (Signed)
Please note that the patient's son/HPOA [name] remains ins[facility]. The facility states that they are able to manage the patient's needs. [facility].   Roddie McBryant Laporsha Grealish MSW, [place], [place], [phone]

## 2016-12-28 NOTE — Progress Notes (Signed)
Patient was sleepy on change of shift. Difficult to arouse. While performing assessment, her daughter-in-law called & stated that she noticed a change in her mother-in-law when she was here during the day. She believed that her mother-in-law had a stroke. I informed her that I was assessing her. Patient seemed neuro intact except slight left sided weakness (has HX of stroke). Dr Selena BattenKim notified & MRI was ordered. After MRI resulted, texted Dr Selena BattenKim to review the MRI result. Small stroke was confirmed. He also confirmed that patient is on Brelinta & ASA. Neurologist to F/U in AM as well as Carotid U/S.

## 2016-12-28 NOTE — Care Management Important Message (Signed)
Important Message  Patient Details  Name: Jill Johnson MRN: [id number] Date of Birth: 1940/11/24   Medicare Important Message Given:  Yes    Kyla BalzarineShealy, Perle Brickhouse Abena 12/28/2016, 12:17 PM

## 2016-12-29 ENCOUNTER — Encounter: Payer: Commercial Managed Care - HMO | Admitting: Vascular Surgery

## 2016-12-29 ENCOUNTER — Inpatient Hospital Stay (HOSPITAL_COMMUNITY): Payer: Medicare HMO

## 2016-12-29 DIAGNOSIS — I6330 Cerebral infarction due to thrombosis of unspecified cerebral artery: Secondary | ICD-10-CM | POA: Insufficient documentation

## 2016-12-29 DIAGNOSIS — Z515 Encounter for palliative care: Secondary | ICD-10-CM

## 2016-12-29 LAB — COMPREHENSIVE METABOLIC PANEL
ALT: 7 U/L — ABNORMAL LOW (ref 14–54)
AST: 32 U/L (ref 15–41)
Albumin: 1.6 g/dL — ABNORMAL LOW (ref 3.5–5.0)
Alkaline Phosphatase: 102 U/L (ref 38–126)
Anion gap: 11 (ref 5–15)
BILIRUBIN TOTAL: 1.1 mg/dL (ref 0.3–1.2)
BUN: 70 mg/dL — ABNORMAL HIGH (ref 6–20)
CO2: 24 mmol/L (ref 22–32)
Calcium: 8 mg/dL — ABNORMAL LOW (ref 8.9–10.3)
Chloride: 92 mmol/L — ABNORMAL LOW (ref 101–111)
Creatinine, Ser: 4.66 mg/dL — ABNORMAL HIGH (ref 0.44–1.00)
GFR calc Af Amer: 10 mL/min — ABNORMAL LOW (ref >60)
GFR calc non Af Amer: 8 mL/min — ABNORMAL LOW (ref >60)
GLUCOSE: 85 mg/dL (ref 65–99)
POTASSIUM: 4.5 mmol/L (ref 3.5–5.1)
SODIUM: 127 mmol/L — AB (ref 135–145)
TOTAL PROTEIN: 4.4 g/dL — AB (ref 6.5–8.1)

## 2016-12-29 LAB — CBC
HCT: 21.9 % — ABNORMAL LOW (ref 36.0–46.0)
HEMOGLOBIN: 7.2 g/dL — AB (ref 12.0–15.0)
MCH: 32.9 pg (ref 26.0–34.0)
MCHC: 32.9 g/dL (ref 30.0–36.0)
MCV: 100 fL (ref 78.0–100.0)
Platelets: 337 10*3/uL (ref 150–400)
RBC: 2.19 MIL/uL — AB (ref 3.87–5.11)
RDW: 19.6 % — ABNORMAL HIGH (ref 11.5–15.5)
WBC: 12.3 10*3/uL — ABNORMAL HIGH (ref 4.0–10.5)

## 2016-12-29 LAB — LIPID PANEL
CHOL/HDL RATIO: 5.2 ratio
CHOLESTEROL: 67 mg/dL (ref 0–200)
HDL: 13 mg/dL — ABNORMAL LOW (ref >40)
LDL Cholesterol: 27 mg/dL (ref 0–99)
Triglycerides: 136 mg/dL (ref <150)
VLDL: 27 mg/dL (ref 0–40)

## 2016-12-29 LAB — PHOSPHORUS: Phosphorus: 3.8 mg/dL (ref 2.5–4.6)

## 2016-12-29 LAB — IRON AND TIBC
Iron: 75 ug/dL (ref 28–170)
SATURATION RATIOS: 65 % — AB (ref 10.4–31.8)
TIBC: 115 ug/dL — AB (ref 250–450)
UIBC: 40 ug/dL

## 2016-12-29 MED ORDER — LIDOCAINE HCL 2 % EX GEL
1.0000 "application " | Freq: Three times a day (TID) | CUTANEOUS | Status: DC
  Administered 2016-12-29 – 2017-01-02 (×13): 1 via TOPICAL
  Filled 2016-12-29: qty 30
  Filled 2016-12-29 (×9): qty 5

## 2016-12-29 MED ORDER — ALPRAZOLAM 0.5 MG PO TABS
ORAL_TABLET | ORAL | Status: AC
  Filled 2016-12-29: qty 1

## 2016-12-29 MED ORDER — SENNA 8.6 MG PO TABS
1.0000 | ORAL_TABLET | Freq: Two times a day (BID) | ORAL | Status: DC
  Administered 2016-12-29 – 2017-01-01 (×7): 8.6 mg via ORAL
  Filled 2016-12-29 (×8): qty 1

## 2016-12-29 MED ORDER — WHITE PETROLATUM GEL
Status: AC
  Filled 2016-12-29: qty 1

## 2016-12-29 MED ORDER — DICLOFENAC SODIUM 1 % TD GEL
2.0000 g | Freq: Three times a day (TID) | TRANSDERMAL | Status: DC
Start: 2016-12-29 — End: 2017-01-01
  Administered 2016-12-29 – 2017-01-01 (×10): 2 g via TOPICAL
  Filled 2016-12-29: qty 100

## 2016-12-29 MED ORDER — POLYETHYLENE GLYCOL 3350 17 G PO PACK
17.0000 g | PACK | Freq: Two times a day (BID) | ORAL | Status: AC
  Administered 2016-12-29 – 2016-12-31 (×3): 17 g via ORAL
  Filled 2016-12-29 (×3): qty 1

## 2016-12-29 MED ORDER — ALPRAZOLAM 0.25 MG PO TABS
0.2500 mg | ORAL_TABLET | ORAL | Status: AC
  Administered 2016-12-29: 0.25 mg via ORAL
  Filled 2016-12-29: qty 1

## 2016-12-29 MED ORDER — GLYCERIN (LAXATIVE) 2.1 G RE SUPP
1.0000 | Freq: Once | RECTAL | Status: AC
  Administered 2016-12-29: 1 via RECTAL
  Filled 2016-12-29: qty 1

## 2016-12-29 NOTE — Progress Notes (Addendum)
Daily Progress Note   Patient Name: Jill Johnson       Date: 12/29/2016 DOB: February 05, 1941  Age: 76 y.o. MRN#: [id number] Attending Physician: Alison Murray, MD Primary Care Physician: Hollice Espy, MD Admit Date: 12/11/2016  Reason for Consultation/Follow-up: Establishing goals of care  Subjective:  Patient evaluated. RN at bedside doing wound care. Patient is anxious, agitated. Calling out "sweet jesus". She denies pain and declines changes in pain medication. Noted concerns re: mental status changes with oxycodone.  Had stroke workup. Remeron does not appear to have improved her state- will d/c. Reviewed vascular note indicating need for decision re: amputation vs stopping HD. Meeting arranged with Augusto Gamble and family for tomorrow.   Length of Stay: 11  Current Medications: Scheduled Meds:  . acetaminophen  1,000 mg Oral TID  . ALPRAZolam      . aspirin  81 mg Oral Daily  . atorvastatin  80 mg Oral q1800  . carvedilol  3.125 mg Oral BID WC  . [START ON 12/31/2016] darbepoetin (ARANESP) injection - DIALYSIS  200 mcg Intravenous Q Thu-HD  . diclofenac sodium  2 g Topical TID  . docusate sodium  100 mg Oral BID  . feeding supplement (PRO-STAT SUGAR FREE 64)  30 mL Oral BID  . gabapentin  200 mg Oral BID  . heparin  5,000 Units Subcutaneous Q8H  . levothyroxine  112 mcg Oral QAC breakfast  . lidocaine  1 application Topical TID  . multivitamin  1 tablet Oral QHS  . polyethylene glycol  17 g Oral BID  . senna  1 tablet Oral BID  . sodium chloride flush  3 mL Intravenous Q12H  . sucroferric oxyhydroxide  1,000 mg Oral TID WC  . ticagrelor  90 mg Oral BID  . vitamin B-12  1,500 mcg Oral Daily  . white petrolatum        Continuous Infusions: . sodium chloride 10 mL/hr at 01/04/2017 1235     PRN Meds: sodium chloride, acetaminophen, ALPRAZolam, dextromethorphan-guaiFENesin, gi cocktail, lidocaine, nitroGLYCERIN, ondansetron (ZOFRAN) IV, senna-docusate, sodium chloride flush, traMADol  Physical Exam  Constitutional: She is oriented to person, place, and time. She appears well-developed and well-nourished.  HENT:  Head: Normocephalic and atraumatic.  Cardiovascular: Normal rate and regular rhythm.   Pulmonary/Chest: Effort normal and breath sounds normal.  Neurological: She is alert and oriented to person, place, and time.  Skin: Skin is warm and dry.  Multiple gangrenous toes on L foot, blister to L calf, foot red/yellow, appears ischemic  Psychiatric:  agitated  Nursing note and vitals reviewed.           Vital Signs: BP 118/62   Pulse 71   Temp 97.4 F (36.3 C) (Oral)   Resp 20   Ht 5' (1.524 m)   Wt 71.6 kg (157 lb 13.6 oz)   SpO2 95%   BMI 30.83 kg/m  SpO2: SpO2: 95 % O2 Device: O2 Device: Not Delivered O2 Flow Rate: O2 Flow Rate (L/min): 3 L/min  Intake/output summary:   Intake/Output Summary (Last 24 hours) at 12/29/16 1538 Last data filed at 12/29/16 1335  Gross per 24 hour  Intake              240 ml  Output             2000 ml  Net            -1760 ml   LBM: Last BM Date: 12/27/16 Baseline Weight: Weight: 71.7 kg (158 lb) Most recent weight: Weight: 71.6 kg (157 lb 13.6 oz)       Palliative Assessment/Data: PPS: 40%    Flowsheet Rows   Flowsheet Row Most Recent Value  Intake Tab  Referral Department  Hospitalist  Unit at Time of Referral  Cardiac/Telemetry Unit  Palliative Care Primary Diagnosis  Cardiac  Date Notified  12/22/16  Palliative Care Type  New Palliative care  Reason for referral  Clarify Goals of Care  Date of Admission  12/15/2016  # of days IP prior to Palliative referral  4  Clinical Assessment  Psychosocial & Spiritual Assessment  Palliative Care Outcomes      Patient Active Problem List   Diagnosis Date  Noted  . Palliative care encounter   . Other specified hypothyroidism   . Goals of care, counseling/discussion   . Advance care planning   . Unstable angina (HCC) 12/29/2016  . Anemia, chronic disease 12/15/2016  . ESRD on hemodialysis (HCC) 12/22/2016  . PAD (peripheral artery disease) (HCC) 12/16/2016  . Critical lower limb ischemia 12/16/2016  . Coronary artery disease involving native coronary artery of native heart with unstable angina pectoris (HCC) 12/25/2016  . Chest pain 10/28/2016  . Fluid overload 10/28/2016  . Elevated troponin 10/28/2016  . Symptomatic anemia 10/28/2016  . ESRD (end stage renal disease) (HCC) 10/28/2016  . Hypothyroidism 10/28/2016  . Respiratory distress 10/28/2016  . Acute diastolic heart failure (HCC) 12/20/2015  . Obesity 12/20/2015  . Coronary artery disease due to lipid rich plaque 12/20/2015  . S/P AVR (aortic valve replacement) 12/20/2015  . Chronic kidney disease, stage 3 12/20/2015    Palliative Care Assessment & Plan   Patient Profile: 76 y.o. female  with past medical history of ESRD (on HD), HLD, HTN, CAD s/p CABG (2011), TIA, CVA, critical limb ischemia admitted on 12/22/2016 with chest pain. She had presented for scheduled lower limb angiography, however, before the procedure she was found to have chest pain and EKG revealed ST depression. She is now s/p cardiac cath with stent placement. Had popliteal stent placed. Palliative medicine consulted for GOC.   Recommendations/Plan: - Pain/agitation- she denied pain but appears agitated, one time extra dose of alprazolam ordered, will d/c mirtazapine as has not improved pt status and may be contributing to mental status changes, topical lidocaine gel  to lower extremity, dicloflenac gel to hip - Constipation- was manually disimpacted by RN- noted to have hard stool. Start Miralax 17gm BID x 3 days, then 17gm daily; increase Senna 1 tab po bid, glycerin suppository -Family meeting tomorrow for GOC,  son Augusto GambleJody to call me with a time  Code Status:    Code Status Orders        Start     Ordered   12/28/2016 1045  Do not attempt resuscitation (DNR)  Continuous    Question Answer Comment  In the event of cardiac or respiratory ARREST Do not call a "code blue"   In the event of cardiac or respiratory ARREST Do not perform Intubation, CPR, defibrillation or ACLS   In the event of cardiac or respiratory ARREST Use medication by any route, position, wound care, and other measures to relive pain and suffering. May use oxygen, suction and manual treatment of airway obstruction as needed for comfort.      01/01/2017 1044    Code Status History    Date Active Date Inactive Code Status Order ID Comments User Context   12/17/2016  8:36 PM 01/04/2017 10:44 AM Full Code [id number]  Tonny BollmanMichael Cooper, MD Inpatient   12/25/2016  8:34 PM 01/04/2017  8:36 PM Full Code [id number]  Darrol Jumphonda G Barrett, PA-C Inpatient   10/28/2016  8:28 PM 11/07/2016  7:19 PM Full Code [id number]  Clydie Braunondell A Smith, MD Inpatient    Advance Directive Documentation   Flowsheet Row Most Recent Value  Type of Advance Directive  Healthcare Power of Attorney  Pre-existing out of facility DNR order (yellow form or pink MOST form)  No data  "MOST" Form in Place?  No data       Prognosis:   Unable to determine  Discharge Planning:  Skilled Nursing Facility for rehab with Palliative care service follow-up vs ALF  Care plan was discussed with patient, bedside RN  Thank you for allowing the Palliative Medicine Team to assist in the care of this patient.   Total Time 30 minutes Prolonged Time Billed  no       Greater than 50%  of this time was spent counseling and coordinating care related to the above assessment and plan.  Trinidad CuretMahan, Karlis Cregg Jean, NP  Please contact Palliative Medicine Team phone at [phone] for questions and concerns.

## 2016-12-29 NOTE — Progress Notes (Signed)
Pt very anxious and tearful, uncomfortable with any movement, has been seen by palliative NP with new orders noted and implemented, offering pt assistance with movement in bed and positioning with pillows, will continue to monitor closely.  Raymon MuttonGwen Tiron Suski RN

## 2016-12-29 NOTE — Care Management Note (Signed)
Case Management Note  Patient Details  Name: Jill Johnson MRN: [id number] Date of Birth: 1941/04/01  Subjective/Objective:  Pt presented for Left popliteal stenting for critical limb ischemia. Nstemi post cath with severe vessel CAD- s/p stents. Pt with increasing weakness and MRI positive for acute stroke.                Action/Plan: CSW is following for plan of care. Pt's family wants [facility]. CM will continue to monitor for additional needs.   Expected Discharge Date:                  Expected Discharge Plan:  Assisted Living / Rest Home  In-House Referral:  Clinical Social Work  Discharge planning Services  CM Consult  Post Acute Care Choice:  NA Choice offered to:  NA  DME Arranged:  N/A DME Agency:  NA  HH Arranged:  NA HH Agency:  NA  Status of Service:  Completed, signed off  If discussed at Long Length of Stay Meetings, dates discussed:  12-29-16  Additional Comments:  Gala LewandowskyGraves-Bigelow, Maedell Hedger Kaye, RN 12/29/2016, 4:48 PM

## 2016-12-29 NOTE — Progress Notes (Signed)
Subjective: Interval History: has complaints wants to get up in chair.  Objective: Vital signs in last 24 hours: Temp:  [98.6 F (37 C)-98.9 F (37.2 C)] 98.9 F (37.2 C) (02/20 0500) Pulse Rate:  [49-69] 57 (02/20 0500) Resp:  [11-18] 13 (02/20 0500) BP: (125-149)/(32-95) 140/38 (02/20 0500) SpO2:  [97 %-100 %] 98 % (02/20 0500) FiO2 (%):  [32 %] 32 % (02/19 1122) Weight:  [72.4 kg (159 lb 9.6 oz)] 72.4 kg (159 lb 9.6 oz) (02/20 0500) Weight change: -0.318 kg (-11.2 oz)  Intake/Output from previous day: 02/19 0701 - 02/20 0700 In: 240 [P.O.:240] Out: -  Intake/Output this shift: No intake/output data recorded.  General appearance: moderately obese, pale and confused, aggitated Resp: diminished breath sounds bilaterally Breasts: not eval GI: obese, pos bs, soft, liver down 5 cm Extremities: AVF LUA with bruise, infilt, RIJ temp cath, R leg cool,L cooler, blisters on calf, gangrenous toes on foot  CV reg Gr 3/6 SEM ULSB  Lab Results:  Recent Labs  12/28/16 0431 12/29/16 0658  WBC 11.8* 12.3*  HGB 7.5* 7.2*  HCT 22.8* 21.9*  PLT 326 337   BMET:  Recent Labs  12/27/16 0418 12/28/16 0431  NA 129* 127*  K 3.6 4.2  CL 94* 93*  CO2 26 25  GLUCOSE 107* 87  BUN 22* 40*  CREATININE 2.66* 3.63*  CALCIUM 7.8* 7.8*   No results for input(s): PTH in the last 72 hours. Iron Studies: No results for input(s): IRON, TIBC, TRANSFERRIN, FERRITIN in the last 72 hours.  Studies/Results: Mr Brain Wo Contrast  Result Date: 12/27/2016 CLINICAL DATA:  Initial evaluation for acute left-sided weakness, lethargy. EXAM: MRI HEAD WITHOUT CONTRAST TECHNIQUE: Multiplanar, multiecho pulse sequences of the brain and surrounding structures were obtained without intravenous contrast. COMPARISON:  None available. FINDINGS: Brain: Diffuse prominence of the CSF containing spaces is compatible with generalized age-related cerebral atrophy. Patchy and confluent T2/FLAIR hyperintensity within the  periventricular and deep white matter both cerebral hemispheres most compatible with chronic small vessel ischemic disease, moderate nature. Chronic microvascular ischemic changes present within the pons as well. Superimposed remote lacunar infarcts present within the bilateral basal ganglia and pons. There is a remote cortical/ subcortical infarct within the posterior right frontal parietal region near the vertex. Focal encephalomalacia at the anterior right temporal pole may be related to remote ischemia or possibly trauma. There is a punctate 5 mm focus of restricted diffusion within the peripheral aspect of the inferior left cerebellar hemisphere (series 4, image 5). Consistent with a tiny acute ischemic infarct. No associated hemorrhage or mass effect. No other evidence for acute or subacute ischemia. Gray-white matter differentiation otherwise maintained. Few scattered foci of susceptibility artifact present within the bilateral cerebral hemispheres, with additional prominent focus within the right cerebellar hemisphere. Findings most consistent with small chronic micro hemorrhages, most likely hypertensive in nature. No mass lesion, midline shift or mass effect. No hydrocephalus. No extra-axial fluid collection. Major dural sinuses are grossly patent. 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 Vascular: Abnormal flow void within the distal left vertebral artery, which may be related to slow flow and/ or occlusion. Major intracranial vascular flow voids otherwise maintained. Skull and upper cervical spine: Degenerative thickening of the tectorial membrane noted without significant stenosis. Remainder the visualized upper cervical spine otherwise unremarkable. Craniocervical junction otherwise unremarkable. Bone marrow signal intensity within normal limits. No scalp soft tissue abnormality. Sinuses/Orbits: Globes and orbital soft tissues within normal limits. Mild scattered mucosal thickening within the  ethmoidal  air cells and left sphenoid sinus. Paranasal sinuses are otherwise clear. Partially visualized left parotid gland is larger in size as compared to the partially visualized right, of uncertain significance. Additionally, there is mildly increased associated diffusion signal is compared to the contralateral right. Question acute parotitis. IMPRESSION: 1. Punctate 5 mm acute ischemic infarct within the peripheral aspect of the inferior left cerebellar hemisphere. No associated hemorrhage or mass effect. 2. Generalized cerebral atrophy with moderate chronic microvascular ischemic disease and multiple remote infarcts as detailed above. 3. Few scattered foci of susceptibility artifact involving the supratentorial brain and right cerebellar hemisphere, consistent with small chronic micro hemorrhages. These are most commonly related to chronic underlying hypertension. 4. Apparent asymmetric enlargement of the partially visualized left parotid gland, indeterminate. Correlation with physical exam for possible acute parotitis recommended. Electronically Signed   By: Rise Mu M.D.   On: 12/27/2016 23:44   Dg Hip Unilat With Pelvis 2-3 Views Right  Result Date: 12/28/2016 CLINICAL DATA:  Generalized right hip pain.  Bruising. EXAM: DG HIP (WITH OR WITHOUT PELVIS) 2-3V RIGHT COMPARISON:  None. FINDINGS: Dense vascular calcifications are identified. Osteopenia. No hip fracture or dislocation. No bony lesions. Mild degenerative changes in the right hip. A true lateral view was not obtained. IMPRESSION: No fracture or dislocation.  Mild degenerative changes. Electronically Signed   By: Gerome Sam III M.D   On: 12/28/2016 17:45    I have reviewed the patient's current medications.  Assessment/Plan: 1 ESRD for HD, will see if tolerates. If not need to make dispo either amp, or stop  2 PVD ischemic L leg 3 Confusion meds, ? Small stroke playing some role , doubt CVA issue 4 CVA 5 Anemia 6 DM  controlled 7 HPTH P HD, mobilize, see how does with L leg, cont esa    LOS: 11 days   Dossie Ocanas L 12/29/2016,7:29 AM

## 2016-12-29 NOTE — Progress Notes (Signed)
SLP Cancellation Note  Patient Details Name: Jill Johnson MRN: [id number] DOB: 1941/06/28   Cancelled treatment:       Reason Eval/Treat Not Completed: Patient at procedure or test/unavailable   Analys Ryden 12/29/2016, 1:52 PM

## 2016-12-29 NOTE — Progress Notes (Signed)
OT Cancellation Note  Patient Details Name: Jill Johnson L Cocking MRN: [id number] DOB: Sep 18, 1941   Cancelled Treatment:    Reason Eval/Treat Not Completed: Patient at procedure or test/ unavailable (currently in HD). Will follow up for OT eval as time allows.  Gaye AlkenBailey A Sakinah Rosamond M.S., OTR/L Pager: [phone]  12/29/2016, 10:04 AM

## 2016-12-29 NOTE — Progress Notes (Signed)
Stroke Neurology Note 75-yo woman with numerous significant chronic medical problems now s/p L popliteal stenting for critical limb ischemia. Hospital course further complicated by NSTEMI. She was noted to have some possible weakness of the LUE. This prompted MRI brain which showed a punctate area of restricted diffusion in the L cerebellum  Subjective ; patient is presently in dialysis. She states she is quite tired and wants to be left alone. She denies focal extremity weakness and numbness. She does have peripheral neuropathy and some baseline gait difficulties.  Objective Vitals:   12/29/16 1310 12/29/16 1335  BP: (!) 110/35 118/62  Pulse: (!) 59 71  Resp: 19 20  Temp:  97.4 F (36.3 C)  middle aged Caucasian lady.getting hemodialysis. Distal pulses not felt in both lefts. Changes of  Erythema and discoloration in toes left foot. . Afebrile. Head is nontraumatic. Neck is supple without bruit.    Cardiac exam no murmur or gallop. Lungs are clear to auscultation. Distal pulses are well felt. Neurological Exam ;  Drowsy but arouses easily. Oriented 3. Speech and language appear normal. Fundi were not visualized. Eye movements are full range without nystagmus. Blinks to threat bilaterally. Not cooperative for visual field testing. Face is symmetric. Tongue is midline. Motor system exam patient's cooperation is quite limited but she is able to move all 4 extremities against gravity but will not cooperate for detailed exam. I see no focal weakness. Dependent reflexes are present in the upper extremities and depressed in the lower eczema to his. Plantars are downgoing. Diminished touch pinprick sensation in both lower extremity from in stocking distribution.  MRI Brain personally reviewed shows tiny punctate 2 mm peripheral inferior cerebellar cortical hyperdensity which is not dark and corresponding ADC map and is of questionable significance possible artifact Transthoracic echo and carotid  Dopplers are both pending LDL 27 mg percent. Hemoglobin A1c is pending.  IMPRESSION : 76 year old lady  Stroke like symptoms of subjective left-sided weakness with neurological exam showing some nonorganic weakness of giveaway pattern-doubt stroke. MRI scan shows tiny superficial lefts inferior cerebellar cortical diffusion hyperintensity which is not dark on corresponding ADC map and is of questionable significance and anyway does not clinically explain possible left-sided weakness. This finding at most may represent a tiny incidental lacunar infarct or an artifact and is not responsible for patient's present clinical symptom. Recommendation continue aspirin for stroke prevention and follow pending test results but I do not believe further aggressive stroke workup is necessary. Medical management is as per primary team. Greater than 50% time during this 25 minute visit was spent on counseling and coordination of care about her abnormal MRI findings of her subjective weakness and answering questions. Delia HeadyPramod Vee Bahe, MD Medical Director [facility] Pager: [phone] 12/29/2016 2:44 PM

## 2016-12-29 NOTE — Progress Notes (Addendum)
Patient ID: Jill Johnson, female   DOB: 03-03-1941, 76 y.o.   MRN: [id number]  PROGRESS NOTE    SHANAVIA MAKELA  FFM:[id number] DOB: [birth year] DOA: 01/04/2017  PCP: Marjorie Smolder, MD   Brief Narrative:  76 year old female with past medical history significant for end-stage renal disease on hemodialysis (TTS) started about 2 months ago, CAD status post CABG and AVR (AV replacement), hypothyroidism, hypertension, recent critical limb ischemia. Initially under cardiology primary care and [facility] consulted for evaluation of delirium. Please note patient under [facility] primary care starting 12/22/2016.  Patient has had left leg/foot pain for past month or so prior to this admission. She saw Dr. early in clinic and the plan was for outpatient angiography. In short stay, prior to the procedure, patient started to have chest pain and EKG showed new ST depressions. Patient was taken to cath lab on 12/28/2016. She is status post cardiac catheterization with SVG to PDA DES stent placement.  Pt also underwent aortogram with bilateral LE runoff, left popliteal stenting on 12/15/2016.   Pt had problems with infiltration. Per renal, it would be optimal to rest the fistula- ideally should have TDC placement but likely not possible in the setting of DAPT requirement. PCT has met with pt who expressed desire to continue HD. RIJV HD catheter placed 2/16.  More left lower extremity weakness on 2/18 reported by family. MRI brain done and found to have acute infarct in left cerebellar hemisphere. Seen by stroke team.   Assessment & Plan:  End-stage renal disease with hemodialysis -  Per renal, need to rest the fistula- ideally should have TDC placement but likely not possible in the setting of DAPT requirement. PCT has met with pt who expressed desire to continue HD - RIJV HD cath placed 2/16 per IR - HD per renal schedule  Acute ischemic infarct left cerebellar hemisphere - Stroke work up initiated -  Continue daily aspirin  - MRI brain - Punctate 5 mm acute ischemic infarct within the peripheral aspect of the inferior left cerebellar hemisphere. No associated hemorrhage or mass effect. - 2D ECHO - pending as of this am - HgbA1c - pending - Lipid panel - 27. LDL goal < 100. Patient on Lipitor 80 mg at bedtime  - Diet: renal diet  - Therapy: PT/OT - SNF recommended on discharge  - Appreciate neurology following - per stroke team no further aggressive stroke work up necessary   Non-ST elevation myocardial infarction  - SVG to PDA stent placement, DES.  - Dual antiplatelet therapy for at least 6 months per cardiology   - Continue with Brilinta for now, aspirin. Per cario, possibly change to plavix in future  - Per cardio, continue aggressive secondary prevention with asa and brilinta as well as atorvastatin, carvedilol  Peripheral vascular disease with critical limb ischemia - Per Dr. Donnetta Hutching pt is very poor surgical candidate - S/P Aortogram with bilateral LE runoff, left popliteal stenting 01/06/2017 by Dr. Sherren Mocha Early   Hyperlipidemia - Continue statin therapy   Bioprosthetic aortic valve replacement 2011  - Recent echocardiogram in December 2017 showed normal function  Coronary artery disease status post CABG 2011  - SVG to PDA stenting  Anemia of chronic kidney disease - Hgb 7.2 this am - Transfuse for hgb less than 7  Hyponatremia - Sodium 127 this am - Renal following, appreciate their input   Delirium - Stable, intermittent confusion   Hypothyroidism  - Continue synthroid   Bilateral LEs and left foot  toes pressure injury  - Left medial LE:  3.5 x 2 with depth obscured by the presence of eschar, no exudate - Left lateral LE:  3cm x 1cm x 0.1cm partial thickness with pink, moist base and scant serous exudate - Right LE (anterior):  2.5cm x 1.5cm x 0.2cm full thickness wound with light yellow, moist wound bed and small amount of serous exudate - Dressing  procedure/placement/frequency: Conservative wound care using betadine swab sticks to the left toes with eschar in an effort to keep them dry and infection free.  Prevalon Boots are provided bilaterally to protect from injury.  The LE wounds care twice daily with saline moistened gauze and topped with protective silicone foam.     DVT prophylaxis: Asa, brilinta  Code Status: DNR/DNI Family Communication: no family at the bedside this am; updated son over the phone 2/19 Disposition Plan: plan to D/C to SNF once cleared by renal    Consultants:   Cardiology   Nephrology   Vascular surgery, Dr. Sherren Mocha Early   Neurology - consulted 2/18 for acute CVA  Palliative care   WOC  Procedures:   ESDR on HD TTS  Aortogram with bilateral LE runoff, left popliteal stenting - 12/29/2016  RIJV HF catheter placed 2/16  Antimicrobials:   None    Subjective: No overnight events.   Objective: Vitals:   12/29/16 1230 12/29/16 1300 12/29/16 1310 12/29/16 1335  BP: (!) 92/34 (!) 78/35 (!) 110/35 118/62  Pulse: 61 61 (!) 59 71  Resp: '20 18 19 20  '$ Temp:    97.4 F (36.3 C)  TempSrc:    Oral  SpO2:    95%  Weight:    71.6 kg (157 lb 13.6 oz)  Height:        Intake/Output Summary (Last 24 hours) at 12/29/16 1611 Last data filed at 12/29/16 1335  Gross per 24 hour  Intake              240 ml  Output             2000 ml  Net            -1760 ml   Filed Weights   12/29/16 0500 12/29/16 0915 12/29/16 1335  Weight: 72.4 kg (159 lb 9.6 oz) 73.8 kg (162 lb 11.2 oz) 71.6 kg (157 lb 13.6 oz)    Examination:  General exam: No acute distress, calm and comfortable  Respiratory system: Bilateral air entry, no wheezing  Cardiovascular system: S1 & S2 heard, Rate controlled; SEM appreciate 2/6 Gastrointestinal system: (+) BS, non tender, non distended  Central nervous system: good strength bilaterally, lifts LLE and RLE as well as both UE against resistance Extremities: No tenderness,  necrotic toes  Skin: LLE necrotic wounds on medical aspect, L toes necrotic; LUA AVF Psychiatry: no agitation, she is intermittently confused   Data Reviewed: I have personally reviewed following labs and imaging studies  CBC:  Recent Labs Lab 12/25/16 0305 12/26/16 0510 12/27/16 0418 12/28/16 0431 12/29/16 0658  WBC 9.3 7.8 10.7* 11.8* 12.3*  HGB 7.8* 8.0* 8.4* 7.5* 7.2*  HCT 23.8* 24.6* 25.6* 22.8* 21.9*  MCV 97.9 98.8 98.5 98.7 100.0  PLT 294 289 323 326 563   Basic Metabolic Panel:  Recent Labs Lab 12/24/16 0700 12/25/16 0305 12/26/16 0510 12/27/16 0418 12/28/16 0431 12/29/16 1013  NA 126* 129* 128* 129* 127* 127*  K 4.0 3.5 3.8 3.6 4.2 4.5  CL 91* 92* 91* 94* 93* 92*  CO2  $'24 28 27 26 25 24  'R$ GLUCOSE 79 96 95 107* 87 85  BUN 36* 24* 40* 22* 40* 70*  CREATININE 4.37* 3.89* 4.63* 2.66* 3.63* 4.66*  CALCIUM 7.8* 7.4* 7.8* 7.8* 7.8* 8.0*  PHOS 5.4*  --   --   --   --  3.8   GFR: Estimated Creatinine Clearance: 8.9 mL/min (by C-G formula based on SCr of 4.66 mg/dL (H)). Liver Function Tests:  Recent Labs Lab 12/24/16 0700 12/29/16 1013  AST  --  32  ALT  --  7*  ALKPHOS  --  102  BILITOT  --  1.1  PROT  --  4.4*  ALBUMIN 2.1* 1.6*   No results for input(s): LIPASE, AMYLASE in the last 168 hours. No results for input(s): AMMONIA in the last 168 hours. Coagulation Profile:  Recent Labs Lab 12/13/2016 0530  INR 1.17   Cardiac Enzymes: No results for input(s): CKTOTAL, CKMB, CKMBINDEX, TROPONINI in the last 168 hours. BNP (last 3 results) No results for input(s): PROBNP in the last 8760 hours. HbA1C: No results for input(s): HGBA1C in the last 72 hours. CBG: No results for input(s): GLUCAP in the last 168 hours. Lipid Profile:  Recent Labs  12/29/16 0658  CHOL 67  HDL 13*  LDLCALC 27  TRIG 136  CHOLHDL 5.2   Thyroid Function Tests: No results for input(s): TSH, T4TOTAL, FREET4, T3FREE, THYROIDAB in the last 72 hours. Anemia  Panel:  Recent Labs  12/29/16 1013  TIBC 115*  IRON 75   Urine analysis:    Component Value Date/Time   COLORURINE YELLOW 10/28/2016 1600   APPEARANCEUR CLEAR 10/28/2016 1600   LABSPEC 1.011 10/28/2016 1600   PHURINE 6.5 10/28/2016 1600   GLUCOSEU NEGATIVE 10/28/2016 1600   HGBUR NEGATIVE 10/28/2016 1600   BILIRUBINUR NEGATIVE 10/28/2016 1600   KETONESUR NEGATIVE 10/28/2016 1600   PROTEINUR 30 (A) 10/28/2016 1600   UROBILINOGEN 1.0 01/30/2010 1104   NITRITE NEGATIVE 10/28/2016 1600   LEUKOCYTESUR NEGATIVE 10/28/2016 1600   Sepsis Labs: '@LABRCNTIP'$ (procalcitonin:4,lacticidven:4)    Recent Results (from the past 240 hour(s))  MRSA PCR Screening     Status: None   Collection Time: 12/16/2016  8:18 PM  Result Value Ref Range Status   MRSA by PCR NEGATIVE NEGATIVE Final     Radiology Studies: Mr Brain Wo Contrast Result Date: 12/27/2016 1. Punctate 5 mm acute ischemic infarct within the peripheral aspect of the inferior left cerebellar hemisphere. No associated hemorrhage or mass effect. 2. Generalized cerebral atrophy with moderate chronic microvascular ischemic disease and multiple remote infarcts as detailed above. 3. Few scattered foci of susceptibility artifact involving the supratentorial brain and right cerebellar hemisphere, consistent with small chronic micro hemorrhages. These are most commonly related to chronic underlying hypertension. 4. Apparent asymmetric enlargement of the partially visualized left parotid gland, indeterminate. Correlation with physical exam for possible acute parotitis recommended.   Ir Fluoro Guide Cv Line Right Result Date: 12/25/2016 Successful temporary right jugular dialysis catheter placement with its tip at the cavoatrial junction.   Ir US Guide Vasc Access Right Result Date: 12/25/2016 Successful temporary right jugular dialysis catheter placement with its tip at the cavoatrial junction.  Dg Chest Port 1 View Result Date:  12/19/2016 Cardiomegaly with central vascular congestion. Possible small left effusion. No overt pulmonary edema.    Scheduled Meds: . acetaminophen  1,000 mg Oral TID  . aspirin  81 mg Oral Daily  . atorvastatin  80 mg Oral q1800  . carvedilol  3.125 mg Oral BID WC  .  ceFAZolin IV  1 g Intravenous To OR  . cholecalciferol  2,000 Units Oral Daily  . gabapentin  200 mg Oral BID  . mouth rinse  15 mL Mouth Rinse BID  . sucroferric oxyhydroxide  500 mg Oral TID WC  . ticagrelor  90 mg Oral BID  . vitamin B-12  1,500 mcg Oral Daily   Continuous Infusions: . sodium chloride 10 mL/hr at 12/13/2016 1235     LOS: 11 days    Time spent: 25 minutes  Greater than 50% of the time spent on counseling and coordinating the care.   Leisa Lenz, MD [facility] Pager [phone]  If 7PM-7AM, please contact night-coverage [email or web address] Password [email or web address] 12/29/2016, 4:11 PM

## 2016-12-29 NOTE — Procedures (Signed)
I was present at this session.  I have reviewed the session itself and made appropriate changes.  HD via temp cath RIJ.  bp 130s.  tol well thus far.   Ayzia Day L 2/20/201810:10 AM

## 2016-12-30 ENCOUNTER — Inpatient Hospital Stay (HOSPITAL_COMMUNITY): Payer: Medicare HMO

## 2016-12-30 DIAGNOSIS — I6330 Cerebral infarction due to thrombosis of unspecified cerebral artery: Secondary | ICD-10-CM

## 2016-12-30 DIAGNOSIS — I6789 Other cerebrovascular disease: Secondary | ICD-10-CM

## 2016-12-30 LAB — CBC
HEMATOCRIT: 21.4 % — AB (ref 36.0–46.0)
Hemoglobin: 7.1 g/dL — ABNORMAL LOW (ref 12.0–15.0)
MCH: 33.2 pg (ref 26.0–34.0)
MCHC: 33.2 g/dL (ref 30.0–36.0)
MCV: 100 fL (ref 78.0–100.0)
Platelets: 315 10*3/uL (ref 150–400)
RBC: 2.14 MIL/uL — AB (ref 3.87–5.11)
RDW: 20.6 % — ABNORMAL HIGH (ref 11.5–15.5)
WBC: 18 10*3/uL — AB (ref 4.0–10.5)

## 2016-12-30 LAB — VAS US CAROTID
LEFT ECA DIAS: 13 cm/s
LICAPDIAS: -17 cm/s
Left CCA dist dias: 10 cm/s
Left CCA dist sys: 133 cm/s
Left CCA prox dias: 6 cm/s
Left CCA prox sys: 126 cm/s
Left ICA dist dias: -13 cm/s
Left ICA dist sys: -105 cm/s
Left ICA prox sys: -163 cm/s
RCCAPDIAS: -13 cm/s
RIGHT ECA DIAS: 0 cm/s
RIGHT VERTEBRAL DIAS: 12 cm/s
Right CCA prox sys: -87 cm/s
Right cca dist sys: -66 cm/s

## 2016-12-30 LAB — HEMOGLOBIN A1C
HEMOGLOBIN A1C: 4.4 % — AB (ref 4.8–5.6)
MEAN PLASMA GLUCOSE: 80

## 2016-12-30 LAB — BASIC METABOLIC PANEL
ANION GAP: 11 (ref 5–15)
BUN: 30 mg/dL — ABNORMAL HIGH (ref 6–20)
CO2: 26 mmol/L (ref 22–32)
Calcium: 7.8 mg/dL — ABNORMAL LOW (ref 8.9–10.3)
Chloride: 92 mmol/L — ABNORMAL LOW (ref 101–111)
Creatinine, Ser: 2.59 mg/dL — ABNORMAL HIGH (ref 0.44–1.00)
GFR calc Af Amer: 19 mL/min — ABNORMAL LOW (ref >60)
GFR, EST NON AFRICAN AMERICAN: 17 mL/min — AB (ref >60)
GLUCOSE: 85 mg/dL (ref 65–99)
POTASSIUM: 3.2 mmol/L — AB (ref 3.5–5.1)
Sodium: 129 mmol/L — ABNORMAL LOW (ref 135–145)

## 2016-12-30 LAB — ECHOCARDIOGRAM COMPLETE
Height: 60 in
Weight: 2462.1 oz

## 2016-12-30 MED ORDER — OLANZAPINE 5 MG PO TBDP
5.0000 mg | ORAL_TABLET | Freq: Every day | ORAL | Status: DC
  Administered 2016-12-30 – 2017-01-01 (×3): 5 mg via ORAL
  Filled 2016-12-30 (×4): qty 1

## 2016-12-30 NOTE — Progress Notes (Signed)
OT Cancellation Note  Patient Details Name: Dorthula Perfectancy L Hoppel MRN: [id number] DOB: 1941-09-17   Cancelled Treatment:    Reason Eval/Treat Not Completed: Medical issues which prohibited therapy. Per PT, RN reports pt unable to tolerate therapy at this time. Will follow up as time allows.  Gaye AlkenBailey A Brynlie Daza M.S., OTR/L Pager: [phone]  12/30/2016, 11:06 AM

## 2016-12-30 NOTE — Progress Notes (Signed)
  Speech Language Pathology Treatment: Dysphagia  Patient Details Name: Jill Johnson MRN: [id number] DOB: [birth year] Today's Date: 12/30/2016 Time: 1011-1030 SLP Time Calculation (min) (ACUTE ONLY): 19 min  Assessment / Plan / Recommendation Clinical Impression  Pt with limited appetite, but is tolerating POs with adequate recognition/anticipation of boluses, active mastication, brisk swallow, no s/s of aspiration, even with large successive liquid boluses and consumption of pills with water.  No SLP f/u for swallowing is warranted- our services will sign off.    HPI HPI: Jill Johnson is an 76 y.o. 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 Initially under cardiology primary care and [facility] consulted for evaluation of delirium. Pt also underwent aortogram with bilateral LE runoff, left popliteal stenting on 12/25/2016. While in the hospital patient has been diagnosed with non-ST elevation myocardial infarction, end-stage renal disease with hemodialysis, peripheral vascular disease with critical limb ischemia on the left.  Although patient does have peripheral vascular disease and as stated chronic left critical limb ischemia of her left arm patient had left-sided weakness and was sent for MRI of brain. Per Dr. Pearlean BrownieSethi, MRI scan shows tiny superficial lefts inferior cerebellar cortical diffusion hyperintensity which is not dark on corresponding ADC map and is of questionable significance and anyway does not clinically explain possible left-sided weakness. This finding at most may represent a tiny incidental lacunar infarct or an artifact and is not responsible for patient's present clinical symptom.      SLP Plan  Discharge SLP treatment - no further dysphagia  Patient does not need any further Speech  [place] [facility]    Recommendations  Diet recommendations: Regular;Thin liquid Medication Administration: Whole meds with liquid                Oral Care Recommendations: Oral care BID Follow up Recommendations: None SLP Visit Diagnosis: Dysphagia, unspecified (R13.10) Plan: Discharge SLP treatment due to (comment)       GO                Blenda Mountsouture, Janney Priego Laurice 12/30/2016, 10:46 AM

## 2016-12-30 NOTE — Progress Notes (Signed)
Pt has c/o pain all day in between resting. Pt stating she is ready to go home to be with the lord, and she is tired of hurting. Pt doesn't understand why she cant go home to be with lord. RN offered supportive care and conversation. RN also ordered a chaplain to come speak with the patient. Pt currently resting, family at bedside

## 2016-12-30 NOTE — Progress Notes (Signed)
  Echocardiogram 2D Echocardiogram has been performed.  Cathie BeamsGREGORY, Akiva Josey 12/30/2016, 3:53 PM

## 2016-12-30 NOTE — Progress Notes (Signed)
Stroke Neurology Note 75-yo woman with numerous significant chronic medical problems now s/p L popliteal stenting for critical limb ischemia. Hospital course further complicated by NSTEMI. She was noted to have some possible weakness of the LUE. This prompted MRI brain which showed a punctate area of restricted diffusion in the L cerebellum  Subjective ; patient is is complaining of pain and states she asked for pain medication and hour ago and did not get it. She is more alert and cooperative today.  . She denies focal extremity weakness and numbness.   Objective Vitals:   12/29/16 1932 12/30/16 0456  BP: (!) 125/34 (!) 150/62  Pulse: 64 66  Resp: 17 16  Temp: 98.5 F (36.9 C) 98.4 F (36.9 C)  middle aged Caucasian lady.getting hemodialysis. Distal pulses not felt in both lefts. Changes of  Erythema and discoloration in toes left foot. . Afebrile. Head is nontraumatic. Neck is supple without bruit.    Cardiac exam no murmur or gallop. Lungs are clear to auscultation. Distal pulses are well felt. Neurological Exam ;  Awake alert. Oriented 3. Speech and language appear normal. Fundi were not visualized. Eye movements are full range without nystagmus. Blinks to threat bilaterally. Not cooperative for visual field testing. Face is symmetric. Tongue is midline. Motor system exam patient's cooperation is quite limited but she is able to move all 4 extremities against gravity but will not cooperate for detailed exam. I see no focal weakness. Dependent reflexes are present in the upper extremities and depressed in the lower eczema to his. Plantars are downgoing. Diminished touch pinprick sensation in both lower extremity from in stocking distribution.  MRI Brain personally reviewed shows tiny punctate 2 mm peripheral inferior cerebellar cortical hyperdensity which is not dark and corresponding ADC map and is of questionable significance possible artifact Transthoracic echo and carotid Dopplers are both  pending LDL 27 mg percent. Hemoglobin A1c is pending.  IMPRESSION : 76 year old lady  Stroke like symptoms of subjective left-sided weakness with neurological exam showing some nonorganic weakness of giveaway pattern-doubt stroke. MRI scan shows tiny superficial lefts inferior cerebellar cortical diffusion hyperintensity which is not dark on corresponding ADC map and is of questionable significance and anyway does not clinically explain possible left-sided weakness. This finding at most may represent a tiny incidental lacunar infarct or an artifact and is not responsible for patient's present clinical symptom. Recommendation continue aspirin for stroke prevention and follow pending test results  Delia HeadyPramod Sethi, MD Medical Director [facility] Pager: [phone] 12/30/2016 12:54 PM

## 2016-12-30 NOTE — Progress Notes (Signed)
Daily Progress Note   Patient Name: Jill Johnson       Date: 12/30/2016 DOB: 1941-02-14  Age: 76 y.o. MRN#: [id number] Attending Physician: Robbie Lis, MD Primary Care Physician: Marjorie Smolder, MD Admit Date: 01/04/2017  Reason for Consultation/Follow-up: Establishing goals of care given NSTEMI, critical limb ischemia, CVA, ESRD and prolonged altered mental status.  Subjective: Met at bedside with patient, brother, sis in law, dtr in law.  Per family patient has poor quality of life.  She has been suffering with delirium since admission (and possibly before admission).  Hearing and seeing things that she knows are not there.  Patient comments "why is this happening to me?"   Patient is too lethargic to participate in the conversation.  Family that is present feels that she would not want to consider surgery unless her quality of life was going to be significantly better than it is now.  Family states there is no one that could care for her at home - she will need SNF vs Hospice house.  Patient states she feels pain all over - but falls asleep before giving me more details.  Follow up family meeting is scheduled for 8:00 on 2/22 to meet with sons and the patients sister for further decisions.   Patient Profile/HPI:  76 y.o. female  with past medical history of ESRD (on HD), HLD, HTN, CAD s/p CABG (2011), TIA, CVA, critical limb ischemia admitted on 12/14/2016 with chest pain. She had presented for scheduled lower limb angiography, however, before the procedure she was found to have chest pain and EKG revealed ST depression. She is now s/p cardiac cath with stent placement. She has continued to have altered mental status and a small stroke was found on MRI.  She is tolerating HD, but is  bradycardic and lethargic.   Length of Stay: 12  Current Medications: Scheduled Meds:  . acetaminophen  1,000 mg Oral TID  . aspirin  81 mg Oral Daily  . atorvastatin  80 mg Oral q1800  . carvedilol  3.125 mg Oral BID WC  . [START ON 12/31/2016] darbepoetin (ARANESP) injection - DIALYSIS  200 mcg Intravenous Q Thu-HD  . diclofenac sodium  2 g Topical TID  . docusate sodium  100 mg Oral BID  . feeding supplement (PRO-STAT SUGAR FREE 64)  30 mL Oral BID  . gabapentin  200 mg Oral BID  . heparin  5,000 Units Subcutaneous Q8H  . levothyroxine  112 mcg Oral QAC breakfast  . lidocaine  1 application Topical TID  . multivitamin  1 tablet Oral QHS  . OLANZapine zydis  5 mg Oral QHS  . polyethylene glycol  17 g Oral BID  . senna  1 tablet Oral BID  . sodium chloride flush  3 mL Intravenous Q12H  . sucroferric oxyhydroxide  1,000 mg Oral TID WC  . ticagrelor  90 mg Oral BID  . vitamin B-12  1,500 mcg Oral Daily    Continuous Infusions: . sodium chloride 10 mL/hr at 01/02/2017 1235    PRN Meds: sodium chloride, acetaminophen, ALPRAZolam, dextromethorphan-guaiFENesin, gi cocktail, lidocaine, nitroGLYCERIN, ondansetron (ZOFRAN) IV, senna-docusate, sodium chloride flush, traMADol  Physical Exam        Wd, pleasant female, too lethargic to participate in a conversation.  Vital Signs: BP (!) 150/62 (BP Location: Right Arm)   Pulse 66   Temp 98.4 F (36.9 C) (Oral)   Resp 16   Ht 5' (1.524 m)   Wt 69.8 kg (153 lb 14.1 oz)   SpO2 100%   BMI 30.05 kg/m  SpO2: SpO2: 100 % O2 Device: O2 Device: Not Delivered O2 Flow Rate: O2 Flow Rate (L/min): 2 L/min  Intake/output summary:  Intake/Output Summary (Last 24 hours) at 12/30/16 1337 Last data filed at 12/30/16 2694  Gross per 24 hour  Intake              490 ml  Output                0 ml  Net              490 ml   LBM: Last BM Date: 12/29/16 Baseline Weight: Weight: 71.7 kg (158 lb) Most recent weight: Weight: 69.8 kg (153 lb 14.1  oz)       Palliative Assessment/Data:    Flowsheet Rows   Flowsheet Row Most Recent Value  Intake Tab  Referral Department  Hospitalist  Unit at Time of Referral  Cardiac/Telemetry Unit  Palliative Care Primary Diagnosis  Cardiac  Date Notified  12/22/16  Palliative Care Type  New Palliative care  Reason for referral  Clarify Goals of Care  Date of Admission  12/28/2016  # of days IP prior to Palliative referral  4  Clinical Assessment  Psychosocial & Spiritual Assessment  Palliative Care Outcomes      Patient Active Problem List   Diagnosis Date Noted  . Cerebral thrombosis with cerebral infarction 12/29/2016  . Palliative care by specialist   . Palliative care encounter   . Other specified hypothyroidism   . Goals of care, counseling/discussion   . Advance care planning   . Unstable angina ([place]) 01/04/2017  . Anemia, chronic disease 12/17/2016  . ESRD on hemodialysis ([place]) 12/19/2016  . PAD (peripheral artery disease) ([place]) 01/06/2017  . Critical lower limb ischemia 01/04/2017  . Coronary artery disease involving native coronary artery of native heart with unstable angina pectoris ([place]) 12/11/2016  . Chest pain 10/28/2016  . Fluid overload 10/28/2016  . Elevated troponin 10/28/2016  . Symptomatic anemia 10/28/2016  . ESRD (end stage renal disease) ([place]) 10/28/2016  . Hypothyroidism 10/28/2016  . Respiratory distress 10/28/2016  . Acute diastolic heart failure ([place]) 12/20/2015  . Obesity 12/20/2015  . Coronary artery disease due to lipid rich plaque 12/20/2015  . S/P AVR (  aortic valve replacement) 12/20/2015  . Chronic kidney disease, stage 3 12/20/2015    Palliative Care Plan    Recommendations/Plan:  Delirium:  Will start low dose Zyprexa qhs. GOC:  Will meeting decision makers [name]) 2/22 at 8:00 am  Goals of Care and Additional Recommendations:  Limitations on Scope of Treatment: Full Scope Treatment  Code Status:  DNR  Prognosis:    Unable to determine pending [place] discussion.  If the patient continues HD she likely has weeks to months.  If it is determined that she will stop HD she has less than 2 weeks.   Discharge Planning:  To Be Determined  SNF vs Hospice House.  Care plan was discussed with Patient's brother, sister in law and dtr in law.  Thank you for allowing the Palliative Medicine Team to assist in the care of this patient.  Total time spent:  60 min.      Greater than 50%  of this time was spent counseling and coordinating care related to the above assessment and plan.  Imogene Burn, PA-C Palliative Medicine  Please contact Palliative MedicineTeam phone at [phone] for questions and concerns between 7 am - 7 pm.   Please see AMION for individual provider pager numbers.

## 2016-12-30 NOTE — Progress Notes (Addendum)
Patient ID: Jill Johnson, female   DOB: 08-Feb-1941, 76 y.o.   MRN: [id number]  PROGRESS NOTE    Jill Johnson  AOZ:[id number] DOB: 09-Apr-1941 DOA: 12/21/2016  PCP: Marjorie Smolder, MD   Brief Narrative:  76 year old female with past medical history significant for end-stage renal disease on hemodialysis (TTS) started about 2 months ago, CAD status post CABG and AVR (AV replacement), hypothyroidism, hypertension, recent critical limb ischemia. Initially under cardiology primary care and [facility] consulted for evaluation of delirium. Please note patient under [place] primary care starting 12/22/2016.  Patient has had left leg/foot pain for past month or so prior to this admission. She saw Dr. early in clinic and the plan was for outpatient angiography. In short stay, prior to the procedure, patient started to have chest pain and EKG showed new ST depressions. Patient was taken to cath lab on 12/30/2016. She is status post cardiac catheterization with SVG to PDA DES stent placement.  Pt also underwent aortogram with bilateral LE runoff, left popliteal stenting on 12/30/2016.   Pt had problems with infiltration. Per renal, it would be optimal to rest the fistula- ideally should have TDC placement but likely not possible in the setting of DAPT requirement. PCT has met with pt who expressed desire to continue HD. RIJV HD catheter placed 2/16.  More left lower extremity weakness on 2/18 reported by family. MRI brain done and found to have acute infarct in left cerebellar hemisphere. Seen by stroke team.   Assessment & Plan:  End-stage renal disease with hemodialysis -  Per renal, need to rest the fistula- ideally should have TDC placement but likely not possible in the setting of DAPT requirement. PCT has met with pt who expressed desire to continue HD - RIJV HD cath placed 2/16 per IR - HD per renal schedule - Appreciate palliative care team meeting with family to discuss overall goals of  care   Acute ischemic infarct left cerebellar hemisphere - Stroke work up initiated - Continue daily aspirin  - MRI brain - Punctate 5 mm acute ischemic infarct within the peripheral aspect of the inferior left cerebellar hemisphere. No associated hemorrhage or mass effect. - 2D ECHO - results not yet in as of this am 2/21 - HgbA1c - pending as of this am 2/21 - Lipid panel - 27. LDL goal < 100. Patient on Lipitor 80 mg at bedtime  - Diet: renal diet  - Therapy: PT/OT - SNF recommended on discharge  - Appreciate neurology following - per stroke team no further aggressive stroke work up necessary   Non-ST elevation myocardial infarction  - SVG to PDA stent placement, DES.  - Dual antiplatelet therapy for at least 6 months per cardiology   - Continue with Brilinta for now, aspirin. Per cario, possibly change to plavix in future  - Per cardio, continue aggressive secondary prevention with asa and brilinta as well as atorvastatin, carvedilol  Peripheral vascular disease with critical limb ischemia - Per Dr. Donnetta Hutching pt is very poor surgical candidate - S/P Aortogram with bilateral LE runoff, left popliteal stenting 12/10/2016 by Dr. Sherren Mocha Early   Hyperlipidemia - Continue statin therapy   Bioprosthetic aortic valve replacement 2011  - Recent echocardiogram in December 2017 showed normal function  Coronary artery disease status post CABG 2011  - SVG to PDA stenting  Anemia of chronic kidney disease - Follow up hgb this am - Transfuse for hgb less than 7  Hyponatremia - Follow up BMP this am -  Renal following, appreciate their input   Delirium - Stable, intermittent confusion   Hypothyroidism  - Continue synthroid   Bilateral LEs and left foot toes pressure injury  - Left medial LE:  3.5 x 2 with depth obscured by the presence of eschar, no exudate - Left lateral LE:  3cm x 1cm x 0.1cm partial thickness with pink, moist base and scant serous exudate - Right LE (anterior):   2.5cm x 1.5cm x 0.2cm full thickness wound with light yellow, moist wound bed and small amount of serous exudate - Dressing procedure/placement/frequency: Conservative wound care using betadine swab sticks to the left toes with eschar in an effort to keep them dry and infection free.  Prevalon Boots are provided bilaterally to protect from injury.  The LE wounds care twice daily with saline moistened gauze and topped with protective silicone foam.     DVT prophylaxis: Asa, brilinta  Code Status: DNR/DNI Family Communication: no family at the bedside this am; updated son over the phone 2/19; called again this am Disposition Plan: plan to D/C to SNF once cleared by renal    Consultants:   Cardiology   Nephrology   Vascular surgery, Dr. Sherren Mocha Early   Neurology - consulted 2/18 for acute CVA  Palliative care   WOC  Procedures:   ESDR on HD TTS  Aortogram with bilateral LE runoff, left popliteal stenting - 12/11/2016  RIJV HF catheter placed 2/16  Antimicrobials:   None    Subjective: No overnight events.   Objective: Vitals:   12/29/16 1310 12/29/16 1335 12/29/16 1932 12/30/16 0456  BP: (!) 110/35 118/62 (!) 125/34 (!) 150/62  Pulse: (!) 59 71 64 66  Resp: '19 20 17 16  '$ Temp:  97.4 F (36.3 C) 98.5 F (36.9 C) 98.4 F (36.9 C)  TempSrc:  Oral Oral Oral  SpO2:  95% 98% 100%  Weight:  71.6 kg (157 lb 13.6 oz)  69.8 kg (153 lb 14.1 oz)  Height:    5' (1.524 m)    Intake/Output Summary (Last 24 hours) at 12/30/16 0824 Last data filed at 12/30/16 0300  Gross per 24 hour  Intake              370 ml  Output             2000 ml  Net            -1630 ml   Filed Weights   12/29/16 0915 12/29/16 1335 12/30/16 0456  Weight: 73.8 kg (162 lb 11.2 oz) 71.6 kg (157 lb 13.6 oz) 69.8 kg (153 lb 14.1 oz)    Examination:  General exam: No acute distress Respiratory system: Bilateral air entry, no wheezing  Cardiovascular system: S1 & S2 heard, Rate controlled; SEM  appreciate 2/6 Gastrointestinal system: (+) BS, no distention  Central nervous system: no focal deficits  Extremities: No tenderness, necrotic toes  Skin: LLE necrotic wounds on medical aspect, L toes necrotic; LUA AVF Psychiatry: normal mood and behavior   Data Reviewed: I have personally reviewed following labs and imaging studies  CBC:  Recent Labs Lab 12/25/16 0305 12/26/16 0510 12/27/16 0418 12/28/16 0431 12/29/16 0658  WBC 9.3 7.8 10.7* 11.8* 12.3*  HGB 7.8* 8.0* 8.4* 7.5* 7.2*  HCT 23.8* 24.6* 25.6* 22.8* 21.9*  MCV 97.9 98.8 98.5 98.7 100.0  PLT 294 289 323 326 818   Basic Metabolic Panel:  Recent Labs Lab 12/24/16 0700 12/25/16 0305 12/26/16 0510 12/27/16 0418 12/28/16 0431 12/29/16 1013  NA 126* 129* 128* 129* 127* 127*  K 4.0 3.5 3.8 3.6 4.2 4.5  CL 91* 92* 91* 94* 93* 92*  CO2 '24 28 27 26 25 24  '$ GLUCOSE 79 96 95 107* 87 85  BUN 36* 24* 40* 22* 40* 70*  CREATININE 4.37* 3.89* 4.63* 2.66* 3.63* 4.66*  CALCIUM 7.8* 7.4* 7.8* 7.8* 7.8* 8.0*  PHOS 5.4*  --   --   --   --  3.8   GFR: Estimated Creatinine Clearance: 8.8 mL/min (by C-G formula based on SCr of 4.66 mg/dL (H)). Liver Function Tests:  Recent Labs Lab 12/24/16 0700 12/29/16 1013  AST  --  32  ALT  --  7*  ALKPHOS  --  102  BILITOT  --  1.1  PROT  --  4.4*  ALBUMIN 2.1* 1.6*   No results for input(s): LIPASE, AMYLASE in the last 168 hours. No results for input(s): AMMONIA in the last 168 hours. Coagulation Profile: No results for input(s): INR, PROTIME in the last 168 hours. Cardiac Enzymes: No results for input(s): CKTOTAL, CKMB, CKMBINDEX, TROPONINI in the last 168 hours. BNP (last 3 results) No results for input(s): PROBNP in the last 8760 hours. HbA1C: No results for input(s): HGBA1C in the last 72 hours. CBG: No results for input(s): GLUCAP in the last 168 hours. Lipid Profile:  Recent Labs  12/29/16 0658  CHOL 67  HDL 13*  LDLCALC 27  TRIG 136  CHOLHDL 5.2    Thyroid Function Tests: No results for input(s): TSH, T4TOTAL, FREET4, T3FREE, THYROIDAB in the last 72 hours. Anemia Panel:  Recent Labs  12/29/16 1013  TIBC 115*  IRON 75   Urine analysis:    Component Value Date/Time   COLORURINE YELLOW 10/28/2016 1600   APPEARANCEUR CLEAR 10/28/2016 1600   LABSPEC 1.011 10/28/2016 1600   PHURINE 6.5 10/28/2016 1600   GLUCOSEU NEGATIVE 10/28/2016 1600   HGBUR NEGATIVE 10/28/2016 1600   BILIRUBINUR NEGATIVE 10/28/2016 1600   KETONESUR NEGATIVE 10/28/2016 1600   PROTEINUR 30 (A) 10/28/2016 1600   UROBILINOGEN 1.0 01/30/2010 1104   NITRITE NEGATIVE 10/28/2016 1600   LEUKOCYTESUR NEGATIVE 10/28/2016 1600   Sepsis Labs: '@LABRCNTIP'$ (procalcitonin:4,lacticidven:4)    Recent Results (from the past 240 hour(s))  MRSA PCR Screening     Status: None   Collection Time: 12/17/2016  8:18 PM  Result Value Ref Range Status   MRSA by PCR NEGATIVE NEGATIVE Final     Radiology Studies: Mr Brain Wo Contrast Result Date: 12/27/2016 1. Punctate 5 mm acute ischemic infarct within the peripheral aspect of the inferior left cerebellar hemisphere. No associated hemorrhage or mass effect. 2. Generalized cerebral atrophy with moderate chronic microvascular ischemic disease and multiple remote infarcts as detailed above. 3. Few scattered foci of susceptibility artifact involving the supratentorial brain and right cerebellar hemisphere, consistent with small chronic micro hemorrhages. These are most commonly related to chronic underlying hypertension. 4. Apparent asymmetric enlargement of the partially visualized left parotid gland, indeterminate. Correlation with physical exam for possible acute parotitis recommended.   Ir Fluoro Guide Cv Line Right Result Date: 12/25/2016 Successful temporary right jugular dialysis catheter placement with its tip at the cavoatrial junction.   Ir US Guide Vasc Access Right Result Date: 12/25/2016 Successful temporary right  jugular dialysis catheter placement with its tip at the cavoatrial junction.  Dg Chest Port 1 View Result Date: 12/19/2016 Cardiomegaly with central vascular congestion. Possible small left effusion. No overt pulmonary edema.    Scheduled Meds: . acetaminophen  1,000 mg Oral TID  . aspirin  81 mg Oral Daily  . atorvastatin  80 mg Oral q1800  . carvedilol  3.125 mg Oral BID WC  .  ceFAZolin IV  1 g Intravenous To OR  . cholecalciferol  2,000 Units Oral Daily  . gabapentin  200 mg Oral BID  . mouth rinse  15 mL Mouth Rinse BID  . sucroferric oxyhydroxide  500 mg Oral TID WC  . ticagrelor  90 mg Oral BID  . vitamin B-12  1,500 mcg Oral Daily   Continuous Infusions: . sodium chloride 10 mL/hr at 12/19/2016 1235     LOS: 12 days    Time spent: 15 minutes  Greater than 50% of the time spent on counseling and coordinating the care.   Leisa Lenz, MD [facility] Pager [phone]  If 7PM-7AM, please contact night-coverage [email or web address] Password [facility] 12/30/2016, 8:24 AM

## 2016-12-30 NOTE — Progress Notes (Signed)
No charge note.  Family unable to meet today.  We have scheduled a meeting for tomorrow morning at 8:00 am.  Algis DownsMarianne York, [place] JerseyPA-C Palliative Medicine Pager: [phone]

## 2016-12-30 NOTE — Progress Notes (Signed)
PT Cancellation Note  Patient Details Name: Jill Johnson MRN: [id number] DOB: 10/11/1941   Cancelled Treatment:    Reason Eval/Treat Not Completed: Medical issues which prohibited therapy.  Nursing asked PT to hold visit due to pt not being able to tolerate the visit, and reported to OT as well.  Will check with nursing tomorrow as family has a meeting to plan her care and make decisions, and may affect rehab involvement.   Ivar DrapeRuth E Dakari Stabler 12/30/2016, 10:30 AM   Samul Dadauth Binnie Vonderhaar, PT MS Acute Rehab Dept. Number: [facility] [phone] and [facility] [phone]

## 2016-12-30 NOTE — Clinical Social Work Note (Signed)
Message left for director of [facility] requesting call back to provide update to facility on patient's condition.   Roddie McBryant Cleon Signorelli MSW, ConwayLCSW, [place], [phone]

## 2016-12-30 NOTE — Evaluation (Signed)
Speech Language Pathology Evaluation Patient Details Name: Jill Johnson MRN: [id number] DOB: 1941/07/10 Today's Date: 12/30/2016 Time: 1000-1011 SLP Time Calculation (min) (ACUTE ONLY): 11 min  Problem List:  Patient Active Problem List   Diagnosis Date Noted  . Cerebral thrombosis with cerebral infarction 12/29/2016  . Palliative care by specialist   . Palliative care encounter   . Other specified hypothyroidism   . Goals of care, counseling/discussion   . Advance care planning   . Unstable angina (HCC) 12/15/2016  . Anemia, chronic disease 01/02/2017  . ESRD on hemodialysis (HCC) 12/22/2016  . PAD (peripheral artery disease) (HCC) 12/10/2016  . Critical lower limb ischemia 01/02/2017  . Coronary artery disease involving native coronary artery of native heart with unstable angina pectoris (HCC) 12/26/2016  . Chest pain 10/28/2016  . Fluid overload 10/28/2016  . Elevated troponin 10/28/2016  . Symptomatic anemia 10/28/2016  . ESRD (end stage renal disease) (HCC) 10/28/2016  . Hypothyroidism 10/28/2016  . Respiratory distress 10/28/2016  . Acute diastolic heart failure (HCC) 12/20/2015  . Obesity 12/20/2015  . Coronary artery disease due to lipid rich plaque 12/20/2015  . S/P AVR (aortic valve replacement) 12/20/2015  . Chronic kidney disease, stage 3 12/20/2015   Past Medical History:  Past Medical History:  Diagnosis Date  . Anemia   . CAD (coronary artery disease)   . CFS (chronic fatigue syndrome)   . Chronic rhinitis   . Complication of anesthesia    "a little hard waking up"  . Constipation    due to iron  . Coronary atherosclerosis   . Critical lower limb ischemia 12/25/2016  . Depression   . Difficult intubation    "I have a small opening" When she had her CABG   . DJD (degenerative joint disease)   . ESRD on hemodialysis (HCC) 12/28/2016  . Hyperlipidemia   . Hypertension   . Hypothyroidism   . Kidney stone   . MDD (major depressive disorder)   .  Neuropathy (HCC)    in legs  . Obesity   . Osteopenia   . PAD (peripheral artery disease) (HCC) 12/29/2016  . Renal insufficiency    Post Op  . Stroke [facility])    TIA, several   Past Surgical History:  Past Surgical History:  Procedure Laterality Date  . ABDOMINAL HYSTERECTOMY  1976  . AORTIC VALVE REPLACEMENT    . AORTOGRAM N/A 12/19/2016   Procedure: AORTOGRAM WITH BILATERAL LOWER EXTREMITY RUNOFF; LEFT POPLITEAL STENTING;  Surgeon: Larina Earthly, MD;  Location: [facility] OR;  Service: Vascular;  Laterality: N/A;  . AV FISTULA PLACEMENT Left 08/07/2016   Procedure: CREATION OF  LEFT UPPER ARM ARTERIOVENOUS (AV) FISTULA;  Surgeon: Larina Earthly, MD;  Location: [facility] OR;  Service: Vascular;  Laterality: Left;  . BACK SURGERY  1980's  . CARDIAC VALVE REPLACEMENT    . CORONARY ARTERY BYPASS GRAFT    . CORONARY STENT INTERVENTION N/A 12/12/2016   Procedure: Coronary Stent Intervention;  Surgeon: Tonny Bollman, MD;  Location[facility] INVASIVE CV LAB;  Service: Cardiovascular;  Laterality: N/A;  . IR GENERIC HISTORICAL  12/25/2016   IR US GUIDE VASC ACCESS RIGHT 12/25/2016 Jolaine Click, MD [facility]-INTERV RAD  . IR GENERIC HISTORICAL  12/25/2016   IR FLUORO GUIDE CV LINE RIGHT 12/25/2016 Jolaine Click, MD [facility]-INTERV RAD  . KNEE ARTHROSCOPY    . LEFT HEART CATH AND CORS/GRAFTS ANGIOGRAPHY N/A 01/04/2017   Procedure: Left Heart Cath and Cors/Grafts Angiography;  Surgeon: Tonny Bollman, MD;  Location: [facility] INVASIVE CV LAB;  Service: Cardiovascular;  Laterality: N/A;   HPI:  Jill Johnson is an [age] y.o. 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 Initially under cardiology primary care and [facility] consulted for evaluation of delirium. Pt also underwent aortogram with bilateral LE runoff, left popliteal stenting on 2017-04-30. While in the  hospital patient has been diagnosed with non-ST elevation myocardial infarction, end-stage renal disease with hemodialysis, peripheral vascular disease with critical limb ischemia on the left.  Although patient does have peripheral vascular disease and as stated chronic left critical limb ischemia of her left arm patient had left-sided weakness and was sent for MRI of brain. Per Dr. Pearlean BrownieSethi, "MRI scan shows tiny superficial lefts inferior cerebellar cortical diffusion hyperintensity which is not dark on corresponding ADC map and is of questionable significance and anyway does not clinically explain possible left-sided weakness. This finding at most may represent a tiny incidental lacunar infarct or an artifact and is not responsible for patient's present clinical symptom."   Assessment / Plan / Recommendation Clinical Impression  Pt only able to participate in limited evaluation due to significant pain, which was addressed by RN during session.  She demonstrates normal fluency, verbal expression and comprehension; no focal CN deficits, no dysarthria.  Baseline cognitive deficits with fluctuating confusion, exacerated by pain.  No focal deficits identified that would warrant SLP intervention - our services will sign off.     SLP Assessment  SLP Recommendation/Assessment: Patient does not need any further Speech Lanaguage Pathology Services SLP Visit Diagnosis: Cognitive communication deficit (R41.841)    Follow Up Recommendations  None    Frequency and Duration           SLP Evaluation Cognition  Orientation Level: Oriented to place;Oriented to person;Disoriented to situation;Disoriented to time Attention: Sustained Sustained Attention: Impaired Sustained Attention Impairment: Verbal basic (distracted by pain) Memory: Impaired Awareness: Appears intact Behaviors: Restless;Poor frustration tolerance       Comprehension  Auditory Comprehension Overall Auditory Comprehension: Appears within  functional limits for tasks assessed Visual Recognition/Discrimination Discrimination: Within Function Limits Reading Comprehension Reading Status: Not tested    Expression Expression Primary Mode of Expression: Verbal Verbal Expression Overall Verbal Expression: Appears within functional limits for tasks assessed   Oral / Motor  Motor Speech Overall Motor Speech: Appears within functional limits for tasks assessed   GO                    Blenda MountsCouture, Violette Morneault Laurice 12/30/2016, 10:41 AM

## 2016-12-30 NOTE — Clinical Social Work Note (Signed)
CSW continues to follow the patient for discharge needs. CSW notes that palliative care meeting is scheduled for tomorrow.  Roddie McBryant Danyle Boening MSW, SterlingLCSW, [place], [phone]

## 2016-12-30 NOTE — Progress Notes (Signed)
Subjective: Interval History: Hurts all over.  Is it day or night?  Objective: Vital signs in last 24 hours: Temp:  [97.4 F (36.3 C)-98.7 F (37.1 C)] 98.4 F (36.9 C) (02/21 0456) Pulse Rate:  [53-71] 66 (02/21 0456) Resp:  [12-20] 16 (02/21 0456) BP: (78-157)/(34-82) 150/62 (02/21 0456) SpO2:  [95 %-100 %] 100 % (02/21 0456) Weight:  [69.8 kg (153 lb 14.1 oz)-73.8 kg (162 lb 11.2 oz)] 69.8 kg (153 lb 14.1 oz) (02/21 0456) Weight change: 1.406 kg (3 lb 1.6 oz)  Intake/Output from previous day: 02/20 0701 - 02/21 0700 In: 370 [P.O.:370] Out: 2000  Intake/Output this shift: No intake/output data recorded.  General appearance: cooperative, moderately obese, pale and confused Neck: cooperative, moderately obese, pale and RIJ cath Resp: diminished breath sounds bilaterally Cardio: S1, S2 normal and systolic murmur: systolic ejection 3/6, decrescendo at 2nd left intercostal space GI: obese, pos bs, mild distension Extremities: AVF LUA with Bruise, cool RLE, L worse, blisters on calf, gangrenous toes  Lab Results:  Recent Labs  12/28/16 0431 12/29/16 0658  WBC 11.8* 12.3*  HGB 7.5* 7.2*  HCT 22.8* 21.9*  PLT 326 337   BMET:  Recent Labs  12/28/16 0431 12/29/16 1013  NA 127* 127*  K 4.2 4.5  CL 93* 92*  CO2 25 24  GLUCOSE 87 85  BUN 40* 70*  CREATININE 3.63* 4.66*  CALCIUM 7.8* 8.0*   No results for input(s): PTH in the last 72 hours. Iron Studies:  Recent Labs  12/29/16 1013  IRON 75  TIBC 115*    Studies/Results: Dg Hip Unilat With Pelvis 2-3 Views Right  Result Date: 12/28/2016 CLINICAL DATA:  Generalized right hip pain.  Bruising. EXAM: DG HIP (WITH OR WITHOUT PELVIS) 2-3V RIGHT COMPARISON:  None. FINDINGS: Dense vascular calcifications are identified. Osteopenia. No hip fracture or dislocation. No bony lesions. Mild degenerative changes in the right hip. A true lateral view was not obtained. IMPRESSION: No fracture or dislocation.  Mild degenerative  changes. Electronically Signed   By: Gerome Samavid  Williams III M.D   On: 12/28/2016 17:45    I have reviewed the patient's current medications.  Assessment/Plan: 1 ESRD did fair on HD yest 2 Confusion ?? Medication, acute illness, ?underlying dementia 3 PVD may need amp if pain cont 4 DM controlled 5 Obesity 6 Anemia esa, Fe ok 7 HPTH vit D 8 Dispo ?? Where we are going at this time. PC needed and goals of care P HD in am, esa, , Goals of care    LOS: 12 days   Veronika Heard L 12/30/2016,7:36 AM

## 2016-12-30 NOTE — Progress Notes (Signed)
Subjective: Interval History: none.Marland KitchenResting comfortable.  Confused   Objective: Vital signs in last 24 hours: Temp:  [97.4 F (36.3 C)-98.5 F (36.9 C)] 98.4 F (36.9 C) (02/21 0456) Pulse Rate:  [54-71] 66 (02/21 0456) Resp:  [12-20] 16 (02/21 0456) BP: (78-157)/(34-82) 150/62 (02/21 0456) SpO2:  [95 %-100 %] 100 % (02/21 0456) Weight:  [153 lb 14.1 oz (69.8 kg)-157 lb 13.6 oz (71.6 kg)] 153 lb 14.1 oz (69.8 kg) (02/21 0456)  Intake/Output from previous day: 02/20 0701 - 02/21 0700 In: 370 [P.O.:370] Out: 2000  Intake/Output this shift: Total I/O In: 120 [P.O.:120] Out: -   Left foot stable. Color pink with demarcation of her toes  Lab Results:  Recent Labs  12/28/16 0431 12/29/16 0658  WBC 11.8* 12.3*  HGB 7.5* 7.2*  HCT 22.8* 21.9*  PLT 326 337   BMET  Recent Labs  12/28/16 0431 12/29/16 1013  NA 127* 127*  K 4.2 4.5  CL 93* 92*  CO2 25 24  GLUCOSE 87 85  BUN 40* 70*  CREATININE 3.63* 4.66*  CALCIUM 7.8* 8.0*    Studies/Results: Mr Brain Wo Contrast  Result Date: 12/27/2016 CLINICAL DATA:  Initial evaluation for acute left-sided weakness, lethargy. EXAM: MRI HEAD WITHOUT CONTRAST TECHNIQUE: Multiplanar, multiecho pulse sequences of the brain and surrounding structures were obtained without intravenous contrast. COMPARISON:  None available. FINDINGS: Brain: Diffuse prominence of the CSF containing spaces is compatible with generalized age-related cerebral atrophy. Patchy and confluent T2/FLAIR hyperintensity within the periventricular and deep white matter both cerebral hemispheres most compatible with chronic small vessel ischemic disease, moderate nature. Chronic microvascular ischemic changes present within the pons as well. Superimposed remote lacunar infarcts present within the bilateral basal ganglia and pons. There is a remote cortical/ subcortical infarct within the posterior right frontal parietal region near the vertex. Focal encephalomalacia at the  anterior right temporal pole may be related to remote ischemia or possibly trauma. There is a punctate 5 mm focus of restricted diffusion within the peripheral aspect of the inferior left cerebellar hemisphere (series 4, image 5). Consistent with a tiny acute ischemic infarct. No associated hemorrhage or mass effect. No other evidence for acute or subacute ischemia. Gray-white matter differentiation otherwise maintained. Few scattered foci of susceptibility artifact present within the bilateral cerebral hemispheres, with additional prominent focus within the right cerebellar hemisphere. Findings most consistent with small chronic micro hemorrhages, most likely hypertensive in nature. No mass lesion, midline shift or mass effect. No hydrocephalus. No extra-axial fluid collection. Major dural sinuses are grossly patent. 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 Vascular: Abnormal flow void within the distal left vertebral artery, which may be related to slow flow and/ or occlusion. Major intracranial vascular flow voids otherwise maintained. Skull and upper cervical spine: Degenerative thickening of the tectorial membrane noted without significant stenosis. Remainder the visualized upper cervical spine otherwise unremarkable. Craniocervical junction otherwise unremarkable. Bone marrow signal intensity within normal limits. No scalp soft tissue abnormality. Sinuses/Orbits: Globes and orbital soft tissues within normal limits. Mild scattered mucosal thickening within the ethmoidal air cells and left sphenoid sinus. Paranasal sinuses are otherwise clear. Partially visualized left parotid gland is larger in size as compared to the partially visualized right, of uncertain significance. Additionally, there is mildly increased associated diffusion signal is compared to the contralateral right. Question acute parotitis. IMPRESSION: 1. Punctate 5 mm acute ischemic infarct within the peripheral aspect of the inferior left  cerebellar hemisphere. No associated hemorrhage or mass effect. 2. Generalized cerebral  atrophy with moderate chronic microvascular ischemic disease and multiple remote infarcts as detailed above. 3. Few scattered foci of susceptibility artifact involving the supratentorial brain and right cerebellar hemisphere, consistent with small chronic micro hemorrhages. These are most commonly related to chronic underlying hypertension. 4. Apparent asymmetric enlargement of the partially visualized left parotid gland, indeterminate. Correlation with physical exam for possible acute parotitis recommended. Electronically Signed   By: Rise Mu M.D.   On: 12/27/2016 23:44   Ir Fluoro Guide Cv Line Right  Result Date: 12/25/2016 INDICATION: Renal failure EXAM: IR RIGHT FLOURO GUIDE CV LINE; IR ULTRASOUND GUIDANCE VASC ACCESS RIGHT MEDICATIONS: None. ANESTHESIA/SEDATION: None. FLUOROSCOPY TIME:  Fluoroscopy Time: 4 minutes 30 seconds (26 mGy). COMPLICATIONS: None immediate. PROCEDURE: Informed written consent was obtained from the patient after a thorough discussion of the procedural risks, benefits and alternatives. All questions were addressed. Maximal Sterile Barrier Technique was utilized including caps, mask, sterile gowns, sterile gloves, sterile drape, hand hygiene and skin antiseptic. A timeout was performed prior to the initiation of the procedure. The right neck was prepped and draped in a sterile fashion. 1% lidocaine was utilized for local anesthesia. Under sonographic guidance, a micropuncture needle was inserted into the right jugular vein and up sized to an Amplatz wire. The dilator followed by the temporary dialysis catheter were advanced over the wire to the cavoatrial junction. It was flushed and sewn in place with an 0 Prolene knot. FINDINGS: A temporary right jugular dialysis catheter has been placed with its tip at the cavoatrial junction. IMPRESSION: Successful temporary right jugular dialysis  catheter placement with its tip at the cavoatrial junction. Electronically Signed   By: Jolaine Click M.D.   On: 12/25/2016 16:40   Ir US Guide Vasc Access Right  Result Date: 12/25/2016 INDICATION: Renal failure EXAM: IR RIGHT FLOURO GUIDE CV LINE; IR ULTRASOUND GUIDANCE VASC ACCESS RIGHT MEDICATIONS: None. ANESTHESIA/SEDATION: None. FLUOROSCOPY TIME:  Fluoroscopy Time: 4 minutes 30 seconds (26 mGy). COMPLICATIONS: None immediate. PROCEDURE: Informed written consent was obtained from the patient after a thorough discussion of the procedural risks, benefits and alternatives. All questions were addressed. Maximal Sterile Barrier Technique was utilized including caps, mask, sterile gowns, sterile gloves, sterile drape, hand hygiene and skin antiseptic. A timeout was performed prior to the initiation of the procedure. The right neck was prepped and draped in a sterile fashion. 1% lidocaine was utilized for local anesthesia. Under sonographic guidance, a micropuncture needle was inserted into the right jugular vein and up sized to an Amplatz wire. The dilator followed by the temporary dialysis catheter were advanced over the wire to the cavoatrial junction. It was flushed and sewn in place with an 0 Prolene knot. FINDINGS: A temporary right jugular dialysis catheter has been placed with its tip at the cavoatrial junction. IMPRESSION: Successful temporary right jugular dialysis catheter placement with its tip at the cavoatrial junction. Electronically Signed   By: Jolaine Click M.D.   On: 12/25/2016 16:40   Dg Chest Port 1 View  Result Date: 12/19/2016 CLINICAL DATA:  Preeti Winegardner termination of dialysis diminished lung sounds EXAM: PORTABLE CHEST 1 VIEW COMPARISON:  10/28/2016 FINDINGS: Post sternotomy changes and valvular prosthesis. Cardiomegaly is present. Central vascular congestion without overt edema. Possible small left pleural effusion. Atherosclerosis. No pneumothorax. IMPRESSION: Cardiomegaly with central  vascular congestion. Possible small left effusion. No overt pulmonary edema. Electronically Signed   By: Jasmine Pang M.D.   On: 12/19/2016 23:59   Dg Hip Unilat With Pelvis 2-3 Views Right  Result Date: 12/28/2016 CLINICAL DATA:  Generalized right hip pain.  Bruising. EXAM: DG HIP (WITH OR WITHOUT PELVIS) 2-3V RIGHT COMPARISON:  None. FINDINGS: Dense vascular calcifications are identified. Osteopenia. No hip fracture or dislocation. No bony lesions. Mild degenerative changes in the right hip. A true lateral view was not obtained. IMPRESSION: No fracture or dislocation.  Mild degenerative changes. Electronically Signed   By: Gerome Samavid  Williams III M.D   On: 12/28/2016 17:45   Anti-infectives: Anti-infectives    Start     Dose/Rate Route Frequency Ordered Stop   12/17/2016 1215  ceFAZolin (ANCEF) 2-4 GM/100ML-% IVPB    Comments:  Scronce, Trina   : cabinet override      12/14/2016 1215 12/24/16 0029   01/02/2017 0600  ceFAZolin (ANCEF) IVPB 1 g/50 mL premix    Comments:  Send with pt to OR   1 g 100 mL/hr over 30 Minutes Intravenous To Surgery 12/22/16 0815 12/24/16 0600      Assessment/Plan: s/p Procedure(s): AORTOGRAM WITH BILATERAL LOWER EXTREMITY RUNOFF; LEFT POPLITEAL STENTING (N/A) Does not appear to be having significant rest pain in her left foot. Very difficult to tell since she is quite disoriented and states that everything hurts. Certainly not in the situation she was prior to opening her popliteal artery. At that time she was in agony from her left foot and was inconsolable unless her foot was staying dependent. I do not feel that she has any indication for amputation currently. Family is discussing with palliative care. When I have discussed situation with the son I have explained that she is showing continued multisystem failure with a downward spiral. Nothing actively for vascular. I will be out of town until Monday. Will not follow actively. Please call my partners if there is any  vascular concerns   LOS: 12 days   Aubrynn Katona 12/30/2016, 9:16 AM

## 2016-12-31 DIAGNOSIS — R410 Disorientation, unspecified: Secondary | ICD-10-CM

## 2016-12-31 DIAGNOSIS — E876 Hypokalemia: Secondary | ICD-10-CM

## 2016-12-31 LAB — BASIC METABOLIC PANEL
ANION GAP: 10 (ref 5–15)
BUN: 39 mg/dL — ABNORMAL HIGH (ref 6–20)
CALCIUM: 7.9 mg/dL — AB (ref 8.9–10.3)
CO2: 27 mmol/L (ref 22–32)
CREATININE: 3.22 mg/dL — AB (ref 0.44–1.00)
Chloride: 90 mmol/L — ABNORMAL LOW (ref 101–111)
GFR calc Af Amer: 15 mL/min — ABNORMAL LOW (ref >60)
GFR calc non Af Amer: 13 mL/min — ABNORMAL LOW (ref >60)
Glucose, Bld: 92 mg/dL (ref 65–99)
POTASSIUM: 3.3 mmol/L — AB (ref 3.5–5.1)
Sodium: 127 mmol/L — ABNORMAL LOW (ref 135–145)

## 2016-12-31 LAB — PHOSPHORUS: Phosphorus: 3.1 mg/dL (ref 2.5–4.6)

## 2016-12-31 LAB — CBC
HEMATOCRIT: 19.3 % — AB (ref 36.0–46.0)
Hemoglobin: 6.4 g/dL — CL (ref 12.0–15.0)
MCH: 34 pg (ref 26.0–34.0)
MCHC: 33.7 g/dL (ref 30.0–36.0)
MCV: 101 fL — AB (ref 78.0–100.0)
PLATELETS: 299 10*3/uL (ref 150–400)
RBC: 1.91 MIL/uL — AB (ref 3.87–5.11)
RDW: 21 % — ABNORMAL HIGH (ref 11.5–15.5)
WBC: 17 10*3/uL — AB (ref 4.0–10.5)

## 2016-12-31 LAB — PREPARE RBC (CROSSMATCH)

## 2016-12-31 MED ORDER — TRAMADOL HCL 50 MG PO TABS
ORAL_TABLET | ORAL | Status: AC
  Filled 2016-12-31: qty 1

## 2016-12-31 MED ORDER — FENTANYL CITRATE (PF) 100 MCG/2ML IJ SOLN
12.5000 ug | INTRAMUSCULAR | Status: DC | PRN
  Administered 2016-12-31 – 2017-01-01 (×3): 25 ug via INTRAVENOUS
  Filled 2016-12-31 (×3): qty 2

## 2016-12-31 MED ORDER — ALTEPLASE 2 MG IJ SOLR: 2.0000 mg | Freq: Once | INTRAMUSCULAR | Status: DC | PRN

## 2016-12-31 MED ORDER — LIDOCAINE HCL (PF) 1 % IJ SOLN: 5.0000 mL | INTRAMUSCULAR | Status: DC | PRN

## 2016-12-31 MED ORDER — HEPARIN SODIUM (PORCINE) 1000 UNIT/ML DIALYSIS: 40.0000 [IU]/kg | Freq: Once | INTRAMUSCULAR | Status: DC

## 2016-12-31 MED ORDER — SODIUM CHLORIDE 0.9 % IV SOLN: 100.0000 mL | INTRAVENOUS | Status: DC | PRN

## 2016-12-31 MED ORDER — DARBEPOETIN ALFA 200 MCG/0.4ML IJ SOSY
PREFILLED_SYRINGE | INTRAMUSCULAR | Status: AC
  Filled 2016-12-31: qty 0.4

## 2016-12-31 MED ORDER — MORPHINE SULFATE (PF) 4 MG/ML IV SOLN
INTRAVENOUS | Status: AC
Start: 2016-12-31 — End: 2016-12-31
  Administered 2016-12-31: 2 mg
  Filled 2016-12-31: qty 1

## 2016-12-31 MED ORDER — LIDOCAINE-PRILOCAINE 2.5-2.5 % EX CREA: 1.0000 "application " | TOPICAL_CREAM | CUTANEOUS | Status: DC | PRN

## 2016-12-31 MED ORDER — ALPRAZOLAM 0.5 MG PO TABS
ORAL_TABLET | ORAL | Status: AC
  Filled 2016-12-31: qty 1

## 2016-12-31 MED ORDER — PENTAFLUOROPROP-TETRAFLUOROETH EX AERO: 1.0000 "application " | INHALATION_SPRAY | CUTANEOUS | Status: DC | PRN

## 2016-12-31 MED ORDER — SODIUM CHLORIDE 0.9 % IV SOLN: Freq: Once | INTRAVENOUS | Status: DC

## 2016-12-31 MED ORDER — HEPARIN SODIUM (PORCINE) 1000 UNIT/ML DIALYSIS: 1000.0000 [IU] | INTRAMUSCULAR | Status: DC | PRN

## 2016-12-31 NOTE — Progress Notes (Signed)
OT Cancellation Note  Patient Details Name: Jill Johnson L Penson MRN: [id number] DOB: 12-28-1940   Cancelled Treatment:    Reason Eval/Treat Not Completed: Patient at procedure or test/ unavailable (currently in HD). Will follow up for OT eval as time allows.  Gaye AlkenBailey A Everlynn Sagun M.S., OTR/L Pager: [phone]  12/31/2016, 8:39 AM

## 2016-12-31 NOTE — Progress Notes (Signed)
Stroke Neurology Note 75-yo woman with numerous significant chronic medical problems now s/p L popliteal stenting for critical limb ischemia. Hospital course further complicated by NSTEMI. She was noted to have some possible weakness of the LUE. This prompted MRI brain which showed a punctate area of restricted diffusion in the L cerebellum  Subjective ; patient is is complaining of pain and states she asked for pain medication and hour ago and did not get it. She is more alert and cooperative today.  . She denies focal extremity weakness and numbness.   Objective Vitals:   12/31/16 1022 12/31/16 1224  BP: (!) 113/43   Pulse: (!) 52 62  Resp: 20   Temp: 97.5 F (36.4 C)   middle aged Caucasian lady.getting hemodialysis. Distal pulses not felt in both lefts. Changes of  Erythema and discoloration in toes left foot. . Afebrile. Head is nontraumatic. Neck is supple without bruit.    Cardiac exam no murmur or gallop. Lungs are clear to auscultation. Distal pulses are well felt. Neurological Exam ;  Awake alert. Oriented 3. Speech and language appear normal. Fundi were not visualized. Eye movements are full range without nystagmus. Blinks to threat bilaterally. Not cooperative for visual field testing. Face is symmetric. Tongue is midline. Motor system exam patient's cooperation is quite limited but she is able to move all 4 extremities against gravity but will not cooperate for detailed exam. I see no focal weakness. Dependent reflexes are present in the upper extremities and depressed in the lower eczema to his. Plantars are downgoing. Diminished touch pinprick sensation in both lower extremity from in stocking distribution.  MRI Brain personally reviewed shows tiny punctate 2 mm peripheral inferior cerebellar cortical hyperdensity which is not dark and corresponding ADC map and is of questionable significance possible artifact Transthoracic echo and carotid Dopplers are both pending LDL 27 mg  percent. Hemoglobin A1c is 4.4 2DEcho normal Ef no cardiac source of embolism Carotid Dopplers no significant stenosis  IMPRESSION : 76 year old lady  Stroke like symptoms of subjective left-sided weakness with neurological exam showing some nonorganic weakness of giveaway pattern-doubt stroke. MRI scan shows tiny superficial lefts inferior cerebellar cortical diffusion hyperintensity which is not dark on corresponding ADC map and is of questionable significance and anyway does not clinically explain possible left-sided weakness. This finding at most may represent a tiny incidental lacunar infarct or an artifact and is not responsible for patient's present clinical symptom. Recommendation ; Palliative care team consulted. Continue aspirin for stroke prevention and stroke team will sign off. Kindly call for questions. Delia HeadyPramod Sethi, MD Medical Director [facility] Pager: [phone] 12/31/2016 1:11 PM

## 2016-12-31 NOTE — Progress Notes (Signed)
CRITICAL VALUE ALERT  Critical value received:  Hemoglobin 6.4  Date of notification:  12/31/16  Time of notification: 0518  Critical value read back: YES  Nurse who received alert:  Erie NoeVanessa, RN  MD notified (1st page):  NP Schorr  Time of first page: [phone]  MD notified (2nd page):  Time of second page:  Responding MD: NP Schorr  Time MD responded:  [phone]

## 2016-12-31 NOTE — Progress Notes (Signed)
No charge note.  Called by RN to bedside.  Patient's HD was cut short due to severe pain.  Patient is crying out in bed with delirium and in pain.  I spoke with her son Bethann BerkshireJohnny (Durable and [facility])  on the phone and described the situation.  We discussed pain medications.  Bethann BerkshireJohnny wants his mother to receive pain medications so that she is comfortable.  We also discussed durable POA and whether or not he would need a letter from an MD to handle her financial affairs.  [name] asked if the MDs would be calling him with updates about his mother.    He is appreciative of the care she is receiving.  Algis DownsMarianne York, [place] JerseyPA-C Palliative Medicine Pager: [phone]

## 2016-12-31 NOTE — Consult Note (Addendum)
WOC Nurse wound consult note Pt is familiar to WOC team from recent admission; refer to notes on 2/10.  Pt has extensive necrotic areas which include dry eschar to all right toes, and areas of right calves.  Please consult vascular team for further plan of care if aggressive treatment is desired; topical treatment will be minimally effective to promote healing related to extensive necrotic areas. Reason for Consult: Consult requested for left leg and left toes Wound type: Vascular insufficiency Left outer calf with full thickness wounds; affected area is approx 13X3cm, 85% dry tightly adhered eschar, 15% red and moist Left inner/posterior calf is approx 7X7cm; 80% dry tightly adhered eschar, 20% red and moist Drainage (amount, consistency, odor) Small amt yellow drainage, no odor Periwound: mild periwound erythema Dressing procedure/placement/frequency: Apply Betadine swab sticks to the left toes with eschar daily in an effort to keep them dry and infection free.  Prevalon Boots to left foot to reduce pressure.  Xeroform gauze to left leg wounds Q day to reduce adherence and discomfort with dressing changes and protect from further injury. EMR indicates the patient is followed by the outpatient wound care center and she can resume follow-up there after discharge. Please re-consult if further assistance is needed.  Thank-you,  Cammie Mcgeeawn Macintyre Alexa MSN, RN, CWOCN, [place], CNS [phone]

## 2016-12-31 NOTE — Plan of Care (Signed)
Problem: Nutrition: Goal: Dietary intake will improve Outcome: Not Progressing Administering Strawberry Ensure in between meals. Patient drinks all of it. Appetite is decreased with solid foods.

## 2016-12-31 NOTE — Progress Notes (Signed)
Patient ID: Jill Johnson, female   DOB: [birth year], 76 y.o.   MRN: [id number]  PROGRESS NOTE    Jill Johnson  SNK:[id number] DOB: Apr 11, 1941 DOA: 12/16/2016  PCP: Jill Smolder, MD   Brief Narrative:  76 year old female with past medical history significant for end-stage renal disease on hemodialysis (TTS) started about 2 months ago, CAD status post CABG and AVR (AV replacement), hypothyroidism, hypertension, recent critical limb ischemia. Initially under cardiology primary care and [facility] consulted for evaluation of delirium. Please note patient under [place] primary care starting 12/22/2016. Patient has had left leg/foot pain for past month or so prior to this admission. She saw Dr. early in clinic and the plan was for outpatient angiography. In short stay, prior to the procedure, patient started to have chest pain and EKG showed new ST depressions. Patient was taken to cath lab on 12/31/2016. She is status post cardiac catheterization with SVG to PDA DES stent placement.  Pt also underwent aortogram with bilateral LE runoff, left popliteal stenting on 12/19/2016.   During this hospital stay pt had problems with infiltration. Per renal, optimal to rest the fistula- ideally should have TDC placement but likely not possible in the setting of DAPT requirement. PCT has met with pt who expressed desire to continue HD. RIJV HD catheter placed 2/16.  More left lower extremity weakness on 2/18 reported by family. MRI brain done and found to have acute infarct in left cerebellar hemisphere. Seen by stroke team. Stroke work up completed.  Barrier to discharge: not tolerating HD inpatient due to left limb pain so would not tolerate outpt HD and therefore she is not yet stable for discharge.    Assessment & Plan:  End-stage renal disease with hemodialysis / Hypokalemia -  Per renal, need to rest the fistula- ideally should have TDC placement but likely not possible in the setting of DAPT  requirement. PCT has met with pt who expressed desire to continue HD - RIJV HD cath placed 2/16 per IR - HD per renal schedule - Correct potassium with HD  - Her left upper arm AV fistula is patent per vascular surgery and she is using her temporary catheter in the right internal jugular vein to allow the hematoma in her fistula to resolve. - Appreciate palliative care team following   Acute ischemic infarct left cerebellar hemisphere - Stroke work up initiated - Continue daily aspirin  - MRI brain - Punctate 5 mm acute ischemic infarct within the peripheral aspect of the inferior left cerebellar hemisphere. No associated hemorrhage or mass effect. - 2D ECHO - EF 60-65%, grade 2 DD - HgbA1c - 4.4 - Lipid panel - 27. LDL goal < 100. Patient on Lipitor 80 mg at bedtime  - Diet: renal diet  - Therapy: PT/OT - SNF recommended on discharge  - Appreciate neurology following - per stroke team no further aggressive stroke work up necessary   Non-ST elevation myocardial infarction  - SVG to PDA stent placement, DES.  - Dual antiplatelet therapy for at least 6 months per cardiology   - Continue aggressive secondary prevention with asa and brilinta as well as atorvastatin, carvedilol  Peripheral vascular disease with critical limb ischemia / Leukocytosis  - Per Dr. Donnetta Hutching pt is very poor surgical candidate - S/P Aortogram with bilateral LE runoff, left popliteal stenting 12/13/2016 by Dr. Sherren Mocha Early  - No symptoms in left leg at rest  Hyperlipidemia - Continue Lipitor 80 mg at bedtime   Bioprosthetic aortic valve  replacement 2011  - Recent echocardiogram in December 2017 showed normal function  Coronary artery disease status post CABG 2011  - SVG to PDA stenting  Anemia of chronic kidney disease - Hgb 6.4 this am - Transfuse 1 U PRBC this am - Continue Aranesp with HD   Hyponatremia - Likely in the setting of ESRD  - Renal following, appreciate their input  - Sodium  127-129  Delirium - Stable, intermittent confusion   Hypothyroidism  - Continue synthroid 112 mcg daily   Bilateral LEs and left foot toes pressure injury  - Left medial LE:  3.5 x 2 with depth obscured by the presence of eschar, no exudate - Left lateral LE:  3cm x 1cm x 0.1cm partial thickness with pink, moist base and scant serous exudate - Right LE (anterior):  2.5cm x 1.5cm x 0.2cm full thickness wound with light yellow, moist wound bed and small amount of serous exudate - Dressing procedure/placement/frequency: Conservative wound care using betadine swab sticks to the left toes with eschar in an effort to keep them dry and infection free.  Prevalon Boots are provided bilaterally to protect from injury.  The LE wounds care twice daily with saline moistened gauze and topped with protective silicone foam.     DVT prophylaxis: Asa, brilinta; heparin subQ  Code Status: DNR/DNI Family Communication: no family at the bedside this am; updated son over the phone daily, spoke with him over the phone this am, will see how pt progresses over next couple of days and then will be able to make the decision which way to go  Disposition Plan: plan to D/C to SNF once cleared by renal    Consultants:   Cardiology   Nephrology   Vascular surgery, Dr. Sherren Mocha Early   Neurology - consulted 2/18 for acute CVA  Palliative care   WOC  Procedures:   ESDR on HD TTS  Aortogram with bilateral LE runoff, left popliteal stenting - 12/14/2016  RIJV HF catheter placed 12/25/16  1 U PRBC transfusion 12/31/16  Antimicrobials:   None    Subjective: No overnight events.   Objective: Vitals:   12/31/16 0900 12/31/16 0930 12/31/16 0942 12/31/16 1000  BP: (!) 141/53 (!) 133/57 (!) 106/37 (!) 115/39  Pulse: (!) 50 (!) 50 (!) 50 (!) 49  Resp: (!) '22 17 20 17  '$ Temp:   97.4 F (36.3 C) 97.5 F (36.4 C)  TempSrc:   Oral   SpO2:      Weight:      Height:        Intake/Output Summary (Last 24  hours) at 12/31/16 1020 Last data filed at 12/31/16 1000  Gross per 24 hour  Intake              918 ml  Output                0 ml  Net              918 ml   Filed Weights   12/30/16 0456 12/31/16 0500 12/31/16 0810  Weight: 69.8 kg (153 lb 14.1 oz) 69.7 kg (153 lb 10.6 oz) 70.5 kg (155 lb 6.8 oz)    Examination:  General exam: No acute distress Respiratory system: Bilateral air entry, no wheezing  Cardiovascular system: S1 & S2 heard, Rate controlled; SEM appreciate 2/6 Gastrointestinal system: (+) BS, non tender, non distended   Central nervous system: disoriented, no focal deficits  Extremities: No tenderness, necrotic toes  Skin:  LLE necrotic wounds on medical aspect, L toes necrotic; LUA AVF Psychiatry: no restlessness, no agitation   Data Reviewed: I have personally reviewed following labs and imaging studies  CBC:  Recent Labs Lab 12/27/16 0418 12/28/16 0431 12/29/16 0658 12/30/16 0905 12/31/16 0352  WBC 10.7* 11.8* 12.3* 18.0* 17.0*  HGB 8.4* 7.5* 7.2* 7.1* 6.4*  HCT 25.6* 22.8* 21.9* 21.4* 19.3*  MCV 98.5 98.7 100.0 100.0 101.0*  PLT 323 326 337 315 914   Basic Metabolic Panel:  Recent Labs Lab 12/27/16 0418 12/28/16 0431 12/29/16 1013 12/30/16 0905 12/31/16 0352 12/31/16 0830  NA 129* 127* 127* 129* 127*  --   K 3.6 4.2 4.5 3.2* 3.3*  --   CL 94* 93* 92* 92* 90*  --   CO2 '26 25 24 26 27  '$ --   GLUCOSE 107* 87 85 85 92  --   BUN 22* 40* 70* 30* 39*  --   CREATININE 2.66* 3.63* 4.66* 2.59* 3.22*  --   CALCIUM 7.8* 7.8* 8.0* 7.8* 7.9*  --   PHOS  --   --  3.8  --   --  3.1   GFR: Estimated Creatinine Clearance: 12.8 mL/min (by C-G formula based on SCr of 3.22 mg/dL (H)). Liver Function Tests:  Recent Labs Lab 12/29/16 1013  AST 32  ALT 7*  ALKPHOS 102  BILITOT 1.1  PROT 4.4*  ALBUMIN 1.6*   No results for input(s): LIPASE, AMYLASE in the last 168 hours. No results for input(s): AMMONIA in the last 168 hours. Coagulation Profile: No  results for input(s): INR, PROTIME in the last 168 hours. Cardiac Enzymes: No results for input(s): CKTOTAL, CKMB, CKMBINDEX, TROPONINI in the last 168 hours. BNP (last 3 results) No results for input(s): PROBNP in the last 8760 hours. HbA1C:  Recent Labs  12/29/16 0658  HGBA1C 4.4*   CBG: No results for input(s): GLUCAP in the last 168 hours. Lipid Profile:  Recent Labs  12/29/16 0658  CHOL 67  HDL 13*  LDLCALC 27  TRIG 136  CHOLHDL 5.2   Thyroid Function Tests: No results for input(s): TSH, T4TOTAL, FREET4, T3FREE, THYROIDAB in the last 72 hours. Anemia Panel:  Recent Labs  12/29/16 1013  TIBC 115*  IRON 75   Urine analysis:    Component Value Date/Time   COLORURINE YELLOW 10/28/2016 1600   APPEARANCEUR CLEAR 10/28/2016 1600   LABSPEC 1.011 10/28/2016 1600   PHURINE 6.5 10/28/2016 1600   GLUCOSEU NEGATIVE 10/28/2016 1600   HGBUR NEGATIVE 10/28/2016 1600   BILIRUBINUR NEGATIVE 10/28/2016 1600   KETONESUR NEGATIVE 10/28/2016 1600   PROTEINUR 30 (A) 10/28/2016 1600   UROBILINOGEN 1.0 01/30/2010 1104   NITRITE NEGATIVE 10/28/2016 1600   LEUKOCYTESUR NEGATIVE 10/28/2016 1600   Sepsis Labs: '@LABRCNTIP'$ (procalcitonin:4,lacticidven:4)    Recent Results (from the past 240 hour(s))  MRSA PCR Screening     Status: None   Collection Time: 01/04/2017  8:18 PM  Result Value Ref Range Status   MRSA by PCR NEGATIVE NEGATIVE Final     Radiology Studies: Mr Brain Wo Contrast Result Date: 12/27/2016 1. Punctate 5 mm acute ischemic infarct within the peripheral aspect of the inferior left cerebellar hemisphere. No associated hemorrhage or mass effect. 2. Generalized cerebral atrophy with moderate chronic microvascular ischemic disease and multiple remote infarcts as detailed above. 3. Few scattered foci of susceptibility artifact involving the supratentorial brain and right cerebellar hemisphere, consistent with small chronic micro hemorrhages. These are most commonly  related to chronic underlying  hypertension. 4. Apparent asymmetric enlargement of the partially visualized left parotid gland, indeterminate. Correlation with physical exam for possible acute parotitis recommended.   Ir Fluoro Guide Cv Line Right Result Date: 12/25/2016 Successful temporary right jugular dialysis catheter placement with its tip at the cavoatrial junction.   Ir US Guide Vasc Access Right Result Date: 12/25/2016 Successful temporary right jugular dialysis catheter placement with its tip at the cavoatrial junction.  Dg Chest Port 1 View Result Date: 12/19/2016 Cardiomegaly with central vascular congestion. Possible small left effusion. No overt pulmonary edema.    Scheduled Meds: . acetaminophen  1,000 mg Oral TID  . aspirin  81 mg Oral Daily  . atorvastatin  80 mg Oral q1800  . carvedilol  3.125 mg Oral BID WC  .  ceFAZolin IV  1 g Intravenous To OR  . cholecalciferol  2,000 Units Oral Daily  . gabapentin  200 mg Oral BID  . mouth rinse  15 mL Mouth Rinse BID  . sucroferric oxyhydroxide  500 mg Oral TID WC  . ticagrelor  90 mg Oral BID  . vitamin B-12  1,500 mcg Oral Daily   Continuous Infusions: . sodium chloride 10 mL/hr at 12/27/2016 1235     LOS: 13 days    Time spent: 25 minutes  Greater than 50% of the time spent on counseling and coordinating the care.   Leisa Lenz, MD [facility] Pager [phone]  If 7PM-7AM, please contact night-coverage [email or web address] Password [email or web address] 12/31/2016, 10:20 AM

## 2016-12-31 NOTE — Progress Notes (Signed)
Daily Progress Note   Patient Name: Jill Johnson       Date: 12/31/2016 DOB: Feb 18, 1941  Age: 76 y.o. MRN#: [id number] Attending Physician: Robbie Lis, MD Primary Care Physician: Marjorie Smolder, MD Admit Date: 12/24/2016  Reason for Consultation/Follow-up: Establishing goals of care given NSTEMI, critical limb ischemia, CVA, ESRD and prolonged altered mental status.  Subjective: Met with sons [name] and [name] as well as patient's sister Levander Campion.  [name] and [name] were very specific.  They would like for providers to only give updates to them - not other family members or friends.  We discussed at length - the status of her heart, kidney's and left foot. We discussed today's drop in hgb and the concern that she is bleeding but also needs to be on antiplatelets and anticoagulants.  She has not been out of bed in 2 months and now is most certainly bed bound.   We really focused on the patient's delirium.  It was intermittent even prior to admission, but has been continuous over the past two weeks.  The sons discussed the very steep decline they have witnessed over the last 2 months.  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  Her sister feels that she would not want to be like this - it is very hard on [place] and very hard on the family.  We discussed the advanced directive that [place] completed not long ago.  The family stated that she did not want her life artificially prolonged.   I explained that we have entered the realm of artificially prolonging her life with blood transfusions and hemodialysis.  My best medical recommendation to the family at this point was to make their mother comfortable and take advantage of Hospice House.  The sons agree that they are unable to care for their mother at home.  They also agree that  it appears she is near end of life - - however they are hesitant to make the decision to stop hemodialysis and go to hospice house until they feel absolutely certain.  [name] would like to give his mother another few days to (1) see if the delirium clears with continued HD and on zyprexa.  (2) see if she can tolerate hemodialysis sitting in a chair as though she were outpatient.  I feel the family will make the decision  for [facility] but they need more time and confirmation from her medical providers that this is most medically appropriate.   Patient Profile/HPI:  76 y.o. female  with past medical history of ESRD (on HD), HLD, HTN, CAD s/p CABG (2011), TIA, CVA, critical limb ischemia admitted on 12/24/2016 with chest pain. She had presented for scheduled lower limb angiography, however, before the procedure she was found to have chest pain and EKG revealed ST depression. She is now s/p cardiac cath with stent placement. She has continued to have altered mental status and a small stroke was found on MRI.  She is tolerating HD, but is bradycardic and lethargic. Overnight 2/22 the patient's hgb dropped to 6.4 and a blood transfusion was ordered.  Length of Stay: 13  Current Medications: Scheduled Meds:  . sodium chloride   Intravenous Once  . acetaminophen  1,000 mg Oral TID  . ALPRAZolam      . aspirin  81 mg Oral Daily  . atorvastatin  80 mg Oral q1800  . carvedilol  3.125 mg Oral BID WC  . darbepoetin (ARANESP) injection - DIALYSIS  200 mcg Intravenous Q Thu-HD  . diclofenac sodium  2 g Topical TID  . docusate sodium  100 mg Oral BID  . feeding supplement (PRO-STAT SUGAR FREE 64)  30 mL Oral BID  . gabapentin  200 mg Oral BID  . heparin  5,000 Units Subcutaneous Q8H  . levothyroxine  112 mcg Oral QAC breakfast  . lidocaine  1 application Topical TID  . multivitamin  1 tablet Oral QHS  . OLANZapine zydis  5 mg Oral QHS  . polyethylene glycol  17 g Oral BID  . senna  1 tablet Oral  BID  . sodium chloride flush  3 mL Intravenous Q12H  . sucroferric oxyhydroxide  1,000 mg Oral TID WC  . ticagrelor  90 mg Oral BID  . traMADol      . vitamin B-12  1,500 mcg Oral Daily    Continuous Infusions: . sodium chloride 10 mL/hr at 12/11/2016 1235    PRN Meds: sodium chloride, acetaminophen, ALPRAZolam, dextromethorphan-guaiFENesin, gi cocktail, lidocaine, nitroGLYCERIN, ondansetron (ZOFRAN) IV, senna-docusate, sodium chloride flush, traMADol  Physical Exam      Vital Signs: BP (!) 121/45   Pulse (!) 52   Temp 97.7 F (36.5 C) (Oral)   Resp 18   Ht 5' (1.524 m)   Wt 70.5 kg (155 lb 6.8 oz) Comment: Bed Scale  SpO2 98%   BMI 30.35 kg/m  SpO2: SpO2: 98 % O2 Device: O2 Device: Not Delivered O2 Flow Rate: O2 Flow Rate (L/min): 2 L/min  Intake/output summary:   Intake/Output Summary (Last 24 hours) at 12/31/16 0856 Last data filed at 12/30/16 2200  Gross per 24 hour  Intake              600 ml  Output                0 ml  Net              600 ml   LBM: Last BM Date: 12/30/16 Baseline Weight: Weight: 71.7 kg (158 lb) Most recent weight: Weight: 70.5 kg (155 lb 6.8 oz) (Bed Scale)       Palliative Assessment/Data:    Flowsheet Rows   Flowsheet Row Most Recent Value  Intake Tab  Referral Department  Hospitalist  Unit at Time of Referral  Cardiac/Telemetry Unit  Palliative Care Primary Diagnosis  Other (Comment)  Date Notified  12/22/16  Palliative Care Type  New Palliative care  Reason for referral  Clarify Goals of Care  Date of Admission  12/21/2016  # of days IP prior to Palliative referral  4  Clinical Assessment  Palliative Performance Scale Score  20%  Psychosocial & Spiritual Assessment  Palliative Care Outcomes  Patient/Family meeting held?  Yes  Who was at the meeting?  2 sons, sister, dtr in law  Palliative Care Outcomes  Clarified goals of care, Counseled regarding hospice      Patient Active Problem List   Diagnosis Date Noted  .  Cerebral thrombosis with cerebral infarction 12/29/2016  . Palliative care by specialist   . Palliative care encounter   . Other specified hypothyroidism   . Goals of care, counseling/discussion   . Advance care planning   . Unstable angina ([place]) 01/02/2017  . Anemia, chronic disease 12/24/2016  . ESRD on hemodialysis ([place]) 12/12/2016  . PAD (peripheral artery disease) ([place]) 12/27/2016  . Critical lower limb ischemia 12/19/2016  . Coronary artery disease involving native coronary artery of native heart with unstable angina pectoris ([place]) 01/06/2017  . Chest pain 10/28/2016  . Fluid overload 10/28/2016  . Elevated troponin 10/28/2016  . Symptomatic anemia 10/28/2016  . ESRD (end stage renal disease) ([place]) 10/28/2016  . Hypothyroidism 10/28/2016  . Respiratory distress 10/28/2016  . Acute diastolic heart failure ([place]) 12/20/2015  . Obesity 12/20/2015  . Coronary artery disease due to lipid rich plaque 12/20/2015  . S/P AVR (aortic valve replacement) 12/20/2015  . Chronic kidney disease, stage 3 12/20/2015    Palliative Care Plan    Recommendations/Plan: Continue full scope treatment while family is making decisions.   Is she bleeding on antiplatelet therapy and dvt prophylaxis?   Will her delirium clear?   Can she tolerate outpatient dialysis?  Providers please speak directly with two sons - Ellsworth Lennox - not the rest of the family/ friends.  Delirium:  Zyprexa started 2/21  PMT will continue to follow patient, and update/engage family in [place] decision making process   Goals of Care and Additional Recommendations:  Limitations on Scope of Treatment: Full Scope Treatment  Code Status:  DNR  Prognosis:   Unable to determine pending [place] discussion.  If the patient continues HD she likely has weeks to months.  If it is determined that she will stop HD she has less than 2 weeks.   Discharge Planning:  To Be Determined  SNF vs Hospice House.  Care plan was discussed  with Sons, sister, dtr in law and bedside RN  Thank you for allowing the Palliative Medicine Team to assist in the care of this patient.  Total time spent:  60 min.      Greater than 50%  of this time was spent counseling and coordinating care related to the above assessment and plan.  Imogene Burn, PA-C Palliative Medicine  Please contact Palliative MedicineTeam phone at [phone] for questions and concerns between 7 am - 7 pm.   Please see AMION for individual provider pager numbers.

## 2016-12-31 NOTE — Progress Notes (Signed)
[facility] Progress Note   Subjective: on dialysis, moaning and crying out continuously, "please stop", " it hurts", " I don't understand this"  Vitals:   12/31/16 0830 12/31/16 0900 12/31/16 0930 12/31/16 0942  BP: (!) 121/45 (!) 141/53 (!) 133/57 (!) 106/37  Pulse: (!) 52 (!) 50 (!) 50 (!) 50  Resp: 18 (!) 22 17 20   Temp:    97.4 F (36.3 C)  TempSrc:    Oral  SpO2:      Weight:      Height:        Inpatient medications: . sodium chloride   Intravenous Once  . acetaminophen  1,000 mg Oral TID  . ALPRAZolam      . aspirin  81 mg Oral Daily  . atorvastatin  80 mg Oral q1800  . carvedilol  3.125 mg Oral BID WC  . Darbepoetin Alfa      . darbepoetin (ARANESP) injection - DIALYSIS  200 mcg Intravenous Q Thu-HD  . diclofenac sodium  2 g Topical TID  . docusate sodium  100 mg Oral BID  . feeding supplement (PRO-STAT SUGAR FREE 64)  30 mL Oral BID  . gabapentin  200 mg Oral BID  . heparin  40 Units/kg Dialysis Once in dialysis  . heparin  5,000 Units Subcutaneous Q8H  . levothyroxine  112 mcg Oral QAC breakfast  . lidocaine  1 application Topical TID  . multivitamin  1 tablet Oral QHS  . OLANZapine zydis  5 mg Oral QHS  . polyethylene glycol  17 g Oral BID  . senna  1 tablet Oral BID  . sodium chloride flush  3 mL Intravenous Q12H  . sucroferric oxyhydroxide  1,000 mg Oral TID WC  . ticagrelor  90 mg Oral BID  . traMADol      . vitamin B-12  1,500 mcg Oral Daily   . sodium chloride 10 mL/hr at 06/28/17 1235   sodium chloride, sodium chloride, sodium chloride, acetaminophen, ALPRAZolam, alteplase, dextromethorphan-guaiFENesin, gi cocktail, heparin, lidocaine (PF), lidocaine, lidocaine-prilocaine, nitroGLYCERIN, ondansetron (ZOFRAN) IV, pentafluoroprop-tetrafluoroeth, senna-docusate, sodium chloride flush, traMADol  Exam: General appearance: cooperative, moderately obese, pale and confused Neck: cooperative, moderately obese, pale and RIJ cath Resp:  diminished breath sounds bilaterally Cardio: S1, S2 normal and systolic murmur: systolic ejection 3/6, decrescendo at 2nd left intercostal space GI: obese, pos bs, mild distension Extremities: AVF LUA with Bruise, cool RLE, L worse, blisters on calf, gangrenous toes   Dialysis: TTS NW 4h  67kg   2/2.25 bath  Hep 2000  LUA AVF   16ga needles (does not want 15 ga)  - darbe 200/ wk last 2/8  - venofer 100 mg/ hd, one dose left  - last Hb 8, Ca 9, P 5.8, pth 251  - tums 1 ac, cozaar 50 hs       Assessment: 1. ESRD not tolerating HD today, signed off 90 min into Rx 2. Confusion - multifact, dementia/ pain/ meds 3. PVD / dry gangrene left foot 4. DM ok 5. Anemia esa, Fe ok 6. HPTH vit D 7. Dispo - not tolerating HD here and so not candidate for OP HD  Plan - as above   Vinson Moselleob Vickye Astorino MD [facility] pager [phone]   12/31/2016, 9:55 AM    Recent Labs Lab 12/29/16 1013 12/30/16 0905 12/31/16 0352 12/31/16 0830  NA 127* 129* 127*  --   K 4.5 3.2* 3.3*  --   CL 92* 92* 90*  --  CO2 24 26 27   --   GLUCOSE 85 85 92  --   BUN 70* 30* 39*  --   CREATININE 4.66* 2.59* 3.22*  --   CALCIUM 8.0* 7.8* 7.9*  --   PHOS 3.8  --   --  3.1    Recent Labs Lab 12/29/16 1013  AST 32  ALT 7*  ALKPHOS 102  BILITOT 1.1  PROT 4.4*  ALBUMIN 1.6*    Recent Labs Lab 12/29/16 0658 12/30/16 0905 12/31/16 0352  WBC 12.3* 18.0* 17.0*  HGB 7.2* 7.1* 6.4*  HCT 21.9* 21.4* 19.3*  MCV 100.0 100.0 101.0*  PLT 337 315 299   Iron/TIBC/Ferritin/ %Sat    Component Value Date/Time   IRON 75 12/29/2016 1013   TIBC 115 (L) 12/29/2016 1013   FERRITIN 387 (H) 10/20/2016 0929   IRONPCTSAT 65 (H) 12/29/2016 1013

## 2016-12-31 NOTE — Progress Notes (Signed)
Responded to Spiritual Care consult to visit with patient.  During visit patient said she wasn't feeling well today. Patient was awake but appeared to me to be a little drowsy and confused.  I offered blessings upon her and allowed her to rest.  Nurse will page if further chaplain care is needed.  Per nurse sons wants to wait a couple of days to see if new pain medicine will help before any further decision regarding how to proceed.  Venida JarvisWatlington, Eason Housman, Chaplain,pager [phone]

## 2016-12-31 NOTE — Plan of Care (Signed)
Problem: Education: Goal: Knowledge of disease or condition will improve Outcome: Not Progressing PT confused

## 2017-01-01 DIAGNOSIS — Z515 Encounter for palliative care: Secondary | ICD-10-CM

## 2017-01-01 DIAGNOSIS — Z7189 Other specified counseling: Secondary | ICD-10-CM

## 2017-01-01 LAB — TYPE AND SCREEN
ABO/RH(D): A POS
Antibody Screen: NEGATIVE
Unit division: 0

## 2017-01-01 LAB — BASIC METABOLIC PANEL
Anion gap: 9 (ref 5–15)
BUN: 32 mg/dL — AB (ref 6–20)
CO2: 25 mmol/L (ref 22–32)
Calcium: 7.5 mg/dL — ABNORMAL LOW (ref 8.9–10.3)
Chloride: 92 mmol/L — ABNORMAL LOW (ref 101–111)
Creatinine, Ser: 2.76 mg/dL — ABNORMAL HIGH (ref 0.44–1.00)
GFR calc Af Amer: 18 mL/min — ABNORMAL LOW (ref >60)
GFR, EST NON AFRICAN AMERICAN: 16 mL/min — AB (ref >60)
GLUCOSE: 75 mg/dL (ref 65–99)
POTASSIUM: 3.3 mmol/L — AB (ref 3.5–5.1)
SODIUM: 126 mmol/L — AB (ref 135–145)

## 2017-01-01 LAB — CBC
HCT: 22.9 % — ABNORMAL LOW (ref 36.0–46.0)
Hemoglobin: 7.8 g/dL — ABNORMAL LOW (ref 12.0–15.0)
MCH: 33.3 pg (ref 26.0–34.0)
MCHC: 34.1 g/dL (ref 30.0–36.0)
MCV: 97.9 fL (ref 78.0–100.0)
PLATELETS: 235 10*3/uL (ref 150–400)
RBC: 2.34 MIL/uL — ABNORMAL LOW (ref 3.87–5.11)
RDW: 21.1 % — ABNORMAL HIGH (ref 11.5–15.5)
WBC: 14.5 10*3/uL — ABNORMAL HIGH (ref 4.0–10.5)

## 2017-01-01 MED ORDER — SODIUM CHLORIDE 0.9% FLUSH: 3.0000 mL | INTRAVENOUS | Status: DC | PRN

## 2017-01-01 MED ORDER — SODIUM CHLORIDE 0.9 % IV SOLN: 250.0000 mL | INTRAVENOUS | Status: DC | PRN

## 2017-01-01 MED ORDER — BIOTENE DRY MOUTH MT LIQD: 15.0000 mL | OROMUCOSAL | Status: DC | PRN

## 2017-01-01 MED ORDER — SODIUM CHLORIDE 0.9% FLUSH
3.0000 mL | Freq: Two times a day (BID) | INTRAVENOUS | Status: DC
  Administered 2017-01-01 – 2017-01-03 (×5): 3 mL via INTRAVENOUS

## 2017-01-01 MED ORDER — FENTANYL CITRATE (PF) 100 MCG/2ML IJ SOLN
25.0000 ug | INTRAMUSCULAR | Status: DC | PRN
  Administered 2017-01-01 (×2): 25 ug via INTRAVENOUS
  Administered 2017-01-02 (×2): 50 ug via INTRAVENOUS
  Administered 2017-01-02: 25 ug via INTRAVENOUS
  Administered 2017-01-02: 50 ug via INTRAVENOUS
  Administered 2017-01-02: 25 ug via INTRAVENOUS
  Administered 2017-01-03 (×2): 50 ug via INTRAVENOUS
  Administered 2017-01-03: 25 ug via INTRAVENOUS
  Administered 2017-01-03 – 2017-01-04 (×3): 50 ug via INTRAVENOUS
  Filled 2017-01-01 (×14): qty 2

## 2017-01-01 MED ORDER — GLYCOPYRROLATE 0.2 MG/ML IJ SOLN
0.2000 mg | INTRAMUSCULAR | Status: DC | PRN
  Administered 2017-01-03 – 2017-01-05 (×3): 0.2 mg via INTRAVENOUS
  Filled 2017-01-01 (×2): qty 1

## 2017-01-01 MED ORDER — GLYCOPYRROLATE 0.2 MG/ML IJ SOLN: 0.2000 mg | INTRAMUSCULAR | Status: DC | PRN

## 2017-01-01 MED ORDER — GLYCOPYRROLATE 1 MG PO TABS
1.0000 mg | ORAL_TABLET | ORAL | Status: DC | PRN
  Filled 2017-01-01: qty 1

## 2017-01-01 MED ORDER — HALOPERIDOL LACTATE 2 MG/ML PO CONC
2.0000 mg | Freq: Four times a day (QID) | ORAL | Status: DC | PRN
  Filled 2017-01-01: qty 1

## 2017-01-01 MED ORDER — ACETAMINOPHEN 500 MG PO TABS: 1000.0000 mg | ORAL_TABLET | Freq: Three times a day (TID) | ORAL | Status: DC | PRN

## 2017-01-01 MED ORDER — POLYVINYL ALCOHOL 1.4 % OP SOLN
1.0000 [drp] | Freq: Four times a day (QID) | OPHTHALMIC | Status: DC | PRN
  Filled 2017-01-01: qty 15

## 2017-01-01 MED ORDER — HALOPERIDOL LACTATE 5 MG/ML IJ SOLN: 1.0000 mg | Freq: Four times a day (QID) | INTRAMUSCULAR | Status: DC | PRN

## 2017-01-01 NOTE — Evaluation (Signed)
Occupational Therapy Evaluation Patient Details Name: Jill Johnson MRN: [id number] DOB: 1941-10-15 Today's Date: 01/01/2017    History of Present Illness Patient is a 76 y/o female with hx of HTN, HLD, CKD, CAD s/p CABG, AVR, depression, severe PVD with critical limb ischemia of left foot with ulcerated wounds, ESRD on HD, hypothyroidiam admitted for outpatient angiogram. In short stay, pt with chest pain. Found to have NSTEMI s/p cardiac catheterization with SVG to PDA DES stent placement. 2/18 Lt cerebellar CVA without    Clinical Impression   Per PT eval; pt dependent with ADL PTA. Currently pt max-total assist for ADL and bed mobility. Attempted sit to stand from EOB with total assist +2; pt with knee flexion throughout and unable to obtain upright posture. Recommending SNF for follow up to maximize independence and safety with ADL and functional mobility. Pt would benefit from continued skilled OT to address established goals.    Follow Up Recommendations  SNF;Supervision/Assistance - 24 hour    Equipment Recommendations  Other (comment) (TBD at next venue)    Recommendations for Other Services       Precautions / Restrictions Precautions Precautions: Fall Restrictions Weight Bearing Restrictions: No      Mobility Bed Mobility Overal bed mobility: Needs Assistance Bed Mobility: Supine to Sit;Sit to Supine     Supine to sit: Max assist;+2 for physical assistance Sit to supine: Total assist;+2 for physical assistance   General bed mobility comments: Cues throughout for sequencing and hand placement.   Transfers Overall transfer level: Needs assistance Equipment used: 2 person hand held assist Transfers: Sit to/from Stand Sit to Stand: Total assist;+2 physical assistance         General transfer comment: Total assist +2 to attempt sit to stand from EOB x1. Pt unable to come to full upright position; knees flexed throughout.    Balance Overall balance assessment:  Needs assistance Sitting-balance support: Feet supported;Bilateral upper extremity supported Sitting balance-Leahy Scale: Poor Sitting balance - Comments: L lateral and posterior lean in sitting. Mod to max assist thorughout for sitting balance. Postural control: Left lateral lean;Posterior lean   Standing balance-Leahy Scale: Zero                              ADL Overall ADL's : Needs assistance/impaired     Grooming: Moderate assistance;Bed level;Wash/dry face Grooming Details (indicate cue type and reason): Cues to initiate and for sequencing.         Upper Body Dressing : Maximal assistance;Sitting   Lower Body Dressing: Total assistance;Bed level       Toileting- Clothing Manipulation and Hygiene: Total assistance;+2 for physical assistance;Bed level               Vision   Additional Comments: pt would not open eyes throughout session     Perception     Praxis      Pertinent Vitals/Pain Pain Assessment: Faces Faces Pain Scale: Hurts even more Pain Location: LLE Pain Descriptors / Indicators: Grimacing;Sore Pain Intervention(s): Limited activity within patient's tolerance;Monitored during session;Repositioned     Hand Dominance     Extremity/Trunk Assessment Upper Extremity Assessment Upper Extremity Assessment: Generalized weakness   Lower Extremity Assessment Lower Extremity Assessment: Defer to PT evaluation   Cervical / Trunk Assessment Cervical / Trunk Assessment: Kyphotic   Communication     Cognition Arousal/Alertness:  (eyes closed throughout session but answering questions) Behavior During Therapy: [facility] for tasks assessed/performed  Overall Cognitive Status: No family/caregiver present to determine baseline cognitive functioning                 General Comments: oriented to person, place, and time   General Comments       Exercises       Shoulder Instructions      Home Living Family/patient expects to be  discharged to:: Skilled nursing facility                                        Prior Functioning/Environment Level of Independence: Needs assistance  Gait / Transfers Assistance Needed: Pt recently d/ced from SNF 1/15 and was ambulating with RW. once pt got home, she stopped moving per family. ADL's / Homemaking Assistance Needed: total A for ADls.            OT Problem List: Decreased strength;Decreased range of motion;Decreased activity tolerance;Impaired balance (sitting and/or standing);Decreased coordination;Decreased cognition;Decreased safety awareness;Decreased knowledge of use of DME or AE;Decreased knowledge of precautions;Impaired UE functional use;Pain      OT Treatment/Interventions: Self-care/ADL training;Therapeutic exercise;Energy conservation;DME and/or AE instruction;Therapeutic activities;Cognitive remediation/compensation;Patient/family education;Balance training    OT Goals(Current goals can be found in the care plan section) Acute Rehab OT Goals Patient Stated Goal: none stated OT Goal Formulation: With patient Time For Goal Achievement: 01/15/17 Potential to Achieve Goals: Fair ADL Goals Pt Will Perform Grooming: with min guard assist;sitting Pt Will Transfer to Toilet: with mod assist;stand pivot transfer;bedside commode Pt Will Perform Toileting - Clothing Manipulation and hygiene: with min assist;sit to/from stand Additional ADL Goal #1: Pt will demonstrate sustained attention x5 minutes to ADL.  OT Frequency: Min 2X/week   Barriers to D/C:            Co-evaluation PT/OT/SLP Co-Evaluation/Treatment: Yes Reason for Co-Treatment: Complexity of the patient's impairments (multi-system involvement);For patient/therapist safety   OT goals addressed during session: ADL's and self-care      End of Session Equipment Utilized During Treatment: Gait belt Nurse Communication: Mobility status  Activity Tolerance: Patient limited by  lethargy;Patient limited by pain Patient left: in bed;with call bell/phone within reach;with bed alarm set  OT Visit Diagnosis: Unsteadiness on feet (R26.81);Other abnormalities of gait and mobility (R26.89);Muscle weakness (generalized) (M62.81);Other symptoms and signs involving cognitive function;Pain Pain - Right/Left: Left Pain - part of body: Ankle and joints of foot                ADL either performed or assessed with clinical judgement  Time: 6045-4098 OT Time Calculation (min): 25 min Charges:  OT General Charges $OT Visit: 1 Procedure OT Evaluation $OT Eval Moderate Complexity: 1 Procedure G-Codes:     Ardith Test A. Brett Albino, M.S., OTR/L Pager: [phone]  Gaye Alken 01/01/2017, 3:43 PM

## 2017-01-01 NOTE — Progress Notes (Addendum)
Daily Progress Note   Patient Name: Jill Johnson       Date: 01/01/2017 DOB: March 09, 1941  Age: 76 y.o. MRN#: [id number] Attending Physician: Caren Griffins, MD Primary Care Physician: Marjorie Smolder, MD Admit Date: 12/29/2016  Reason for Consultation/Follow-up: Establishing goals of care given NSTEMI, critical limb ischemia, CVA, ESRD and prolonged altered mental status.  Subjective: Patient sleeping soundly.  I did not disturb her. I spoke with the bedside RN who told me that when Jill Johnson was awake she was crying out and in a lot of pain. Further she has eaten a minimal amount and is unable to take her oral medications because she is pocketing them in her mouth and not swallowing.  I spoke with Drs Cruzita Lederer and Melvia Heaps who feel that Jill Johnson is unlikely to recover and not an outpatient HD candidate.    I called her son Jill Johnson [phone]).  We discussed her current status.  Later in the day I met with the [place] family including Jill Johnson, Jill Johnson, and Jill Johnson.  Her situation was discussed again at length.  Jody in particular is having a very difficult time letting go.  At the end of the conversation the decision was made to move her to 6N and focus on her comfort.  Previously the family had been very resistant to using comfort meds but at this time both sons agreed that her comfort is top priority - do not let her be in pain, do not let her be afraid.  They would rather her be knocked out than afraid or in pain.     Jill Johnson has been having significant issues with delirium and pain (particularly in her left foot) it would be very difficult to transport her to hospice at this point (for both the patient and the family).   I have discussed this with Dr. Cruzita Lederer.  If the patient is  stable and appropriate on Monday the family will most likely be willing to consider Hospice House.  Patient Profile/HPI:  76 y.o. female  with past medical history of ESRD (on HD), HLD, HTN, CAD s/p CABG (2011), TIA, CVA, critical limb ischemia admitted on 01/02/2017 with chest pain. She had presented for scheduled lower limb angiography, however, before the procedure she was found to have chest pain and EKG revealed ST depression.  She is now s/p cardiac cath with stent placement. She has continued to have altered mental status and a small stroke was found on MRI.  She is tolerating HD, but is bradycardic and lethargic. Overnight 2/22 the patient's hgb dropped to 6.4 and a blood transfusion was ordered.  Length of Stay: 14  Current Medications: Scheduled Meds:  . sodium chloride   Intravenous Once  . acetaminophen  1,000 mg Oral TID  . aspirin  81 mg Oral Daily  . atorvastatin  80 mg Oral q1800  . carvedilol  3.125 mg Oral BID WC  . darbepoetin (ARANESP) injection - DIALYSIS  200 mcg Intravenous Q Thu-HD  . diclofenac sodium  2 g Topical TID  . docusate sodium  100 mg Oral BID  . feeding supplement (PRO-STAT SUGAR FREE 64)  30 mL Oral BID  . heparin  5,000 Units Subcutaneous Q8H  . levothyroxine  112 mcg Oral QAC breakfast  . lidocaine  1 application Topical TID  . multivitamin  1 tablet Oral QHS  . OLANZapine zydis  5 mg Oral QHS  . senna  1 tablet Oral BID  . sodium chloride flush  3 mL Intravenous Q12H  . sucroferric oxyhydroxide  1,000 mg Oral TID WC  . ticagrelor  90 mg Oral BID  . vitamin B-12  1,500 mcg Oral Daily    Continuous Infusions: . sodium chloride 10 mL/hr at 12/29/2016 1235    PRN Meds: sodium chloride, acetaminophen, ALPRAZolam, dextromethorphan-guaiFENesin, fentaNYL (SUBLIMAZE) injection, gi cocktail, lidocaine, nitroGLYCERIN, ondansetron (ZOFRAN) IV, senna-docusate, sodium chloride flush, traMADol  Physical Exam     Well developed elderly female.  Sleeping.   Poor color. CV rrr,  Resp no distress Abdomen soft.  Vital Signs: BP 109/73   Pulse 69   Temp 98 F (36.7 C) (Oral)   Resp 14   Ht 5' (1.524 m)   Wt 72.1 kg (158 lb 14.4 oz)   SpO2 99%   BMI 31.03 kg/m  SpO2: SpO2: 99 % O2 Device: O2 Device: Not Delivered O2 Flow Rate: O2 Flow Rate (L/min): 2 L/min  Intake/output summary:  No intake or output data in the 24 hours ending 01/01/17 1214 LBM: Last BM Date: 12/30/16 Baseline Weight: Weight: 71.7 kg (158 lb) Most recent weight: Weight: 72.1 kg (158 lb 14.4 oz)       Palliative Assessment/Data:    Flowsheet Rows   Flowsheet Row Most Recent Value  Intake Tab  Referral Department  Hospitalist  Unit at Time of Referral  Cardiac/Telemetry Unit  Palliative Care Primary Diagnosis  Other (Comment)  Date Notified  12/22/16  Palliative Care Type  New Palliative care  Reason for referral  Clarify Goals of Care  Date of Admission  01/04/2017  # of days IP prior to Palliative referral  4  Clinical Assessment  Palliative Performance Scale Score  20%  Psychosocial & Spiritual Assessment  Palliative Care Outcomes  Patient/Family meeting held?  Yes  Who was at the meeting?  2 sons, sister, dtr in law  Palliative Care Outcomes  Clarified goals of care, Counseled regarding hospice      Patient Active Problem List   Diagnosis Date Noted  . Acute delirium   . Hypokalemia   . Cerebral thrombosis with cerebral infarction 12/29/2016  . Palliative care by specialist   . Palliative care encounter   . Other specified hypothyroidism   . Goals of care, counseling/discussion   . Advance care planning   . Unstable angina ([place]) 01/06/2017  .  Anemia, chronic disease 12/17/2016  . ESRD on hemodialysis ([place]) 12/31/2016  . PAD (peripheral artery disease) ([place]) 12/17/2016  . Critical lower limb ischemia 12/25/2016  . Coronary artery disease involving native coronary artery of native heart with unstable angina pectoris ([place]) 01/04/2017  . Chest  pain 10/28/2016  . Fluid overload 10/28/2016  . Elevated troponin 10/28/2016  . Symptomatic anemia 10/28/2016  . ESRD (end stage renal disease) ([place]) 10/28/2016  . Hypothyroidism 10/28/2016  . Respiratory distress 10/28/2016  . Acute diastolic heart failure ([employer]) 12/20/2015  . Obesity 12/20/2015  . Coronary artery disease due to lipid rich plaque 12/20/2015  . S/P AVR (aortic valve replacement) 12/20/2015  . Chronic kidney disease, stage 3 12/20/2015    Palliative Care Plan    Recommendations/Plan: Move to 6N.  Discontinue interventions and medications that do not contribute to comfort.   Reassess over the weekend. I will ask a PMT provider to check the patient for symptoms over the weekend. Providers please speak directly with two sons - Jill Johnson and [place] Delirium:  Zyprexa started 2/21.  Haldol PRN Pain/dyspnea:  Fentanyl PRN Secretions:  Robinul PRN  Goals of Care and Additional Recommendations:  Limitations on Scope of Treatment: Full Comfort Care  Code Status:  DNR  Prognosis:   Hours - Days given renal failure d/c'ing HD, prolonged delirium, CVA, CAD   Discharge Planning:  Anticipated Hospital Death vs possible transfer to hospice house if stable and comfortable on Monday.    Care plan was discussed with [facility] MD, Renal MD, [place] [facility] and bedside RN  Thank you for allowing the Palliative Medicine Team to assist in the care of this patient.  Total time spent:  90 min.  10 am - 11 am 5 pm - 5:30 pm    Greater than 50%  of this time was spent counseling and coordinating care related to the above assessment and plan.  Imogene Burn, PA-C Palliative Medicine  Please contact Palliative MedicineTeam phone at [phone] for questions and concerns between 7 am - 7 pm.   Please see AMION for individual provider pager numbers.

## 2017-01-01 NOTE — Clinical Social Work Note (Signed)
CSW stopped by son Augusto GambleJody in the hallway. Augusto GambleJody states that the family is meeting this evening to make final decisions on the direction they want to go. It seems that the [facility] will likely [facility]. CSW asked that the family notify RN and charge RN of their decision so that weekend CSW can make arrangements. Handoff left for weekend CSW.  Roddie McBryant Ivar Domangue MSW, Hartford[place]CSW, [place] Ridge[place]ASA, [phone]

## 2017-01-01 NOTE — Progress Notes (Signed)
[facility] Progress Note   Subjective: groggy, minimal verbal interaction  Vitals:   01/01/17 0806 01/01/17 0830 01/01/17 1004 01/01/17 1115  BP: (!) 192/81 (!) 171/118 (!) 162/49 109/73  Pulse:      Resp: 16 18 13 14   Temp:      TempSrc:      SpO2:      Weight:      Height:        Inpatient medications: . sodium chloride   Intravenous Once  . acetaminophen  1,000 mg Oral TID  . aspirin  81 mg Oral Daily  . atorvastatin  80 mg Oral q1800  . carvedilol  3.125 mg Oral BID WC  . darbepoetin (ARANESP) injection - DIALYSIS  200 mcg Intravenous Q Thu-HD  . diclofenac sodium  2 g Topical TID  . docusate sodium  100 mg Oral BID  . feeding supplement (PRO-STAT SUGAR FREE 64)  30 mL Oral BID  . heparin  5,000 Units Subcutaneous Q8H  . levothyroxine  112 mcg Oral QAC breakfast  . lidocaine  1 application Topical TID  . multivitamin  1 tablet Oral QHS  . OLANZapine zydis  5 mg Oral QHS  . senna  1 tablet Oral BID  . sodium chloride flush  3 mL Intravenous Q12H  . sucroferric oxyhydroxide  1,000 mg Oral TID WC  . ticagrelor  90 mg Oral BID  . vitamin B-12  1,500 mcg Oral Daily   . sodium chloride 10 mL/hr at 01-02-17 1235   sodium chloride, acetaminophen, ALPRAZolam, dextromethorphan-guaiFENesin, fentaNYL (SUBLIMAZE) injection, gi cocktail, lidocaine, nitroGLYCERIN, ondansetron (ZOFRAN) IV, senna-docusate, sodium chloride flush, traMADol  Exam: Gen: groggy, very sleepy, moderately obese, won't follow commands, wakes up minimally to voice Neck: cooperative, moderately obese, pale and RIJ cath Resp: diminished breath sounds bilaterally Cardio: S1, S2 normal and systolic murmur: systolic ejection 3/6, decrescendo at 2nd left intercostal space GI: obese, pos bs, mild distension Extremities: AVF LUA with Bruise, blisters on calf, gangrenous toes most of left foot   Dialysis: TTS [facility] 4h  67kg   2/2.25 bath  Hep 2000  LUA AVF   16ga needles (does not want 15 ga)  -  darbe 200/ wk last 2/8  - venofer 100 mg/ hd, one dose left  - last Hb 8, Ca 9, P 5.8, pth 251  - tums 1 ac, cozaar 50 hs       Assessment: 1. ESRD not tolerating HD well  2. Confusion/ delirium - multifact, dementia/ pain/ meds 3. PVD / dry gangrene left foot - sig pain, on prn po tramadol, po xanax and IV fentanyl 4. DM ok 5. Anemia esa, Fe ok 6. HPTH vit D 7. BioAVR/ CABG 2011 8. Hx CVA        9. Dispo - not candidate for OP HD in this condition  Plan - as above, have d/w pall care team, have called family and d/w Jill RuizJohn, one of the sons.    Jill Moselleob Jill Huss MD [facility] pager [phone]   01/01/2017, 12:44 PM    Recent Labs Lab 12/29/16 1013 12/30/16 0905 12/31/16 0352 12/31/16 0830 01/01/17 0416  NA 127* 129* 127*  --  126*  K 4.5 3.2* 3.3*  --  3.3*  CL 92* 92* 90*  --  92*  CO2 24 26 27   --  25  GLUCOSE 85 85 92  --  75  BUN 70* 30* 39*  --  32*  CREATININE 4.66* 2.59* 3.22*  --  2.76*  CALCIUM 8.0* 7.8* 7.9*  --  7.5*  PHOS 3.8  --   --  3.1  --     Recent Labs Lab 12/29/16 1013  AST 32  ALT 7*  ALKPHOS 102  BILITOT 1.1  PROT 4.4*  ALBUMIN 1.6*    Recent Labs Lab 12/30/16 0905 12/31/16 0352 01/01/17 0416  WBC 18.0* 17.0* 14.5*  HGB 7.1* 6.4* 7.8*  HCT 21.4* 19.3* 22.9*  MCV 100.0 101.0* 97.9  PLT 315 299 235   Iron/TIBC/Ferritin/ %Sat    Component Value Date/Time   IRON 75 12/29/2016 1013   TIBC 115 (L) 12/29/2016 1013   FERRITIN 387 (H) 10/20/2016 0929   IRONPCTSAT 65 (H) 12/29/2016 1013

## 2017-01-01 NOTE — Progress Notes (Signed)
Physical Therapy Treatment Patient Details Name: Jill Johnson MRN: [id number] DOB: 04-01-1941 Today's Date: 01/01/2017    History of Present Illness Patient is a 76 y/o female with hx of HTN, HLD, CKD, CAD s/p CABG, AVR, depression, severe PVD with critical limb ischemia of left foot with ulcerated wounds, ESRD on HD, hypothyroidiam admitted for outpatient angiogram. In short stay, pt with chest pain. Found to have NSTEMI s/p cardiac catheterization with SVG to PDA DES stent placement. 2/18 Lt cerebellar CVA without     PT Comments    Patient lethargic and eyes remained closed during session but pt oriented A&0x3. Pt continuing calling out for Tonya. Difficulty following simple 1 step commands consistently. Pt easily distracted. Requires assist of 2 for all mobility and not able to stand due to bil knees buckling and weakness throughout core. Pt with heavy left lean sitting EOB. Incontinent of urine and unaware of this. Will continue to follow.   Follow Up Recommendations  SNF;Supervision for mobility/OOB;Supervision/Assistance - 24 hour     Equipment Recommendations  None recommended by PT    Recommendations for Other Services       Precautions / Restrictions Precautions Precautions: Fall Restrictions Weight Bearing Restrictions: No    Mobility  Bed Mobility Overal bed mobility: Needs Assistance Bed Mobility: Supine to Sit;Sit to Supine     Supine to sit: Max assist;+2 for physical assistance Sit to supine: Total assist;+2 for physical assistance   General bed mobility comments: Cues throughout for sequencing and hand placement.   Transfers Overall transfer level: Needs assistance Equipment used: 2 person hand held assist Transfers: Sit to/from Stand Sit to Stand: Total assist;+2 physical assistance         General transfer comment: Total assist +2 to attempt sit to stand from EOB x1. Pt unable to come to full upright position; knees flexed throughout.    Ambulation/Gait                 Stairs            Wheelchair Mobility    Modified Rankin (Stroke Patients Only)       Balance Overall balance assessment: Needs assistance Sitting-balance support: Feet supported;Bilateral upper extremity supported Sitting balance-Leahy Scale: Poor Sitting balance - Comments: L lateral and posterior lean in sitting. Mod to max assist thorughout for sitting balance. Postural control: Left lateral lean;Posterior lean Standing balance support: During functional activity Standing balance-Leahy Scale: Zero                      Cognition Arousal/Alertness: Lethargic (eyes closed for most of session) Behavior During Therapy: WFL for tasks assessed/performed Overall Cognitive Status: No family/caregiver present to determine baseline cognitive functioning Area of Impairment: Problem solving;Attention;Following commands   Current Attention Level: Focused   Following Commands: Follows one step commands inconsistently;Follows one step commands with increased time       General Comments: oriented to person, place, and time.    Exercises      General Comments General comments (skin integrity, edema, etc.): HR in 50s-60s.      Pertinent Vitals/Pain Pain Assessment: Faces Faces Pain Scale: Hurts even more Pain Location: LLE Pain Descriptors / Indicators: Grimacing;Sore Pain Intervention(s): Limited activity within patient's tolerance;Monitored during session;Repositioned    Home Living Family/patient expects to be discharged to:: Skilled nursing facility                    Prior Function Level of Independence:  Needs assistance  Gait / Transfers Assistance Needed: Pt recently d/ced from SNF 1/15 and was ambulating with RW. once pt got home, she stopped moving per family. ADL's / Homemaking Assistance Needed: total A for ADls.     PT Goals (current goals can now be found in the care plan section) Acute Rehab PT  Goals Patient Stated Goal: none stated Progress towards PT goals: Not progressing toward goals - comment (secondary to lethargy, pain, confusion)    Frequency    Min 2X/week      PT Plan Current plan remains appropriate    Co-evaluation PT/OT/SLP Co-Evaluation/Treatment: Yes Reason for Co-Treatment: Complexity of the patient's impairments (multi-system involvement);Necessary to address cognition/behavior during functional activity;For patient/therapist safety PT goals addressed during session: Mobility/safety with mobility;Balance OT goals addressed during session: ADL's and self-care     End of Session Equipment Utilized During Treatment: Gait belt Activity Tolerance: Patient limited by lethargy Patient left: in bed;with call bell/phone within reach;with bed alarm set Nurse Communication: Mobility status;Need for lift equipment PT Visit Diagnosis: Muscle weakness (generalized) (M62.81);Pain Pain - Right/Left: Right Pain - part of body: Leg     Time: [id number] PT Time Calculation (min) (ACUTE ONLY): 28 min  Charges:  $Therapeutic Activity: 8-22 mins                    G Codes:       Jill Johnson 01/01/2017, 4:04 PM  Mylo RedShauna Decarlos Empey, PT, DPT [phone]

## 2017-01-01 NOTE — Progress Notes (Addendum)
PROGRESS NOTE  Jill Johnson YQM:[id number] DOB: 1941/09/27 DOA: 12/15/2016 PCP: Marjorie Smolder, MD   LOS: 14 days   Brief Narrative: 76 year old female with past medical history significant for end-stage renal disease on hemodialysis (TTS) started about 2 months ago, CAD status post CABG and AVR (AV replacement), hypothyroidism, hypertension, recent critical limb ischemia. Initially under cardiology primary care and [facility] consulted for evaluation of delirium. Please note patient under [place] primary care starting 12/22/2016. Patient has had left leg/foot pain for past month or so prior to this admission. She saw Dr. early in clinic and the plan was for outpatient angiography. In short stay, prior to the procedure, patient started to have chest pain and EKG showed new ST depressions. Patient was taken to cath lab on 12/10/2016. She is status post cardiac catheterization with SVG to PDA DES stent placement.  Pt also underwent aortogram with bilateral LE runoff, left popliteal stenting on 12/10/2016.   During this hospital stay pt had problems with infiltration. Per renal, optimal to rest the fistula- ideally should have TDC placement but likely not possible in the setting of DAPT requirement. PCT has met with pt who expressed desire to continue HD. RIJV HD catheter placed 2/16.  More left lower extremity weakness on 2/18 reported by family. MRI brain done and found to have acute infarct in left cerebellar hemisphere. Seen by stroke team. Stroke work up completed.  Barrier to discharge: not tolerating HD inpatient due to left limb pain so would not tolerate outpt HD and therefore she is not yet stable for discharge.  Goals of care discussions are underway  Assessment & Plan: Principal Problem:   Unstable angina (HCC) Active Problems:   S/P AVR (aortic valve replacement)   Anemia, chronic disease   ESRD on hemodialysis (HCC)   Critical lower limb ischemia   Coronary artery  disease involving native coronary artery of native heart with unstable angina pectoris ([place])   Other specified hypothyroidism   Goals of care, counseling/discussion   Advance care planning   Palliative care encounter   Palliative care by specialist   Cerebral thrombosis with cerebral infarction   Acute delirium   Hypokalemia   Goals of care -Patient with significant comorbidities, end-stage renal disease on hemodialysis, not able to tolerate hemodialysis, acute stroke, recent NSTEMI, potential GI bleed,  peripheral vascular disease with critical limb ischemia, acute encephalopathy/delirium in the setting of underlying dementia.  Discussed extensively with patient's son over the phone, less likely that she will recover from this hospitalization to achieve a meaningful life.  He understands, will discuss with the rest of the family  and is considering comfort based approach.  Discussed over the phone with palliative care team [facility], as well as with Dr. Jonnie Finner from nephrology.  She is a poor long-term hemodialysis candidate  End-stage renal disease with hemodialysis / Hypokalemia -  Per renal, need to rest the fistula- ideally should have TDC placement but likely not possible in the setting of DAPT requirement. PCT has met with pt who expressed desire to continue HD - RIJV HD cath placed 2/16 per IR - HD per renal schedule - Correct potassium with HD  - Her left upper arm AV fistula is patent per vascular surgery and she is using her temporary catheter in the right internal jugular vein to allow the hematoma in her fistula to resolve. - Appreciate palliative care team following   Acute ischemic infarct left cerebellar hemisphere -Stroke work up initiated -Continue daily  aspirin  -MRI brain - Punctate 5 mm acute ischemic infarct within the peripheral aspect of the inferior left cerebellar hemisphere. No associated hemorrhage or mass effect. -2D ECHO - EF 60-65%, grade 2  DD -HgbA1c - 4.4 -Lipid panel - 27. LDL goal < 100. Patient on Lipitor 80 mg at bedtime  -Diet: renal diet  -Therapy: PT/OT - SNF recommended on discharge  -Appreciate neurology following - per stroke team no further aggressive stroke work up necessary   Non-ST elevation myocardial infarction - SVG to PDA stent placement, DES. - Dual antiplatelet therapy for at least 6 months per cardiology  - Continue aggressive secondary prevention with asa and brilinta as well as atorvastatin, carvedilol  Peripheral vascular disease with critical limb ischemia / Leukocytosis  - Per Dr. Donnetta Hutching pt is very poor surgical candidate - S/P Aortogram with bilateral LE runoff, left popliteal stenting 12/25/2016 by Dr. Sherren Mocha Early  - No symptoms in left leg at rest  Hyperlipidemia - Continue Lipitor 80 mg at bedtime   Bioprosthetic aortic valve replacement 2011 - Recent echocardiogram in December 2017 showed normal function  Coronary artery disease status post CABG 2011 - SVG to PDA stenting  Anemia of chronic kidney disease - Hgb 6.4 this am - Transfuse 1 U PRBC this am - Continue Aranesp with HD   Hyponatremia - Likely in the setting of ESRD  - Renal following  Delirium - very confused this morning   Hypothyroidism  - Continue synthroid 112 mcg daily   Bilateral LEs and left foot toes pressure injury  - Left medial LE: 3.5 x 2 with depth obscured by the presence of eschar, no exudate - Left lateral LE: 3cm x 1cm x 0.1cm partial thickness with pink, moist base and scant serous exudate - Right LE (anterior): 2.5cm x 1.5cm x 0.2cm full thickness wound with light yellow, moist wound bed and small amount of serous exudate - Dressing procedure/placement/frequency: Conservative wound care using betadine swab sticks to the left toes with eschar in an effort to keep them dry and infection free. Prevalon Boots are provided bilaterally to protect from injury. The LE wounds care twice  daily with saline moistened gauze and topped with protective silicone foam.    DVT prophylaxis: heparin Code Status: DNR/DNI Family Communication: no family at bedside. Disposition Plan: TBD  Consultants:   Palliative  Cardiology  Nephrology  Vascular surgery  Procedures:   ESDR on HD TTS  Aortogram with bilateral LE runoff, left popliteal stenting - 12/19/2016  RIJV HF catheter placed 12/25/16  1 U PRBC transfusion 12/31/16  Antimicrobials:  None    Subjective: -She is very confused this morning, can answer simple questions with yes and no however does not follow commands consistently  Objective: Vitals:   12/31/16 2034 01/01/17 0425 01/01/17 0500 01/01/17 0806  BP:  (!) 126/53  (!) 192/81  Pulse:   69   Resp:  12  16  Temp: 98 F (36.7 C)     TempSrc: Oral     SpO2:   99%   Weight:  72.1 kg (158 lb 14.4 oz)    Height:        Intake/Output Summary (Last 24 hours) at 01/01/17 0927 Last data filed at 12/31/16 1025  Gross per 24 hour  Intake              318 ml  Output              364 ml  Net              -  46 ml   Filed Weights   12/31/16 0500 12/31/16 0810 01/01/17 0425  Weight: 69.7 kg (153 lb 10.6 oz) 70.5 kg (155 lb 6.8 oz) 72.1 kg (158 lb 14.4 oz)    Examination: Constitutional: NAD Vitals:   12/31/16 2034 01/01/17 0425 01/01/17 0500 01/01/17 0806  BP:  (!) 126/53  (!) 192/81  Pulse:   69   Resp:  12  16  Temp: 98 F (36.7 C)     TempSrc: Oral     SpO2:   99%   Weight:  72.1 kg (158 lb 14.4 oz)    Height:       Eyes: PERRL ENMT: Mucous membranes are moist. Respiratory: clear to auscultation bilaterally, no wheezing, no crackles.  Cardiovascular: Regular rate and rhythm, no murmurs / rubs / gallops. No LE edema.  Abdomen: no tenderness. Bowel sounds positive.  Musculoskeletal: Necrotic toes Neurologic: Moves all 4, no focal deficits, disoriented Psychiatric:  Alert and oriented to person only.    Data Reviewed: I have personally  reviewed following labs and imaging studies  CBC:  Recent Labs Lab 12/28/16 0431 12/29/16 0658 12/30/16 0905 12/31/16 0352 01/01/17 0416  WBC 11.8* 12.3* 18.0* 17.0* 14.5*  HGB 7.5* 7.2* 7.1* 6.4* 7.8*  HCT 22.8* 21.9* 21.4* 19.3* 22.9*  MCV 98.7 100.0 100.0 101.0* 97.9  PLT 326 337 315 299 979   Basic Metabolic Panel:  Recent Labs Lab 12/28/16 0431 12/29/16 1013 12/30/16 0905 12/31/16 0352 12/31/16 0830 01/01/17 0416  NA 127* 127* 129* 127*  --  126*  K 4.2 4.5 3.2* 3.3*  --  3.3*  CL 93* 92* 92* 90*  --  92*  CO2 '25 24 26 27  '$ --  25  GLUCOSE 87 85 85 92  --  75  BUN 40* 70* 30* 39*  --  32*  CREATININE 3.63* 4.66* 2.59* 3.22*  --  2.76*  CALCIUM 7.8* 8.0* 7.8* 7.9*  --  7.5*  PHOS  --  3.8  --   --  3.1  --    GFR: Estimated Creatinine Clearance: 15.1 mL/min (by C-G formula based on SCr of 2.76 mg/dL (H)). Liver Function Tests:  Recent Labs Lab 12/29/16 1013  AST 32  ALT 7*  ALKPHOS 102  BILITOT 1.1  PROT 4.4*  ALBUMIN 1.6*   No results for input(s): LIPASE, AMYLASE in the last 168 hours. No results for input(s): AMMONIA in the last 168 hours. Coagulation Profile: No results for input(s): INR, PROTIME in the last 168 hours. Cardiac Enzymes: No results for input(s): CKTOTAL, CKMB, CKMBINDEX, TROPONINI in the last 168 hours. BNP (last 3 results) No results for input(s): PROBNP in the last 8760 hours. HbA1C: No results for input(s): HGBA1C in the last 72 hours. CBG: No results for input(s): GLUCAP in the last 168 hours. Lipid Profile: No results for input(s): CHOL, HDL, LDLCALC, TRIG, CHOLHDL, LDLDIRECT in the last 72 hours. Thyroid Function Tests: No results for input(s): TSH, T4TOTAL, FREET4, T3FREE, THYROIDAB in the last 72 hours. Anemia Panel:  Recent Labs  12/29/16 1013  TIBC 115*  IRON 75   Urine analysis:    Component Value Date/Time   COLORURINE YELLOW 10/28/2016 1600   APPEARANCEUR CLEAR 10/28/2016 1600   LABSPEC 1.011 10/28/2016  1600   PHURINE 6.5 10/28/2016 1600   GLUCOSEU NEGATIVE 10/28/2016 1600   HGBUR NEGATIVE 10/28/2016 1600   BILIRUBINUR NEGATIVE 10/28/2016 1600   KETONESUR NEGATIVE 10/28/2016 1600   PROTEINUR 30 (A) 10/28/2016 1600  UROBILINOGEN 1.0 01/30/2010 1104   NITRITE NEGATIVE 10/28/2016 1600   LEUKOCYTESUR NEGATIVE 10/28/2016 1600   Sepsis Labs: Invalid input(s): PROCALCITONIN, LACTICIDVEN  Recent Results (from the past 240 hour(s))  Surgical PCR screen     Status: None   Collection Time: 12/12/2016  4:31 AM  Result Value Ref Range Status   MRSA, PCR NEGATIVE NEGATIVE Final   Staphylococcus aureus NEGATIVE NEGATIVE Final    Comment:        The Xpert SA Assay (FDA approved for NASAL specimens in patients over 4 years of age), is one component of a comprehensive surveillance program.  Test performance has been validated by [facility] for patients greater than or equal to 71 year old. It is not intended to diagnose infection nor to guide or monitor treatment.       Radiology Studies: No results found.   Scheduled Meds: . sodium chloride   Intravenous Once  . acetaminophen  1,000 mg Oral TID  . aspirin  81 mg Oral Daily  . atorvastatin  80 mg Oral q1800  . carvedilol  3.125 mg Oral BID WC  . darbepoetin (ARANESP) injection - DIALYSIS  200 mcg Intravenous Q Thu-HD  . diclofenac sodium  2 g Topical TID  . docusate sodium  100 mg Oral BID  . feeding supplement (PRO-STAT SUGAR FREE 64)  30 mL Oral BID  . heparin  5,000 Units Subcutaneous Q8H  . levothyroxine  112 mcg Oral QAC breakfast  . lidocaine  1 application Topical TID  . multivitamin  1 tablet Oral QHS  . OLANZapine zydis  5 mg Oral QHS  . polyethylene glycol  17 g Oral BID  . senna  1 tablet Oral BID  . sodium chloride flush  3 mL Intravenous Q12H  . sucroferric oxyhydroxide  1,000 mg Oral TID WC  . ticagrelor  90 mg Oral BID  . vitamin B-12  1,500 mcg Oral Daily   Continuous Infusions: . sodium chloride 10  mL/hr at 12/11/2016 1235    Time spent: 35 minutes, GOC discussions and discussions with consultants as outlined above  Marzetta Board, MD, PhD [facility] Pager [phone]  If 7PM-7AM, please contact night-coverage [email or web address] Password [facility] 01/01/2017, 9:27 AM

## 2017-01-02 DIAGNOSIS — R451 Restlessness and agitation: Secondary | ICD-10-CM

## 2017-01-02 DIAGNOSIS — Z515 Encounter for palliative care: Secondary | ICD-10-CM

## 2017-01-02 MED ORDER — ALPRAZOLAM 0.25 MG PO TABS
0.2500 mg | ORAL_TABLET | Freq: Three times a day (TID) | ORAL | Status: DC
  Administered 2017-01-02: 0.25 mg via ORAL
  Filled 2017-01-02: qty 1

## 2017-01-02 NOTE — Progress Notes (Signed)
Daily Progress Note   Patient Name: Jill Johnson       Date: 01/02/2017 DOB: 1941/03/18  Age: 76 y.o. MRN#: [id number] Attending Physician: Jill Gilding, MD Primary Care Physician: Jill Espy, MD Admit Date: 12/13/2016  Reason for Consultation/Follow-up: Establishing goals of care and Terminal Care  Subjective:  Patient awake in bed. Her brother is at bedside. She is angry and calling out in pain. She tells me she doesn't like her room and wants to go to another room. Having pain in her left foot and "all over".  Son, Jill Johnson, arrived a few minutes later. He is very concerned about his mother being in pain and anxious. He would rather that she was sedated than be in pain, although he tells me she had some good awake moments last night and visited with friends. Discussed possible continuous infusion of opioid. Jill Johnson would like to wait to start this and he hopes for his brother, Jill Johnson, to get to experience some of the awake "good" moments that he had with Mom last night.   Patient Profile/HPI:  76 y.o. female  with past medical history of ESRD (on HD), HLD, HTN, CAD s/p CABG (2011), TIA, CVA, critical limb ischemia admitted on 12/27/2016 with chest pain. She had presented for scheduled lower limb angiography, however, before the procedure she was found to have chest pain and EKG revealed ST depression. She is now s/p cardiac cath with stent placement. She has continued to have altered mental status and a small stroke was found on MRI.  She is tolerating HD, but is bradycardic and lethargic. Overnight 2/22 the patient's hgb dropped to 6.4 and a blood transfusion was ordered. GOC are now full comfort care.  Length of Stay: 15  Current Medications: Scheduled Meds:  . ALPRAZolam  0.25 mg Oral TID    . lidocaine  1 application Topical TID  . OLANZapine zydis  5 mg Oral QHS  . senna  1 tablet Oral BID  . sodium chloride flush  3 mL Intravenous Q12H  . sodium chloride flush  3 mL Intravenous Q12H    Continuous Infusions:   PRN Meds: sodium chloride, sodium chloride, acetaminophen, antiseptic oral rinse, fentaNYL (SUBLIMAZE) injection, glycopyrrolate **OR** glycopyrrolate **OR** glycopyrrolate, haloperidol **OR** haloperidol lactate, lidocaine, nitroGLYCERIN, ondansetron (ZOFRAN) IV, polyvinyl alcohol, sodium chloride flush, sodium  chloride flush, traMADol  Physical Exam  Constitutional: She appears well-developed and well-nourished. She appears distressed.  Cardiovascular: Normal rate and regular rhythm.   Murmur heard. Pulmonary/Chest: Effort normal and breath sounds normal.  Abdominal: Soft.  Neurological: She is alert.  Not oriented to place or time  Skin: Skin is warm and dry. There is pallor.  Gangrenous left toes  Psychiatric: Her mood appears anxious. Her affect is angry. She is agitated. Cognition and memory are impaired. She expresses impulsivity.  Nursing note and vitals reviewed.       Vital Signs: BP (!) 156/34 (BP Location: Right Arm)   Pulse (!) 50   Temp 98.1 F (36.7 C) (Oral)   Resp 14   Ht 5' (1.524 m)   Wt 71.8 kg (158 lb 6.4 oz)   SpO2 97%   BMI 30.94 kg/m  SpO2: SpO2: 97 % O2 Device: O2 Device: Not Delivered O2 Flow Rate: O2 Flow Rate (L/min): 2 L/min  Intake/output summary:   Intake/Output Summary (Last 24 hours) at 01/02/17 1051 Last data filed at 01/01/17 1300  Gross per 24 hour  Intake              120 ml  Output                0 ml  Net              120 ml   LBM: Last BM Date: 12/30/16 Baseline Weight: Weight: 71.7 kg (158 lb) Most recent weight: Weight: 71.8 kg (158 lb 6.4 oz)       Palliative Assessment/Data: PPS: 10%    Flowsheet Rows   Flowsheet Row Most Recent Value  Intake Tab  Referral Department  Hospitalist  Unit at  Time of Referral  Cardiac/Telemetry Unit  Palliative Care Primary Diagnosis  Other (Comment)  Date Notified  12/22/16  Palliative Care Type  New Palliative care  Reason for referral  Clarify Goals of Care  Date of Admission  12/25/2016  # of days IP prior to Palliative referral  4  Clinical Assessment  Palliative Performance Scale Score  20%  Psychosocial & Spiritual Assessment  Palliative Care Outcomes  Patient/Family meeting held?  Yes  Who was at the meeting?  2 sons, sister, dtr in law  Palliative Care Outcomes  Clarified goals of care, Counseled regarding hospice      Patient Active Problem List   Diagnosis Date Noted  . Comfort measures only status   . Encounter for hospice care discussion   . Acute delirium   . Hypokalemia   . Cerebral thrombosis with cerebral infarction 12/29/2016  . Palliative care by specialist   . Palliative care encounter   . Other specified hypothyroidism   . Goals of care, counseling/discussion   . Advance care planning   . Unstable angina (HCC) 01/04/2017  . Anemia, chronic disease 12/31/2016  . ESRD on hemodialysis (HCC) 12/21/2016  . PAD (peripheral artery disease) (HCC) 01/04/2017  . Critical lower limb ischemia 12/20/2016  . Coronary artery disease involving native coronary artery of native heart with unstable angina pectoris (HCC) 12/17/2016  . Chest pain 10/28/2016  . Fluid overload 10/28/2016  . Elevated troponin 10/28/2016  . Symptomatic anemia 10/28/2016  . ESRD (end stage renal disease) (HCC) 10/28/2016  . Hypothyroidism 10/28/2016  . Respiratory distress 10/28/2016  . Acute diastolic heart failure (HCC) 12/20/2015  . Obesity 12/20/2015  . Coronary artery disease due to lipid rich plaque 12/20/2015  . S/P AVR (aortic  valve replacement) 12/20/2015  . Chronic kidney disease, stage 3 12/20/2015    Palliative Care Plan    Recommendations/Plan: Pain- continue fentanyl q 15prn- discussed need for aggressive mgmt with RN- Jill Johnson.  Will consider transition to continuous infusion once dosing requirements are determined and sons agree. Please round frequently and document response to PRN pain medication. Anxiety- changed xanax to scheduled. Pt will need IV haldol if she cannot swallow. Continue other comfort meds as ordered. D/C non comfort medications and interventions. Delirium precautions  Goals of Care and Additional Recommendations:  Limitations on Scope of Treatment: Full Comfort Care  Code Status:  DNR  Prognosis:   Hours - Days given renal failure d/c'ing HD, prolonged delirium, CVA, CAD   Discharge Planning:  Anticipated Hospital Death vs possible transfer to hospice house if stable and comfortable on Monday.    Care plan was discussed with family and RN- Ashok CordiaMarissa.  Thank you for allowing the Palliative Medicine Team to assist in the care of this patient.  Total time spent:  65 minutes 0950-1055 Prolong services charged: Yes    Greater than 50%  of this time was spent counseling and coordinating care related to the above assessment and plan.  Ocie BobKasie Elizabth Palka, AGNP-C Palliative Medicine    Please contact Palliative MedicineTeam phone at [phone] for questions and concerns between 7 am - 7 pm.   Please see AMION for individual provider pager numbers.

## 2017-01-02 NOTE — Progress Notes (Addendum)
0930 Received pt from [facility] via bed, A&O x1. Grand daughter at the bedside. Reoriented to room set up. LLE and left foot pain noted, medicated as needed.

## 2017-01-02 NOTE — Progress Notes (Signed)
Plan now is for full comfort care, no further medical interventions, including dialysis.  Will sign off. Please call as needed.    Vinson Moselleob Laysha Childers MD [facility] pgr [phone]   01/02/2017, 12:21 PM

## 2017-01-02 NOTE — Progress Notes (Signed)
No charge note:  Patient resting comfortably. RN Ashok CordiaMarissa reports patient comfortable after one dose IV fentanyl so will hold off on continuous infusion. Continue scheduled xanax, if patient unable to swallow, please administer haldol IV. No family at bedside.   Ocie BobKasie Mahan, AGNP-C Palliative Medicine  Please call Palliative Medicine team phone with any questions [phone]. For individual providers please see AMION.

## 2017-01-02 NOTE — Progress Notes (Signed)
Pt transferred to [id number]. Marissa, RN  at the bedside report given to receiving RN. Questions answered. Erin grand daughter at the bedside. Bethann BerkshireJohnny (son)  notified of patient transfer to [phone]. Belongings packed and sent with patient.

## 2017-01-02 NOTE — Progress Notes (Signed)
PROGRESS NOTE  Jill Johnson:[id number] DOB: 1941/02/27 DOA: 12/26/2016 PCP: Marjorie Smolder, MD   LOS: 15 days   Brief Narrative: 76 year old female with past medical history significant for end-stage renal disease on hemodialysis (TTS) started about 2 months ago, CAD status post CABG and AVR (AV replacement), hypothyroidism, hypertension, recent critical limb ischemia. Initially under cardiology primary care and [facility] consulted for evaluation of delirium. Please note patient under [facility] primary care starting 12/22/2016. Patient has had left leg/foot pain for past month or so prior to this admission. She saw Dr. early in clinic and the plan was for outpatient angiography. In short stay, prior to the procedure, patient started to have chest pain and EKG showed new ST depressions. Patient was taken to cath lab on 12/14/2016. She is status post cardiac catheterization with SVG to PDA DES stent placement.  Pt also underwent aortogram with bilateral LE runoff, left popliteal stenting on 12/12/2016.   During this hospital stay pt had problems with infiltration. Per renal, optimal to rest the fistula- ideally should have TDC placement but likely not possible in the setting of DAPT requirement. PCT has met with pt who expressed desire to continue HD. RIJV HD catheter placed 2/16.  More left lower extremity weakness on 2/18 reported by family. MRI brain done and found to have acute infarct in left cerebellar hemisphere. Seen by stroke team. Stroke work up completed.   Assessment & Plan: Principal Problem:   Unstable angina (HCC) Active Problems:   S/P AVR (aortic valve replacement)   Anemia, chronic disease   ESRD on hemodialysis (HCC)   Critical lower limb ischemia   Coronary artery disease involving native coronary artery of native heart with unstable angina pectoris ([place])   Other specified hypothyroidism   Goals of care, counseling/discussion   Advance care planning  Palliative care encounter   Palliative care by specialist   Cerebral thrombosis with cerebral infarction   Acute delirium   Hypokalemia   Comfort measures only status   Encounter for hospice care discussion   Goals of care -Patient with significant comorbidities, end-stage renal disease on hemodialysis, not able to tolerate hemodialysis, acute stroke, recent NSTEMI, potential GI bleed,  peripheral vascular disease with critical limb ischemia, acute encephalopathy/delirium in the setting of underlying dementia.  Discussed extensively with patient's son over the phone 2/23, less likely that she will recover from this hospitalization to achieve a meaningful life.  Discussed over the phone with palliative care team [facility], as well as with Dr. Jonnie Finner from nephrology.  She is a poor long-term hemodialysis candidate.  After family meeting with palliative care on 2/23 evening, patient's care was transitioned towards comfort.  End-stage renal disease with hemodialysis / Hypokalemia -  Per renal, need to rest the fistula- ideally should have TDC placement but likely not possible in the setting of DAPT requirement. PCT has met with pt who expressed desire to continue HD - RIJV HD cath placed 2/16 per IR - HD per renal schedule - Correct potassium with HD  - Her left upper arm AV fistula is patent per vascular surgery and she is using her temporary catheter in the right internal jugular vein to allow the hematoma in her fistula to resolve. - Appreciate palliative care team following, now off dialysis given comfort approach  Acute ischemic infarct left cerebellar hemisphere -MRI brain - Punctate 5 mm acute ischemic infarct within the peripheral aspect of the inferior left cerebellar hemisphere. No associated hemorrhage or mass effect. -  2D ECHO - EF 60-65%, grade 2 DD -HgbA1c - 4.4 -Lipid panel - 27. LDL goal < 100. Patient on Lipitor 80 mg at bedtime  -Diet: renal diet  -Appreciate neurology  following - per stroke team no further aggressive stroke work up necessary  -Now comfort approach  Non-ST elevation myocardial infarction -SVG to PDA stent placement, DES.  Peripheral vascular disease with critical limb ischemia / Leukocytosis  -Per Dr. Donnetta Hutching pt is very poor surgical candidate -S/P Aortogram with bilateral LE runoff, left popliteal stenting 12/24/2016 by Dr. Sherren Mocha Early  -No symptoms in left leg at rest  Hyperlipidemia  Bioprosthetic aortic valve replacement 2011 -Recent echocardiogram in December 2017 showed normal function  Coronary artery disease status post CABG 2011 -SVG to PDA stenting  Anemia of chronic kidney disease as well as probable GI bleed in the setting of the DAPT  Hyponatremia -Likely in the setting of ESRD   Delirium -Intermittent, expect to get worse without dialysis as we are gearing towards for comfort  Hypothyroidism   Bilateral LEs and left foot toes pressure injury  - Left medial LE: 3.5 x 2 with depth obscured by the presence of eschar, no exudate - Left lateral LE: 3cm x 1cm x 0.1cm partial thickness with pink, moist base and scant serous exudate - Right LE (anterior): 2.5cm x 1.5cm x 0.2cm full thickness wound with light yellow, moist wound bed and small amount of serous exudate - Dressing procedure/placement/frequency: Comfort approach   DVT prophylaxis: heparin Code Status: DNR/DNI Family Communication: no family at bedside, discussed with son [name] over the phone 2/23 Disposition Plan: TBD  Consultants:   Palliative  Cardiology  Nephrology  Vascular surgery  Procedures:   ESDR on HD TTS  Aortogram with bilateral LE runoff, left popliteal stenting - 12/16/2016  RIJV HF catheter placed 12/25/16  1 U PRBC transfusion 12/31/16  Antimicrobials:  None    Subjective: -Somewhat confused this morning, able to carry a conversation  Objective: Vitals:   01/01/17 1809 01/01/17 1812 01/02/17 0500 01/02/17  0934  BP: (!) 171/45 (!) 168/36  (!) 156/34  Pulse: (!) 55   (!) 50  Resp:      Temp:    98.1 F (36.7 C)  TempSrc:    Oral  SpO2:    97%  Weight:   71.8 kg (158 lb 6.4 oz)   Height:        Intake/Output Summary (Last 24 hours) at 01/02/17 1000 Last data filed at 01/01/17 1300  Gross per 24 hour  Intake              120 ml  Output                0 ml  Net              120 ml   Filed Weights   12/31/16 0810 01/01/17 0425 01/02/17 0500  Weight: 70.5 kg (155 lb 6.8 oz) 72.1 kg (158 lb 14.4 oz) 71.8 kg (158 lb 6.4 oz)    Examination: Constitutional: NAD Vitals:   01/01/17 1809 01/01/17 1812 01/02/17 0500 01/02/17 0934  BP: (!) 171/45 (!) 168/36  (!) 156/34  Pulse: (!) 55   (!) 50  Resp:      Temp:    98.1 F (36.7 C)  TempSrc:    Oral  SpO2:    97%  Weight:   71.8 kg (158 lb 6.4 oz)   Height:       Eyes:  PERRL ENMT: Mucous membranes are moist. Respiratory: clear to auscultation bilaterally, no wheezing, no crackles.  Cardiovascular: Regular rate and rhythm, no murmurs / rubs / gallops. No LE edema.  Abdomen: no tenderness. Bowel sounds positive.  Musculoskeletal: Necrotic toes Neurologic: Moves all 4, no focal deficits, disoriented Psychiatric:  Alert and oriented to person only.    Data Reviewed: I have personally reviewed following labs and imaging studies  CBC:  Recent Labs Lab 12/28/16 0431 12/29/16 0658 12/30/16 0905 12/31/16 0352 01/01/17 0416  WBC 11.8* 12.3* 18.0* 17.0* 14.5*  HGB 7.5* 7.2* 7.1* 6.4* 7.8*  HCT 22.8* 21.9* 21.4* 19.3* 22.9*  MCV 98.7 100.0 100.0 101.0* 97.9  PLT 326 337 315 299 774   Basic Metabolic Panel:  Recent Labs Lab 12/28/16 0431 12/29/16 1013 12/30/16 0905 12/31/16 0352 12/31/16 0830 01/01/17 0416  NA 127* 127* 129* 127*  --  126*  K 4.2 4.5 3.2* 3.3*  --  3.3*  CL 93* 92* 92* 90*  --  92*  CO2 '25 24 26 27  '$ --  25  GLUCOSE 87 85 85 92  --  75  BUN 40* 70* 30* 39*  --  32*  CREATININE 3.63* 4.66* 2.59* 3.22*   --  2.76*  CALCIUM 7.8* 8.0* 7.8* 7.9*  --  7.5*  PHOS  --  3.8  --   --  3.1  --    GFR: Estimated Creatinine Clearance: 15.1 mL/min (by C-G formula based on SCr of 2.76 mg/dL (H)). Liver Function Tests:  Recent Labs Lab 12/29/16 1013  AST 32  ALT 7*  ALKPHOS 102  BILITOT 1.1  PROT 4.4*  ALBUMIN 1.6*   No results for input(s): LIPASE, AMYLASE in the last 168 hours. No results for input(s): AMMONIA in the last 168 hours. Coagulation Profile: No results for input(s): INR, PROTIME in the last 168 hours. Cardiac Enzymes: No results for input(s): CKTOTAL, CKMB, CKMBINDEX, TROPONINI in the last 168 hours. BNP (last 3 results) No results for input(s): PROBNP in the last 8760 hours. HbA1C: No results for input(s): HGBA1C in the last 72 hours. CBG: No results for input(s): GLUCAP in the last 168 hours. Lipid Profile: No results for input(s): CHOL, HDL, LDLCALC, TRIG, CHOLHDL, LDLDIRECT in the last 72 hours. Thyroid Function Tests: No results for input(s): TSH, T4TOTAL, FREET4, T3FREE, THYROIDAB in the last 72 hours. Anemia Panel: No results for input(s): VITAMINB12, FOLATE, FERRITIN, TIBC, IRON, RETICCTPCT in the last 72 hours. Urine analysis:    Component Value Date/Time   COLORURINE YELLOW 10/28/2016 1600   APPEARANCEUR CLEAR 10/28/2016 1600   LABSPEC 1.011 10/28/2016 1600   PHURINE 6.5 10/28/2016 1600   GLUCOSEU NEGATIVE 10/28/2016 1600   HGBUR NEGATIVE 10/28/2016 1600   BILIRUBINUR NEGATIVE 10/28/2016 1600   KETONESUR NEGATIVE 10/28/2016 1600   PROTEINUR 30 (A) 10/28/2016 1600   UROBILINOGEN 1.0 01/30/2010 1104   NITRITE NEGATIVE 10/28/2016 1600   LEUKOCYTESUR NEGATIVE 10/28/2016 1600   Sepsis Labs: Invalid input(s): PROCALCITONIN, LACTICIDVEN  No results found for this or any previous visit (from the past 240 hour(s)).    Radiology Studies: No results found.   Scheduled Meds: . sodium chloride   Intravenous Once  . carvedilol  3.125 mg Oral BID WC  .  lidocaine  1 application Topical TID  . OLANZapine zydis  5 mg Oral QHS  . senna  1 tablet Oral BID  . sodium chloride flush  3 mL Intravenous Q12H  . sodium chloride flush  3 mL Intravenous  Q12H  . ticagrelor  90 mg Oral BID   Continuous Infusions:  Marzetta Board, MD, PhD [facility] Pager [phone]  If 7PM-7AM, please contact night-coverage [email or web address] Password [email or web address] 01/02/2017, 10:00 AM

## 2017-01-03 MED ORDER — BISACODYL 10 MG RE SUPP: 10.0000 mg | Freq: Every day | RECTAL | Status: DC | PRN

## 2017-01-03 MED ORDER — HALOPERIDOL LACTATE 5 MG/ML IJ SOLN
1.0000 mg | Freq: Three times a day (TID) | INTRAMUSCULAR | Status: DC
  Administered 2017-01-03 – 2017-01-05 (×5): 1 mg via INTRAVENOUS
  Filled 2017-01-03 (×5): qty 1

## 2017-01-03 NOTE — Progress Notes (Addendum)
Daily Progress Note   Patient Name: Jill Johnson       Date: 01/03/2017 DOB: [birth year]  Age: 76 y.o. MRN#: [id number] Attending Physician: Leatha Gildingostin M Gherghe, MD Primary Care Physician: Hollice EspyGATES,DONNA RUTH, MD Admit Date: 12/20/2016  Reason for Consultation/Follow-up: Psychosocial/spiritual support and Terminal Care  Subjective:  Jill Johnson was resting comfortably in bed on my arrival to the room. Family at the bedside relate that she did well overnight, and has not required any additional pain or anxiety medication since yesterday. Some concern that she will wake in pain given the lack of medication since yesterday. While I was in the room she did stir a bit to voice, but was quick to fall back asleep. When briefly awake she did have a furrowed brow, speak a few pressured words, and appeared restless.  Patient Profile/HPI: 76 y.o. female  with past medical history of ESRD (on HD), HLD, HTN, CAD s/p CABG (2011), TIA, CVA, critical limb ischemia admitted on 01/04/2017 with chest pain. She had presented for scheduled lower limb angiography, however, before the procedure she was found to have chest pain and EKG revealed ST depression. She is now s/p cardiac cath with stent placement. She has continued to have altered mental status and a small stroke was found on MRI.  She was tolerating HD, but is bradycardic and lethargic. Overnight 2/22 the patient's hgb dropped to 6.4 and a blood transfusion was ordered. GOC are now full comfort care.  Length of Stay: 16  Current Medications: Scheduled Meds:  . ALPRAZolam  0.25 mg Oral TID  . OLANZapine zydis  5 mg Oral QHS  . senna  1 tablet Oral BID  . sodium chloride flush  3 mL Intravenous Q12H    Continuous Infusions:   PRN Meds: sodium chloride,  acetaminophen, antiseptic oral rinse, fentaNYL (SUBLIMAZE) injection, glycopyrrolate **OR** glycopyrrolate **OR** glycopyrrolate, haloperidol **OR** haloperidol lactate, nitroGLYCERIN, ondansetron (ZOFRAN) IV, polyvinyl alcohol, sodium chloride flush, traMADol  Physical Exam  Constitutional: She appears well-developed and well-nourished. She is sleeping. No distress.  Resting in bed comfortably. Brief periods of restlessness, but quick to fall back asleep.  Pulmonary/Chest: Effort normal. No respiratory distress.  Slight gurgling of secretions audible.  Neurological:  Not oriented to place or time  Skin: There is pallor.  Gangrenous left toes  Nursing note and  vitals reviewed.  **Physical exam limited. Family did not want pt to be woken and asked for limited physical contact as it agitates her.  Vital Signs: BP (!) 156/34 (BP Location: Right Arm)   Pulse (!) 50   Temp 98.1 F (36.7 C) (Oral)   Resp 14   Ht 5' (1.524 m)   Wt 71.8 kg (158 lb 6.4 oz)   SpO2 97%   BMI 30.94 kg/m  SpO2: SpO2: 97 % O2 Device: O2 Device: Not Delivered O2 Flow Rate: O2 Flow Rate (L/min): 2 L/min  Intake/output summary:   Intake/Output Summary (Last 24 hours) at 01/03/17 0981 Last data filed at 01/03/17 1914  Gross per 24 hour  Intake                3 ml  Output                0 ml  Net                3 ml   LBM: Last BM Date: 12/30/16 Baseline Weight: Weight: 71.7 kg (158 lb) Most recent weight: Weight: 71.8 kg (158 lb 6.4 oz)       Palliative Assessment/Data:  PPS: 10%   Flowsheet Rows   Flowsheet Row Most Recent Value  Intake Tab  Referral Department  Hospitalist  Unit at Time of Referral  Cardiac/Telemetry Unit  Palliative Care Primary Diagnosis  Other (Comment)  Date Notified  12/22/16  Palliative Care Type  New Palliative care  Reason for referral  Clarify Goals of Care  Date of Admission  12/22/2016  # of days IP prior to Palliative referral  4  Clinical Assessment  Palliative  Performance Scale Score  20%  Psychosocial & Spiritual Assessment  Palliative Care Outcomes  Patient/Family meeting held?  Yes  Who was at the meeting?  2 sons, sister, dtr in law  Palliative Care Outcomes  Clarified goals of care, Counseled regarding hospice      Patient Active Problem List   Diagnosis Date Noted  . Terminal care   . Agitated   . Comfort measures only status   . Encounter for hospice care discussion   . Acute delirium   . Hypokalemia   . Cerebral thrombosis with cerebral infarction 12/29/2016  . Palliative care by specialist   . Palliative care encounter   . Other specified hypothyroidism   . Goals of care, counseling/discussion   . Advance care planning   . Unstable angina (HCC) 12/21/2016  . Anemia, chronic disease 12/27/2016  . ESRD on hemodialysis (HCC) 12/14/2016  . PAD (peripheral artery disease) (HCC) 01/04/2017  . Critical lower limb ischemia 12/13/2016  . Coronary artery disease involving native coronary artery of native heart with unstable angina pectoris (HCC) 12/25/2016  . Chest pain 10/28/2016  . Fluid overload 10/28/2016  . Elevated troponin 10/28/2016  . Symptomatic anemia 10/28/2016  . ESRD (end stage renal disease) (HCC) 10/28/2016  . Hypothyroidism 10/28/2016  . Respiratory distress 10/28/2016  . Acute diastolic heart failure (HCC) 12/20/2015  . Obesity 12/20/2015  . Coronary artery disease due to lipid rich plaque 12/20/2015  . S/P AVR (aortic valve replacement) 12/20/2015  . Chronic kidney disease, stage 3 12/20/2015    Palliative Care Plan    Recommendations/Plan:  Continue comfort measures, with a focus on eliminating pain and anxiety  Transition of location discussion  Jody very concerned that pt will have marked anxiety and fear if she is told about comfort measures or  plan for transition to Hospice. DO NOT DISCUSS PLAN IN FRONT OF Pt. Will monitor today and tomorrow. Would NOT transfer to Hospice facility if she is awake  and alert, as this would cause significant anxiety. I suspect, however, she will be increasingly lethargic/unresponsive and transitioning to a residential facility will not be a problem by Monday or Tuesday.  ADDENDUM: At 1450 I was called to the patient's room by the patient's family. Apparently Jill Johnson was more awake, alert, and interactive today. She was able to feed herself and had multiple questions about why she was not getting dialysis. Her sons were concerned that they made the wrong decision, as she seemed to be improving, not deteriorating. We discussed the multiple medical issues still at play (even if the delirium was better) and I reinforced the possibility that this was a prolonged "golden moment." The plan is to re-assess her tomorrow. If she continues to be awake, alert, and eating/drinking, we may need to re-address goals of care. Presently, plan to continue comfort measures and continue to monitor.  Symptoms  Pain- continue fentanyl q26min prn  Her use is not at the point that she would require a continuous infusion. Plan to continue to monitor and reinforced asking for pain medication  as needed.  Anxiety/Agitation-Sch Haldol 1mg  q6H during the day (0600, 1200, 1800)  Family is VERY concerned about her restlessness, as she wakes up with anxiety and fear. I talked about the option of scheduling agitation medication, which son Augusto Gamble agreed with. As she sleeps well overnight, I will schedule it for the daytime. I did reinforce this medication could also be given at night if needed, or held for increased sedation.   Secretions-Continue PRN Robinul  *No further PO medications at this point as pt can no longer reliably/safely swallow.  Goals of Care and Additional Recommendations:  Limitations on Scope of Treatment: Full Comfort Care  Code Status:  DNR  Prognosis:   Hours - Days given renal failure d/c'ing HD, prolonged delirium, CVA, CAD   Discharge Planning:  Anticipated  Hospital Death vs possible transfer to hospice house if stable and comfortable on Monday.    Care plan was discussed with family, RN, and primary team.  Thank you for allowing the Palliative Medicine Team to assist in the care of this patient.  Total time spent:  35 minutes    Greater than 50%  of this time was spent counseling and coordinating care related to the above assessment and plan.  Murrell Converse, AGNP-C Palliative Medicine [phone] (cell 7a-4p) [phone] (team phone 4p-7p)

## 2017-01-03 NOTE — Progress Notes (Signed)
PROGRESS NOTE  EMERLY PRAK UUV:[id number] DOB: 12-Jun-1941 DOA: 01/03/2017 PCP: Marjorie Smolder, MD   LOS: 16 days   Brief Narrative: 76 year old female with past medical history significant for end-stage renal disease on hemodialysis (TTS) started about 2 months ago, CAD status post CABG and AVR (AV replacement), hypothyroidism, hypertension, recent critical limb ischemia. Initially under cardiology primary care and [facility] consulted for evaluation of delirium. Please note patient under [facility] care starting 12/22/2016. Patient has had left leg/foot pain for past month or so prior to this admission. She saw Dr. early in clinic and the plan was for outpatient angiography. In short stay, prior to the procedure, patient started to have chest pain and EKG showed new ST depressions. Patient was taken to cath lab on 01/02/2017. She is status post cardiac catheterization with SVG to PDA DES stent placement.  Pt also underwent aortogram with bilateral LE runoff, left popliteal stenting on 12/16/2016.   During this hospital stay pt had problems with infiltration. Per renal, optimal to rest the fistula- ideally should have TDC placement but likely not possible in the setting of DAPT requirement. PCT has met with pt who expressed desire to continue HD. RIJV HD catheter placed 2/16.  More left lower extremity weakness on 2/18 reported by family. MRI brain done and found to have acute infarct in left cerebellar hemisphere. Seen by stroke team. Stroke work up completed.   Assessment & Plan: Principal Problem:   Unstable angina (HCC) Active Problems:   S/P AVR (aortic valve replacement)   Anemia, chronic disease   ESRD on hemodialysis (HCC)   Critical lower limb ischemia   Coronary artery disease involving native coronary artery of native heart with unstable angina pectoris ([place])   Other specified hypothyroidism   Goals of care, counseling/discussion   Advance care planning  Palliative care encounter   Palliative care by specialist   Cerebral thrombosis with cerebral infarction   Acute delirium   Hypokalemia   Comfort measures only status   Encounter for hospice care discussion   Terminal care   Agitated   Goals of care -Patient with significant comorbidities, end-stage renal disease on hemodialysis, not able to tolerate hemodialysis, acute stroke, recent NSTEMI, potential GI bleed,  peripheral vascular disease with critical limb ischemia, acute encephalopathy/delirium in the setting of underlying dementia.  Discussed extensively with patient's son over the phone 2/23, less likely that she will recover from this hospitalization to achieve a meaningful life.  Discussed over the phone with palliative care team [facility], as well as with Dr. Jonnie Finner from nephrology.  She is a poor long-term hemodialysis candidate.  After family meeting with palliative care on 2/23 evening, patient's care was transitioned towards comfort.  End-stage renal disease with hemodialysis / Hypokalemia -  Per renal, need to rest the fistula- ideally should have TDC placement but likely not possible in the setting of DAPT requirement. PCT has met with pt who expressed desire to continue HD - RIJV HD cath placed 2/16 per IR - HD per renal schedule - Correct potassium with HD  - Her left upper arm AV fistula is patent per vascular surgery and she is using her temporary catheter in the right internal jugular vein to allow the hematoma in her fistula to resolve. - Appreciate palliative care team following, now off dialysis given comfort approach  Acute ischemic infarct left cerebellar hemisphere -MRI brain - Punctate 5 mm acute ischemic infarct within the peripheral aspect of the inferior left cerebellar  hemisphere. No associated hemorrhage or mass effect. -2D ECHO - EF 60-65%, grade 2 DD -HgbA1c - 4.4 -Lipid panel - 27. LDL goal < 100. Patient on Lipitor 80 mg at bedtime  -Diet: renal  diet  -Appreciate neurology following - per stroke team no further aggressive stroke work up necessary  -Now comfort approach  Non-ST elevation myocardial infarction -SVG to PDA stent placement, DES.  Peripheral vascular disease with critical limb ischemia / Leukocytosis  -Per Dr. Donnetta Hutching pt is very poor surgical candidate -S/P Aortogram with bilateral LE runoff, left popliteal stenting 12/22/2016 by Dr. Sherren Mocha Early  -No symptoms in left leg at rest  Hyperlipidemia  Bioprosthetic aortic valve replacement 2011 -Recent echocardiogram in December 2017 showed normal function  Coronary artery disease status post CABG 2011 -SVG to PDA stenting  Anemia of chronic kidney disease as well as probable GI bleed in the setting of the DAPT  Hyponatremia -Likely in the setting of ESRD   Delirium -Intermittent, expect to get worse without dialysis as we are gearing towards for comfort  Hypothyroidism   Bilateral LEs and left foot toes pressure injury  - Left medial LE: 3.5 x 2 with depth obscured by the presence of eschar, no exudate - Left lateral LE: 3cm x 1cm x 0.1cm partial thickness with pink, moist base and scant serous exudate - Right LE (anterior): 2.5cm x 1.5cm x 0.2cm full thickness wound with light yellow, moist wound bed and small amount of serous exudate - Dressing procedure/placement/frequency: Comfort approach   DVT prophylaxis: heparin Code Status: DNR/DNI Family Communication: Discussed with son Jody at bedside Disposition Plan: TBD  Consultants:   Palliative  Cardiology  Nephrology  Vascular surgery  Procedures:   ESDR on HD TTS  Aortogram with bilateral LE runoff, left popliteal stenting - 01/03/2017  RIJV HF catheter placed 12/25/16  1 U PRBC transfusion 12/31/16  Antimicrobials:  None    Subjective: -She appears drowsy, sleeping  Objective: Vitals:   01/01/17 1809 01/01/17 1812 01/02/17 0500 01/02/17 0934  BP: (!) 171/45 (!) 168/36  (!)  156/34  Pulse: (!) 55   (!) 50  Resp:      Temp:    98.1 F (36.7 C)  TempSrc:    Oral  SpO2:    97%  Weight:   71.8 kg (158 lb 6.4 oz)   Height:        Intake/Output Summary (Last 24 hours) at 01/03/17 1009 Last data filed at 01/03/17 0513  Gross per 24 hour  Intake                3 ml  Output                0 ml  Net                3 ml   Filed Weights   12/31/16 0810 01/01/17 0425 01/02/17 0500  Weight: 70.5 kg (155 lb 6.8 oz) 72.1 kg (158 lb 14.4 oz) 71.8 kg (158 lb 6.4 oz)    Examination: Constitutional: Lethargic, in no apparent distress Vitals:   01/01/17 1809 01/01/17 1812 01/02/17 0500 01/02/17 0934  BP: (!) 171/45 (!) 168/36  (!) 156/34  Pulse: (!) 55   (!) 50  Resp:      Temp:    98.1 F (36.7 C)  TempSrc:    Oral  SpO2:    97%  Weight:   71.8 kg (158 lb 6.4 oz)   Height:  Data Reviewed: I have personally reviewed following labs and imaging studies  CBC:  Recent Labs Lab 12/28/16 0431 12/29/16 0658 12/30/16 0905 12/31/16 0352 01/01/17 0416  WBC 11.8* 12.3* 18.0* 17.0* 14.5*  HGB 7.5* 7.2* 7.1* 6.4* 7.8*  HCT 22.8* 21.9* 21.4* 19.3* 22.9*  MCV 98.7 100.0 100.0 101.0* 97.9  PLT 326 337 315 299 884   Basic Metabolic Panel:  Recent Labs Lab 12/28/16 0431 12/29/16 1013 12/30/16 0905 12/31/16 0352 12/31/16 0830 01/01/17 0416  NA 127* 127* 129* 127*  --  126*  K 4.2 4.5 3.2* 3.3*  --  3.3*  CL 93* 92* 92* 90*  --  92*  CO2 '25 24 26 27  '$ --  25  GLUCOSE 87 85 85 92  --  75  BUN 40* 70* 30* 39*  --  32*  CREATININE 3.63* 4.66* 2.59* 3.22*  --  2.76*  CALCIUM 7.8* 8.0* 7.8* 7.9*  --  7.5*  PHOS  --  3.8  --   --  3.1  --    GFR: Estimated Creatinine Clearance: 15.1 mL/min (by C-G formula based on SCr of 2.76 mg/dL (H)). Liver Function Tests:  Recent Labs Lab 12/29/16 1013  AST 32  ALT 7*  ALKPHOS 102  BILITOT 1.1  PROT 4.4*  ALBUMIN 1.6*   No results for input(s): LIPASE, AMYLASE in the last 168 hours. No results for  input(s): AMMONIA in the last 168 hours. Coagulation Profile: No results for input(s): INR, PROTIME in the last 168 hours. Cardiac Enzymes: No results for input(s): CKTOTAL, CKMB, CKMBINDEX, TROPONINI in the last 168 hours. BNP (last 3 results) No results for input(s): PROBNP in the last 8760 hours. HbA1C: No results for input(s): HGBA1C in the last 72 hours. CBG: No results for input(s): GLUCAP in the last 168 hours. Lipid Profile: No results for input(s): CHOL, HDL, LDLCALC, TRIG, CHOLHDL, LDLDIRECT in the last 72 hours. Thyroid Function Tests: No results for input(s): TSH, T4TOTAL, FREET4, T3FREE, THYROIDAB in the last 72 hours. Anemia Panel: No results for input(s): VITAMINB12, FOLATE, FERRITIN, TIBC, IRON, RETICCTPCT in the last 72 hours. Urine analysis:    Component Value Date/Time   COLORURINE YELLOW 10/28/2016 1600   APPEARANCEUR CLEAR 10/28/2016 1600   LABSPEC 1.011 10/28/2016 1600   PHURINE 6.5 10/28/2016 1600   GLUCOSEU NEGATIVE 10/28/2016 1600   HGBUR NEGATIVE 10/28/2016 1600   BILIRUBINUR NEGATIVE 10/28/2016 1600   KETONESUR NEGATIVE 10/28/2016 1600   PROTEINUR 30 (A) 10/28/2016 1600   UROBILINOGEN 1.0 01/30/2010 1104   NITRITE NEGATIVE 10/28/2016 1600   LEUKOCYTESUR NEGATIVE 10/28/2016 1600   Sepsis Labs: Invalid input(s): PROCALCITONIN, LACTICIDVEN  No results found for this or any previous visit (from the past 240 hour(s)).    Radiology Studies: No results found.   Scheduled Meds: . haloperidol lactate  1 mg Intravenous TID  . sodium chloride flush  3 mL Intravenous Q12H   Continuous Infusions:  Marzetta Board, MD, PhD [facility] Pager [phone]  If 7PM-7AM, please contact night-coverage [email or web address] Password [email or web address] 01/03/2017, 10:09 AM

## 2017-01-03 NOTE — Progress Notes (Signed)
Pt family refused to give po meds given Fentanyl IV push 1x, refused vital signs and wound care dressing, pt asleep no s/s of distress noted.

## 2017-01-04 DIAGNOSIS — N186 End stage renal disease: Secondary | ICD-10-CM | POA: Diagnosis not present

## 2017-01-04 DIAGNOSIS — Z992 Dependence on renal dialysis: Secondary | ICD-10-CM | POA: Diagnosis not present

## 2017-01-04 DIAGNOSIS — I120 Hypertensive chronic kidney disease with stage 5 chronic kidney disease or end stage renal disease: Secondary | ICD-10-CM | POA: Diagnosis not present

## 2017-01-04 MED ORDER — HALOPERIDOL LACTATE 5 MG/ML IJ SOLN
2.0000 mg | Freq: Once | INTRAMUSCULAR | Status: AC
  Administered 2017-01-04: 2 mg via INTRAVENOUS

## 2017-01-04 MED ORDER — SODIUM CHLORIDE 0.9 % IV SOLN
100.0000 ug/h | INTRAVENOUS | Status: DC
  Administered 2017-01-04: 300 ug/h via INTRAVENOUS
  Administered 2017-01-04: 50 ug/h via INTRAVENOUS
  Administered 2017-01-05: 300 ug/h via INTRAVENOUS
  Filled 2017-01-04 (×3): qty 50

## 2017-01-04 MED ORDER — SODIUM CHLORIDE 0.9 % IV SOLN
4.0000 mg/h | INTRAVENOUS | Status: DC
  Administered 2017-01-04 – 2017-01-05 (×2): 4 mg/h via INTRAVENOUS
  Filled 2017-01-04 (×2): qty 10

## 2017-01-04 MED ORDER — FENTANYL BOLUS VIA INFUSION
100.0000 ug | INTRAVENOUS | Status: DC | PRN
  Filled 2017-01-04: qty 100

## 2017-01-04 MED ORDER — HALOPERIDOL LACTATE 5 MG/ML IJ SOLN: 2.0000 mg | Freq: Four times a day (QID) | INTRAMUSCULAR | Status: DC | PRN

## 2017-01-04 MED ORDER — FENTANYL BOLUS VIA INFUSION
75.0000 ug | INTRAVENOUS | Status: DC | PRN
  Administered 2017-01-04 (×4): 75 ug via INTRAVENOUS
  Filled 2017-01-04: qty 75

## 2017-01-04 MED ORDER — MIDAZOLAM BOLUS VIA INFUSION
2.0000 mg | INTRAVENOUS | Status: DC | PRN
  Administered 2017-01-04: 2 mg via INTRAVENOUS
  Filled 2017-01-04: qty 2

## 2017-01-04 MED ORDER — FENTANYL BOLUS VIA INFUSION
30.0000 ug | INTRAVENOUS | Status: DC | PRN
  Administered 2017-01-04 (×4): 30 ug via INTRAVENOUS
  Filled 2017-01-04: qty 30

## 2017-01-04 MED ORDER — FENTANYL 25 MCG/HR TD PT72: 25.0000 ug | MEDICATED_PATCH | TRANSDERMAL | Status: DC

## 2017-01-04 MED ORDER — HALOPERIDOL LACTATE 5 MG/ML IJ SOLN
INTRAMUSCULAR | Status: AC
  Filled 2017-01-04: qty 1

## 2017-01-04 NOTE — Progress Notes (Signed)
PROGRESS NOTE  Silver L Ferraiolo MRN:[id number] DOB: 11/28/1940 DOA: 12/16/2016 PCP: GATES,DONNA RUTH, MD   LOS: 17 days   Brief Narrative: 76-year-old female with past medical history significant for end-stage renal disease on hemodialysis (TTS) started about 2 months ago, CAD status post CABG and AVR (AV replacement), hypothyroidism, hypertension, recent critical limb ischemia. Initially under cardiology primary care and [facility] consulted for evaluation of delirium. Please note patient under [facility] primary care starting 12/22/2016. Patient has had left leg/foot pain for past month or so prior to this admission. She saw Dr. early in clinic and the plan was for outpatient angiography. In short stay, prior to the procedure, patient started to have chest pain and EKG showed new ST depressions. Patient was taken to cath lab on 01/04/2017. She is status post cardiac catheterization with SVG to PDA DES stent placement.  Pt also underwent aortogram with bilateral LE runoff, left popliteal stenting on 01/01/2017.   During this hospital stay pt had problems with infiltration. Per renal, optimal to rest the fistula- ideally should have TDC placement but likely not possible in the setting of DAPT requirement. PCT has met with pt who expressed desire to continue HD. RIJV HD catheter placed 2/16.  More left lower extremity weakness on 2/18 reported by family. MRI brain done and found to have acute infarct in left cerebellar hemisphere. Seen by stroke team. Stroke work up completed.   Assessment & Plan: Principal Problem:   Unstable angina (HCC) Active Problems:   S/P AVR (aortic valve replacement)   Anemia, chronic disease   ESRD on hemodialysis (HCC)   Critical lower limb ischemia   Coronary artery disease involving native coronary artery of native heart with unstable angina pectoris (HCC)   Other specified hypothyroidism   Goals of care, counseling/discussion   Advance care planning  Palliative care encounter   Palliative care by specialist   Cerebral thrombosis with cerebral infarction   Acute delirium   Hypokalemia   Comfort measures only status   Encounter for hospice care discussion   Terminal care   Agitated   Goals of care -Patient with significant comorbidities, end-stage renal disease on hemodialysis, not able to tolerate hemodialysis, acute stroke, recent NSTEMI, potential GI bleed,  peripheral vascular disease with critical limb ischemia, acute encephalopathy/delirium in the setting of underlying dementia.  Discussed extensively with patient's son over the phone 2/23, less likely that she will recover from this hospitalization to achieve a meaningful life.  Discussed over the phone with palliative care team Marianne York PA, as well as with Dr. Schertz from nephrology.  She is a poor long-term hemodialysis candidate.  After family meeting with palliative care on 2/23 evening, patient's care was transitioned towards comfort. -Anticipate in-hospital death versus residential hospice if you survive over the next couple of days  End-stage renal disease with hemodialysis / Hypokalemia -  Per renal, need to rest the fistula- ideally should have TDC placement but likely not possible in the setting of DAPT requirement. PCT has met with pt who expressed desire to continue HD - RIJV HD cath placed 2/16 per IR - HD per renal schedule - Correct potassium with HD  - Her left upper arm AV fistula is patent per vascular surgery and she is using her temporary catheter in the right internal jugular vein to allow the hematoma in her fistula to resolve. - Appreciate palliative care team following, now off dialysis given comfort approach  Acute ischemic infarct left cerebellar hemisphere -MRI brain -   Punctate 5 mm acute ischemic infarct within the peripheral aspect of the inferior left cerebellar hemisphere. No associated hemorrhage or mass effect. -2D ECHO - EF 60-65%, grade 2  DD -HgbA1c - 4.4 -Lipid panel - 27. LDL goal < 100. Patient on Lipitor 80 mg at bedtime  -Diet: renal diet  -Appreciate neurology following - per stroke team no further aggressive stroke work up necessary  -Now comfort approach  Non-ST elevation myocardial infarction -SVG to PDA stent placement, DES.  Peripheral vascular disease with critical limb ischemia / Leukocytosis  -Per Dr. Early pt is very poor surgical candidate -S/P Aortogram with bilateral LE runoff, left popliteal stenting 12/22/2016 by Dr. Todd Early  -No symptoms in left leg at rest  Hyperlipidemia  Bioprosthetic aortic valve replacement 2011 -Recent echocardiogram in December 2017 showed normal function  Coronary artery disease status post CABG 2011 -SVG to PDA stenting  Anemia of chronic kidney disease as well as probable GI bleed in the setting of the DAPT  Hyponatremia -Likely in the setting of ESRD   Delirium -Intermittent, expect to get worse without dialysis as we are gearing towards for comfort  Hypothyroidism   Bilateral LEs and left foot toes pressure injury  - Left medial LE: 3.5 x 2 with depth obscured by the presence of eschar, no exudate - Left lateral LE: 3cm x 1cm x 0.1cm partial thickness with pink, moist base and scant serous exudate - Right LE (anterior): 2.5cm x 1.5cm x 0.2cm full thickness wound with light yellow, moist wound bed and small amount of serous exudate - Dressing procedure/placement/frequency: Comfort approach   DVT prophylaxis: heparin Code Status: DNR/DNI Family Communication: Discussed with son Jody at bedside Disposition Plan: TBD  Consultants:   Palliative  Cardiology  Nephrology  Vascular surgery  Procedures:   ESDR on HD TTS  Aortogram with bilateral LE runoff, left popliteal stenting - 01/02/2017  RIJV HF catheter placed 12/25/16  1 U PRBC transfusion 12/31/16  Antimicrobials:  None    Subjective: -No apparent distress, much more alert  this morning  Objective: Vitals:   01/01/17 1812 01/02/17 0500 01/02/17 0934 01/04/17 0531  BP: (!) 168/36  (!) 156/34 (!) 146/30  Pulse:   (!) 50 (!) 54  Resp:    16  Temp:   98.1 F (36.7 C) 97.5 F (36.4 C)  TempSrc:   Oral Axillary  SpO2:   97% 99%  Weight:  71.8 kg (158 lb 6.4 oz)    Height:        Intake/Output Summary (Last 24 hours) at 01/04/17 0949 Last data filed at 01/04/17 0522  Gross per 24 hour  Intake              263 ml  Output                0 ml  Net              263 ml   Filed Weights   12/31/16 0810 01/01/17 0425 01/02/17 0500  Weight: 70.5 kg (155 lb 6.8 oz) 72.1 kg (158 lb 14.4 oz) 71.8 kg (158 lb 6.4 oz)    Examination: Constitutional: Lethargic, in no apparent distress Vitals:   01/01/17 1812 01/02/17 0500 01/02/17 0934 01/04/17 0531  BP: (!) 168/36  (!) 156/34 (!) 146/30  Pulse:   (!) 50 (!) 54  Resp:    16  Temp:   98.1 F (36.7 C) 97.5 F (36.4 C)  TempSrc:   Oral Axillary    SpO2:   97% 99%  Weight:  71.8 kg (158 lb 6.4 oz)    Height:         Data Reviewed: I have personally reviewed following labs and imaging studies  CBC:  Recent Labs Lab 12/29/16 0658 12/30/16 0905 12/31/16 0352 01/01/17 0416  WBC 12.3* 18.0* 17.0* 14.5*  HGB 7.2* 7.1* 6.4* 7.8*  HCT 21.9* 21.4* 19.3* 22.9*  MCV 100.0 100.0 101.0* 97.9  PLT 337 315 299 235   Basic Metabolic Panel:  Recent Labs Lab 12/29/16 1013 12/30/16 0905 12/31/16 0352 12/31/16 0830 01/01/17 0416  NA 127* 129* 127*  --  126*  K 4.5 3.2* 3.3*  --  3.3*  CL 92* 92* 90*  --  92*  CO2 24 26 27  --  25  GLUCOSE 85 85 92  --  75  BUN 70* 30* 39*  --  32*  CREATININE 4.66* 2.59* 3.22*  --  2.76*  CALCIUM 8.0* 7.8* 7.9*  --  7.5*  PHOS 3.8  --   --  3.1  --    GFR: Estimated Creatinine Clearance: 15.1 mL/min (by C-G formula based on SCr of 2.76 mg/dL (H)). Liver Function Tests:  Recent Labs Lab 12/29/16 1013  AST 32  ALT 7*  ALKPHOS 102  BILITOT 1.1  PROT 4.4*    ALBUMIN 1.6*   No results for input(s): LIPASE, AMYLASE in the last 168 hours. No results for input(s): AMMONIA in the last 168 hours. Coagulation Profile: No results for input(s): INR, PROTIME in the last 168 hours. Cardiac Enzymes: No results for input(s): CKTOTAL, CKMB, CKMBINDEX, TROPONINI in the last 168 hours. BNP (last 3 results) No results for input(s): PROBNP in the last 8760 hours. HbA1C: No results for input(s): HGBA1C in the last 72 hours. CBG: No results for input(s): GLUCAP in the last 168 hours. Lipid Profile: No results for input(s): CHOL, HDL, LDLCALC, TRIG, CHOLHDL, LDLDIRECT in the last 72 hours. Thyroid Function Tests: No results for input(s): TSH, T4TOTAL, FREET4, T3FREE, THYROIDAB in the last 72 hours. Anemia Panel: No results for input(s): VITAMINB12, FOLATE, FERRITIN, TIBC, IRON, RETICCTPCT in the last 72 hours. Urine analysis:    Component Value Date/Time   COLORURINE YELLOW 10/28/2016 1600   APPEARANCEUR CLEAR 10/28/2016 1600   LABSPEC 1.011 10/28/2016 1600   PHURINE 6.5 10/28/2016 1600   GLUCOSEU NEGATIVE 10/28/2016 1600   HGBUR NEGATIVE 10/28/2016 1600   BILIRUBINUR NEGATIVE 10/28/2016 1600   KETONESUR NEGATIVE 10/28/2016 1600   PROTEINUR 30 (A) 10/28/2016 1600   UROBILINOGEN 1.0 01/30/2010 1104   NITRITE NEGATIVE 10/28/2016 1600   LEUKOCYTESUR NEGATIVE 10/28/2016 1600   Sepsis Labs: Invalid input(s): PROCALCITONIN, LACTICIDVEN  No results found for this or any previous visit (from the past 240 hour(s)).    Radiology Studies: No results found.   Scheduled Meds: . haloperidol lactate  1 mg Intravenous TID  . sodium chloride flush  3 mL Intravenous Q12H   Continuous Infusions: . fentaNYL infusion INTRAVENOUS      , MD, PhD [facility] Pager [phone]  If 7PM-7AM, please contact night-coverage [email or web address] Password [email or web address] 01/04/2017, 9:49 AM     

## 2017-01-04 NOTE — Progress Notes (Signed)
Palliative Medicine RN Note: Afternoon follow up. Pt is drowsy; RN has recently adjusted and given fentanyl. Asked pt about foley; she refused. Discussed plan to ensure all comfort meds in place now in case of changes overnight and for PMT follow up in am. Pain is not well controlled and is requiring multiple visits daily by PMT.   DIL spoke to me outside of room about pt's prognosis and what to expect; pt's son has a special needs daughter on a vent at home, and they are trying to time their nurses to Mrs. Rosser's needs. Answered questions as best as possible within my scope of practice. DIL satisfied with answers.   Obt[place] orders from [place], [place] medications. Pt's prognosis is very short, likely hours to a day or so. Her symptoms are changing, requiring frequent changes to medication regimen. Breathing is shallow when she is not stimulated.   Margret ChanceMelanie G. Kaytlyn Din, RN, BSN, [facility] 01/04/2017 2:50 PM Cell [phone] 8:00-4:00 Monday-Friday Office [phone]

## 2017-01-04 NOTE — Progress Notes (Signed)
York, PA notified patient is still VERY anxious. Orders received. Will continue to monitor patient. Family made aware

## 2017-01-04 NOTE — Progress Notes (Signed)
Pt family refused to do the wound dressing since 1920 and midnight, pt asked for bedpan but when I asked if she is done she said not yet, son at bedside made aware, I checked it again no bowel movement noted, endorsed to incoming nurse, no s/s of distress at this time.

## 2017-01-04 NOTE — Progress Notes (Signed)
No charge note.  Received a call from bedside RN Silva BandyKristi - Mrs. Wagenaar is yelling out and uncomfortable despite receiving multiple boluses of fentanyl.   We added a PRN dose of Haldol.  Received a second call from bedside RN that Mrs. Beshara is yelling out and uncomfortable despite receiving 4 fentanyl boluses, increasing the gtt to 200 mcg and giving haldol.   After discussion with PMT team will start Versed gtt with PRN bolus.   Algis DownsMarianne York, [place]-C Palliative Medicine Pager: [phone]

## 2017-01-04 NOTE — Progress Notes (Signed)
Daily Progress Note   Patient Name: Jill Johnson       Date: 01/04/2017 DOB: 08-13-1941  Age: 76 y.o. MRN#: [id number] Attending Physician: Leatha Gilding, MD Primary Care Physician: Hollice Espy, MD Admit Date: 01/04/2017  Reason for Consultation/Follow-up: Establishing goals of care given NSTEMI, critical limb ischemia, CVA, ESRD and prolonged altered mental status.  Subjective: Jill Johnson has the gift of clarity this morning.  She has had a fairly good weekend.  After some conversation, Jill Johnson tells me she is dying.  She is in constant pain.  She knows she is likely not a surgical candidate but even if she was she would not want an amputation of her left foot.  We talked about HD.  She does not want to continue hemodialysis any longer.    Her son Jill Johnson is at bedside with [place] an hears this conversation.  He is relieved.  She complains frequently about not being able to get her breath and about the pain "all over"  Outside the room I explained to Jill Johnson that she is becoming SOB due to the accumulation of fluid.  We discussed giving Jill Johnson lasix and possibly a foley cath, but Jill Johnson insists that she would not want the foley and frequent cleanings would be too painful.  I explained that a drip will be necessary in order to help eliminate her distress.  Patient Profile/HPI:  76 y.o. female  with past medical history of ESRD (on HD), HLD, HTN, CAD s/p CABG (2011), TIA, CVA, critical limb ischemia admitted on 12/28/2016 with chest pain. She had presented for scheduled lower limb angiography, however, before the procedure she was found to have chest pain and EKG revealed ST depression. She is now s/p cardiac cath with stent placement. She has continued to have altered mental status and a  small stroke was found on MRI.  She is tolerating HD, but is bradycardic and lethargic. Overnight 2/22 the patient's hgb dropped to 6.4 and a blood transfusion was ordered.  Length of Stay: 17  Current Medications: Scheduled Meds:  . fentaNYL  25 mcg Transdermal Q72H  . haloperidol lactate  1 mg Intravenous TID  . sodium chloride flush  3 mL Intravenous Q12H    Continuous Infusions: . fentaNYL infusion INTRAVENOUS      PRN Meds: sodium  chloride, antiseptic oral rinse, bisacodyl, fentaNYL (SUBLIMAZE) injection, [DISCONTINUED] glycopyrrolate **OR** [DISCONTINUED] glycopyrrolate **OR** glycopyrrolate, ondansetron (ZOFRAN) IV, polyvinyl alcohol, sodium chloride flush  Physical Exam     Well developed elderly female.  Awake, leaning to the left side of the bed.  Complaining of pain. CV rrr, ++ murmur Resp no distress Abdomen soft. Swelling in all 4 extremities.  LLE with gangrenous toes.  Vital Signs: BP (!) 146/30 (BP Location: Right Arm)   Pulse (!) 54   Temp 97.5 F (36.4 C) (Axillary)   Resp 16   Ht 5' (1.524 m)   Wt 71.8 kg (158 lb 6.4 oz)   SpO2 99%   BMI 30.94 kg/m  SpO2: SpO2: 99 % O2 Device: O2 Device: Not Delivered O2 Flow Rate: O2 Flow Rate (L/min): 2 L/min  Intake/output summary:   Intake/Output Summary (Last 24 hours) at 01/04/17 0911 Last data filed at 01/04/17 0522  Gross per 24 hour  Intake              263 ml  Output                0 ml  Net              263 ml   LBM: Last BM Date: 12/30/16 Baseline Weight: Weight: 71.7 kg (158 lb) Most recent weight: Weight: 71.8 kg (158 lb 6.4 oz)       Palliative Assessment/Data:    Flowsheet Rows   Flowsheet Row Most Recent Value  Intake Tab  Referral Department  Hospitalist  Unit at Time of Referral  Cardiac/Telemetry Unit  Palliative Care Primary Diagnosis  Other (Comment)  Date Notified  12/22/16  Palliative Care Type  New Palliative care  Reason for referral  Clarify Goals of Care  Date of  Admission  12/22/2016  # of days IP prior to Palliative referral  4  Clinical Assessment  Palliative Performance Scale Score  20%  Psychosocial & Spiritual Assessment  Palliative Care Outcomes  Patient/Family meeting held?  Yes  Who was at the meeting?  2 sons, sister, dtr in law  Palliative Care Outcomes  Clarified goals of care, Counseled regarding hospice      Patient Active Problem List   Diagnosis Date Noted  . Terminal care   . Agitated   . Comfort measures only status   . Encounter for hospice care discussion   . Acute delirium   . Hypokalemia   . Cerebral thrombosis with cerebral infarction 12/29/2016  . Palliative care by specialist   . Palliative care encounter   . Other specified hypothyroidism   . Goals of care, counseling/discussion   . Advance care planning   . Unstable angina (HCC) 12/22/2016  . Anemia, chronic disease 12/20/2016  . ESRD on hemodialysis (HCC) 12/11/2016  . PAD (peripheral artery disease) (HCC) 01/03/2017  . Critical lower limb ischemia 12/25/2016  . Coronary artery disease involving native coronary artery of native heart with unstable angina pectoris (HCC) 12/25/2016  . Chest pain 10/28/2016  . Fluid overload 10/28/2016  . Elevated troponin 10/28/2016  . Symptomatic anemia 10/28/2016  . ESRD (end stage renal disease) (HCC) 10/28/2016  . Hypothyroidism 10/28/2016  . Respiratory distress 10/28/2016  . Acute diastolic heart failure (HCC) 12/20/2015  . Obesity 12/20/2015  . Coronary artery disease due to lipid rich plaque 12/20/2015  . S/P AVR (aortic valve replacement) 12/20/2015  . Chronic kidney disease, stage 3 12/20/2015    Palliative Care Plan    Recommendations/Plan:  Full comfort care Starting fentanyl gtt for pain and impending respiratory distress. Oxygen for comfort. Not stable for transport at this point - she is having severe pain.  Will reassess 2/27.  Providers please speak directly with two sons - Jill Johnson and  [place]   Goals of Care and Additional Recommendations:  Limitations on Scope of Treatment: Full Comfort Care  Code Status:  DNR  Prognosis:   Hours - Days given renal failure d/c'ing HD, prolonged delirium, CVA, CAD   Discharge Planning:  Anticipated Hospital Death vs possible transfer to hospice house if stable and comfortable.  Care plan was discussed with [facility] MD, Jill Johnson Family and bedside RN  Thank you for allowing the Palliative Medicine Team to assist in the care of this patient.  Total time spent:  35 min.     Greater than 50%  of this time was spent counseling and coordinating care related to the above assessment and plan.  Algis Downs, PA-C Palliative Medicine  Please contact Palliative MedicineTeam phone at [phone] for questions and concerns between 7 am - 7 pm.   Please see AMION for individual provider pager numbers.

## 2017-01-04 NOTE — Care Management Important Message (Signed)
Important Message  Patient Details  Name: Jill Johnson MRN: [id number] Date of Birth: 1941-05-25   Medicare Important Message Given:  Yes    [place] Stefan ChurchBratton 01/04/2017, 3:52 PM

## 2017-01-05 ENCOUNTER — Encounter: Payer: Commercial Managed Care - HMO | Admitting: Vascular Surgery

## 2017-01-07 NOTE — Progress Notes (Addendum)
Removed oxygen.  Shortly after Mrs. Anzalone passed very peacefully.  Dtr in law at bedside.  Total time 20 min.  Algis DownsMarianne York, [place]PA-C Palliative Medicine Pager: [phone]

## 2017-01-07 NOTE — Discharge Summary (Addendum)
Death Summary  Jill Johnson ZOX:[id number] DOB: May 23, 1941 DOA: 2017-01-04  PCP: Hollice Espy, MD  Admit date: 2017-01-04 Date of Death: 2017-01-22 Time of Death: 07:50 Notification: Hollice Espy, MD notified of death of 01/22/2017   History of present illness / hospital course   76 year old female with past medical history significant for end-stage renal disease on hemodialysis (TTS) started about 2 months ago, CAD status post CABG and AVR (AV replacement), hypothyroidism, hypertension, recent critical limb ischemia. Initially under cardiology primary care and [facility] consulted for evaluation of delirium. Please note patient under [facility] primary care starting 12/22/2016. Patient has had left leg/foot pain for past month or so prior to this admission. She saw Dr. Arbie Cookey in clinic and the plan was for outpatient angiography. In short stay, prior to the procedure, patient started to have chest pain and EKG showed new ST depressions. Patient was taken to cath lab on 01-04-17. She is status post cardiac catheterization with SVG to PDA DES stent placement. Pt also underwent aortogram with bilateral LE runoff, left popliteal stenting on 12/19/2016. Course complicated also by left lower extremity weakness on 2/18. MRI brain done and found to have acute infarct in left cerebellar hemisphere.  Goals of care -Patient with significant comorbidities, end-stage renal disease on hemodialysis, not able to tolerate hemodialysis and not a good candidate for long-term hemodialysis, acute stroke, recent NSTEMI, potential GI bleed,  peripheral vascular disease with critical limb ischemia, acute encephalopathy/delirium in the setting of underlying dementia.  Palliative care was consulted, and after discussions with family decision was made to transition patient's care towards comfort, patient passed away on 2017-01-22 at 7:50 in the morning End-stage renal disease with hemodialysis - Appreciate palliative  care team following, now off dialysis given comfort approach Acute ischemic infarct left cerebellar hemisphere -MRI brain - Punctate 5 mm acute ischemic infarct within the peripheral aspect of the inferior left cerebellar hemisphere. No associated hemorrhage or mass effect. Non-ST elevation myocardial infarction -SVG to PDA stent placement, DES. Peripheral vascular disease with critical limb ischemia / Leukocytosis  -Per Dr. Arbie Cookey pt is very poor surgical candidate -S/P Aortogram with bilateral LE runoff, left popliteal stenting 01/02/2017 by Dr. Tawanna Cooler Early  Hyperlipidemia Bioprosthetic aortic valve replacement 2011 -Recent echocardiogram in December 2017 showed normal function Coronary artery disease status post CABG 2011 -SVG to PDA stenting Anemia of chronic kidney disease as well as probable GI bleed in the setting of the DAPT Hyponatremia -Likely in the setting of ESRD  Delirium Hypothyroidism  Bilateral LEs and left foot toes pressure injury  - Left medial LE: 3.5 x 2 with depth obscured by the presence of eschar - Left lateral LE: 3cm x 1cm x 0.1cm partial thickness - Right LE (anterior): 2.5cm x 1.5cm x 0.2cm full thickness wound  The results of significant diagnostics from this hospitalization (including imaging, microbiology, ancillary and laboratory) are listed below for reference.    Significant Diagnostic Studies: Mr Brain Wo Contrast  Result Date: 12/27/2016 CLINICAL DATA:  Initial evaluation for acute left-sided weakness, lethargy. EXAM: MRI HEAD WITHOUT CONTRAST TECHNIQUE: Multiplanar, multiecho pulse sequences of the brain and surrounding structures were obtained without intravenous contrast. COMPARISON:  None available. FINDINGS: Brain: Diffuse prominence of the CSF containing spaces is compatible with generalized age-related cerebral atrophy. Patchy and confluent T2/FLAIR hyperintensity within the periventricular and deep white matter both cerebral hemispheres most  compatible with chronic small vessel ischemic disease, moderate nature. Chronic microvascular ischemic changes present within the pons as well. Superimposed  remote lacunar infarcts present within the bilateral basal ganglia and pons. There is a remote cortical/ subcortical infarct within the posterior right frontal parietal region near the vertex. Focal encephalomalacia at the anterior right temporal pole may be related to remote ischemia or possibly trauma. There is a punctate 5 mm focus of restricted diffusion within the peripheral aspect of the inferior left cerebellar hemisphere (series 4, image 5). Consistent with a tiny acute ischemic infarct. No associated hemorrhage or mass effect. No other evidence for acute or subacute ischemia. Gray-white matter differentiation otherwise maintained. Few scattered foci of susceptibility artifact present within the bilateral cerebral hemispheres, with additional prominent focus within the right cerebellar hemisphere. Findings most consistent with small chronic micro hemorrhages, most likely hypertensive in nature. No mass lesion, midline shift or mass effect. No hydrocephalus. No extra-axial fluid collection. Major dural sinuses are grossly patent. 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 Vascular: Abnormal flow void within the distal left vertebral artery, which may be related to slow flow and/ or occlusion. Major intracranial vascular flow voids otherwise maintained. Skull and upper cervical spine: Degenerative thickening of the tectorial membrane noted without significant stenosis. Remainder the visualized upper cervical spine otherwise unremarkable. Craniocervical junction otherwise unremarkable. Bone marrow signal intensity within normal limits. No scalp soft tissue abnormality. Sinuses/Orbits: Globes and orbital soft tissues within normal limits. Mild scattered mucosal thickening within the ethmoidal air cells and left sphenoid sinus. Paranasal sinuses are otherwise  clear. Partially visualized left parotid gland is larger in size as compared to the partially visualized right, of uncertain significance. Additionally, there is mildly increased associated diffusion signal is compared to the contralateral right. Question acute parotitis. IMPRESSION: 1. Punctate 5 mm acute ischemic infarct within the peripheral aspect of the inferior left cerebellar hemisphere. No associated hemorrhage or mass effect. 2. Generalized cerebral atrophy with moderate chronic microvascular ischemic disease and multiple remote infarcts as detailed above. 3. Few scattered foci of susceptibility artifact involving the supratentorial brain and right cerebellar hemisphere, consistent with small chronic micro hemorrhages. These are most commonly related to chronic underlying hypertension. 4. Apparent asymmetric enlargement of the partially visualized left parotid gland, indeterminate. Correlation with physical exam for possible acute parotitis recommended. Electronically Signed   By: Benjamin  McRise Mulintock M.D.   On: 12/27/2016 23:44   Ir Fluoro Guide Cv Line Right  Result Date: 12/25/2016 INDICATION: Renal failure EXAM: IR RIGHT FLOURO GUIDE CV LINE; IR ULTRASOUND GUIDANCE VASC ACCESS RIGHT MEDICATIONS: None. ANESTHESIA/SEDATION: None. FLUOROSCOPY TIME:  Fluoroscopy Time: 4 minutes 30 seconds (26 mGy). COMPLICATIONS: None immediate. PROCEDURE: Informed written consent was obtained from the patient after a thorough discussion of the procedural risks, benefits and alternatives. All questions were addressed. Maximal Sterile Barrier Technique was utilized including caps, mask, sterile gowns, sterile gloves, sterile drape, hand hygiene and skin antiseptic. A timeout was performed prior to the initiation of the procedure. The right neck was prepped and draped in a sterile fashion. 1% lidocaine was utilized for local anesthesia. Under sonographic guidance, a micropuncture needle was inserted into the right jugular  vein and up sized to an Amplatz wire. The dilator followed by the temporary dialysis catheter were advanced over the wire to the cavoatrial junction. It was flushed and sewn in place with an 0 Prolene knot. FINDINGS: A temporary right jugular dialysis catheter has been placed with its tip at the cavoatrial junction. IMPRESSION: Successful temporary right jugular dialysis catheter placement with its tip at the cavoatrial junction. Electronically Signed   By: Merton BorderArthur  Hoss M.D.   On: 12/25/2016 16:40   Ir US Guide Vasc Access Right  Result Date: 12/25/2016 INDICATION: Renal failure EXAM: IR RIGHT FLOURO GUIDE CV LINE; IR ULTRASOUND GUIDANCE VASC ACCESS RIGHT MEDICATIONS: None. ANESTHESIA/SEDATION: None. FLUOROSCOPY TIME:  Fluoroscopy Time: 4 minutes 30 seconds (26 mGy). COMPLICATIONS: None immediate. PROCEDURE: Informed written consent was obtained from the patient after a thorough discussion of the procedural risks, benefits and alternatives. All questions were addressed. Maximal Sterile Barrier Technique was utilized including caps, mask, sterile gowns, sterile gloves, sterile drape, hand hygiene and skin antiseptic. A timeout was performed prior to the initiation of the procedure. The right neck was prepped and draped in a sterile fashion. 1% lidocaine was utilized for local anesthesia. Under sonographic guidance, a micropuncture needle was inserted into the right jugular vein and up sized to an Amplatz wire. The dilator followed by the temporary dialysis catheter were advanced over the wire to the cavoatrial junction. It was flushed and sewn in place with an 0 Prolene knot. FINDINGS: A temporary right jugular dialysis catheter has been placed with its tip at the cavoatrial junction. IMPRESSION: Successful temporary right jugular dialysis catheter placement with its tip at the cavoatrial junction. Electronically Signed   By: Jolaine Click M.D.   On: 12/25/2016 16:40   Dg Chest Port 1 View  Result Date:  12/19/2016 CLINICAL DATA:  Early termination of dialysis diminished lung sounds EXAM: PORTABLE CHEST 1 VIEW COMPARISON:  10/28/2016 FINDINGS: Post sternotomy changes and valvular prosthesis. Cardiomegaly is present. Central vascular congestion without overt edema. Possible small left pleural effusion. Atherosclerosis. No pneumothorax. IMPRESSION: Cardiomegaly with central vascular congestion. Possible small left effusion. No overt pulmonary edema. Electronically Signed   By: Jasmine Pang M.D.   On: 12/19/2016 23:59   Dg Hip Unilat With Pelvis 2-3 Views Right  Result Date: 12/28/2016 CLINICAL DATA:  Generalized right hip pain.  Bruising. EXAM: DG HIP (WITH OR WITHOUT PELVIS) 2-3V RIGHT COMPARISON:  None. FINDINGS: Dense vascular calcifications are identified. Osteopenia. No hip fracture or dislocation. No bony lesions. Mild degenerative changes in the right hip. A true lateral view was not obtained. IMPRESSION: No fracture or dislocation.  Mild degenerative changes. Electronically Signed   By: Gerome Sam III M.D   On: 12/28/2016 17:45    Microbiology: No results found for this or any previous visit (from the past 240 hour(s)).   Labs: Basic Metabolic Panel:  Recent Labs Lab 12/30/16 0905 12/31/16 0352 12/31/16 0830 01/01/17 0416  NA 129* 127*  --  126*  K 3.2* 3.3*  --  3.3*  CL 92* 90*  --  92*  CO2 26 27  --  25  GLUCOSE 85 92  --  75  BUN 30* 39*  --  32*  CREATININE 2.59* 3.22*  --  2.76*  CALCIUM 7.8* 7.9*  --  7.5*  PHOS  --   --  3.1  --    CBC:  Recent Labs Lab 12/30/16 0905 12/31/16 0352 01/01/17 0416  WBC 18.0* 17.0* 14.5*  HGB 7.1* 6.4* 7.8*  HCT 21.4* 19.3* 22.9*  MCV 100.0 101.0* 97.9  PLT 315 299 235   Urinalysis    Component Value Date/Time   COLORURINE YELLOW 10/28/2016 1600   APPEARANCEUR CLEAR 10/28/2016 1600   LABSPEC 1.011 10/28/2016 1600   PHURINE 6.5 10/28/2016 1600   GLUCOSEU NEGATIVE 10/28/2016 1600   HGBUR NEGATIVE 10/28/2016 1600    BILIRUBINUR NEGATIVE 10/28/2016 1600   KETONESUR NEGATIVE 10/28/2016 1600  PROTEINUR 30 (A) 10/28/2016 1600   UROBILINOGEN 1.0 01/30/2010 1104   NITRITE NEGATIVE 10/28/2016 1600   LEUKOCYTESUR NEGATIVE 10/28/2016 1600    SIGNED:  Pamella Pert, MD  [facility] 12/14/2016, 10:33 AM Pager   If 7PM-7AM, please contact night-coverage [email or web address] Password [email or web address]

## 2017-01-07 NOTE — Progress Notes (Addendum)
[id number] pt has no respiration, no pulse, as witnessed by Algis DownsMarianne York palliative Pa.  Pronounced dead by 2 Rns at 0755. Daughter in law at the bedside. Message sent to Dr Elvera LennoxGherghe. Pt's 2 sons came in and they provided funeral home information. 1045 Post mortem care completed. Pt to morgue. Wasted remaining Fentanyl 100 ml and Versed 25 ml witnessed by Malen Gauzearol Huckabee RN.

## 2017-01-07 NOTE — Progress Notes (Signed)
Witnessed waste of fentanyl and versed by Shanda BumpsMarissa Serrano, RN

## 2017-01-07 DEATH — deceased

## 2017-01-08 ENCOUNTER — Ambulatory Visit: Payer: Medicare HMO | Admitting: Cardiology

## 2017-06-03 IMAGING — CR DG CHEST 2V
2 series · 2 of 2 positions shown · non-contrast
Comparison: 02/24/2010

CLINICAL DATA: Anorexia, weight loss, shortness of breath with
exertion.

EXAM:
CHEST  2 VIEW

[w chest lat]
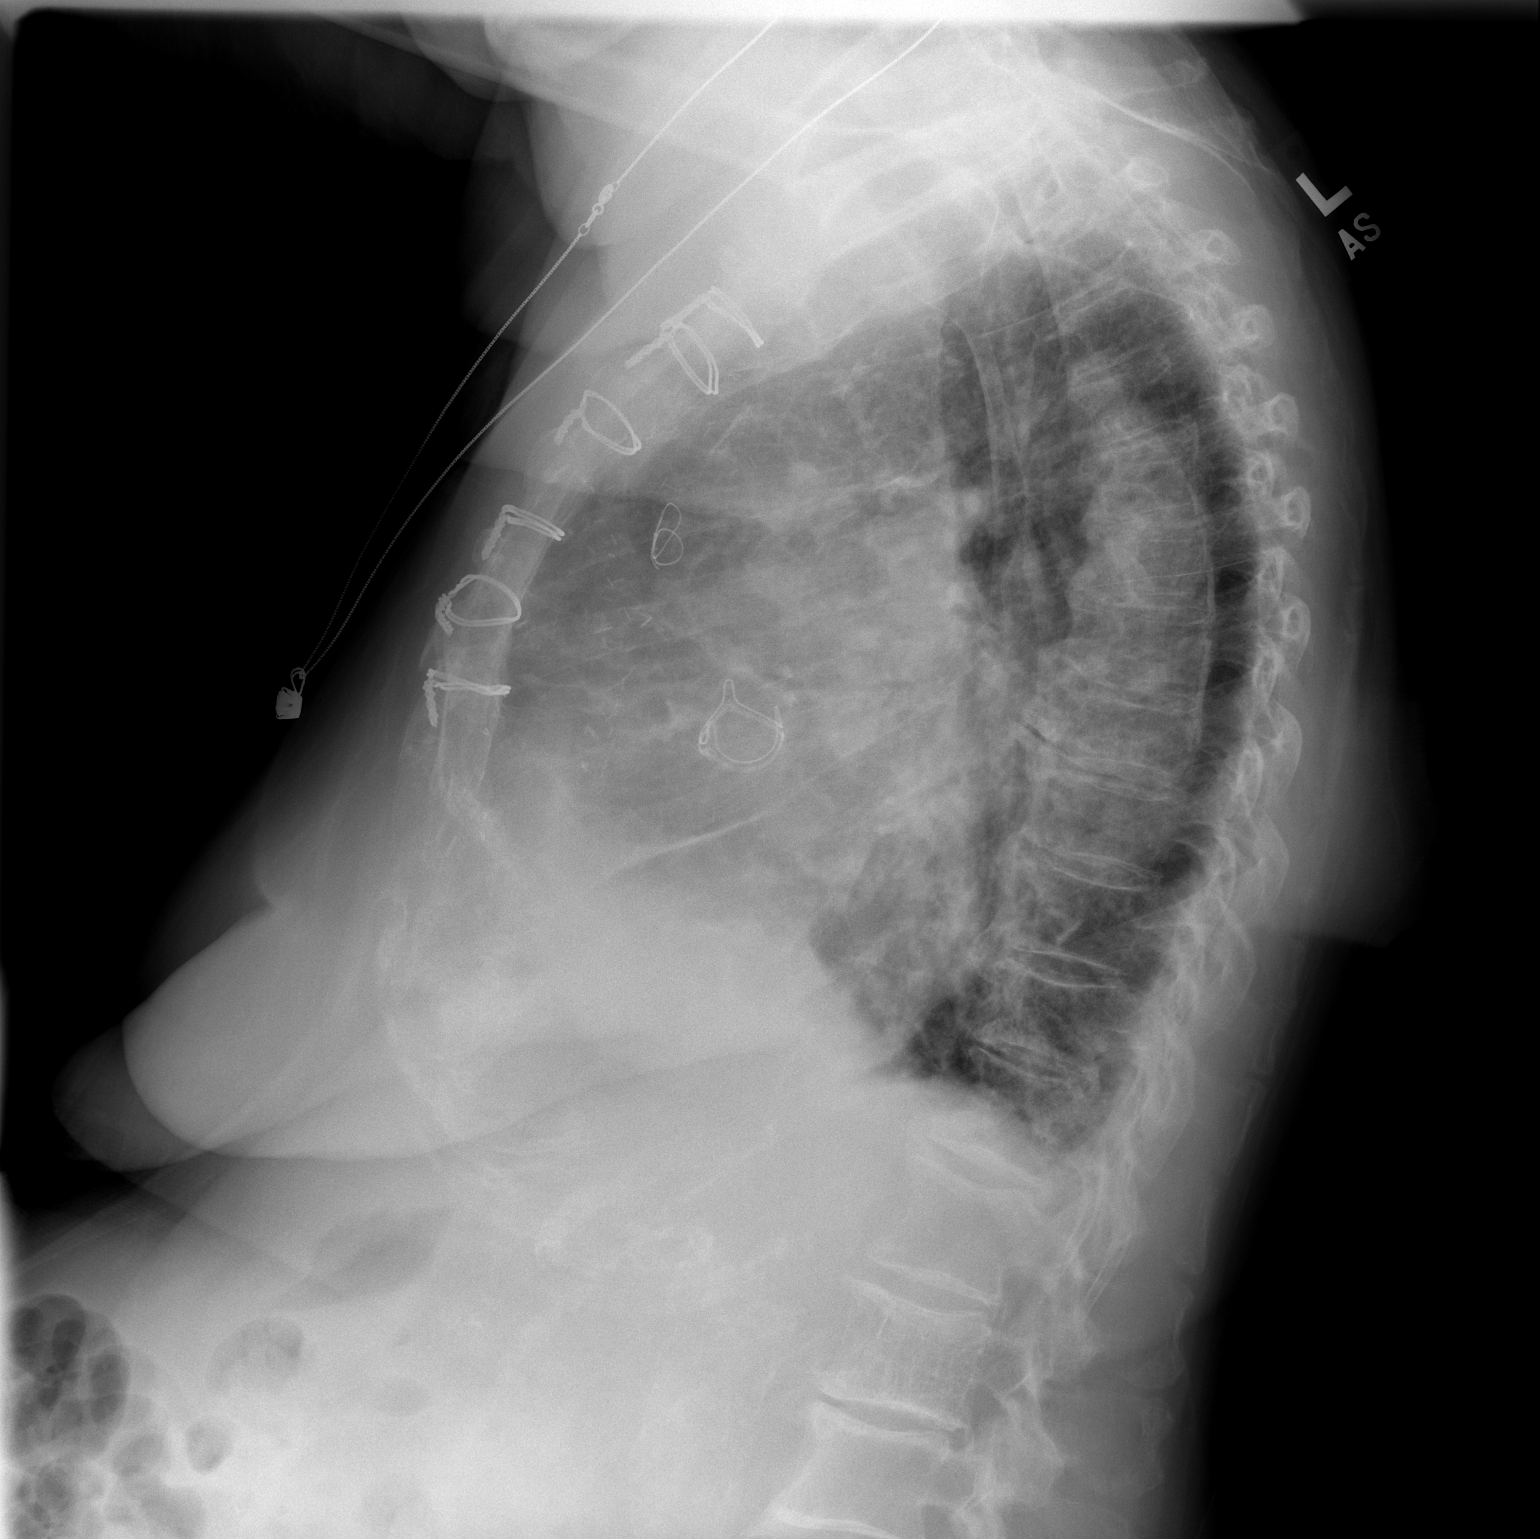

[w chest ap]
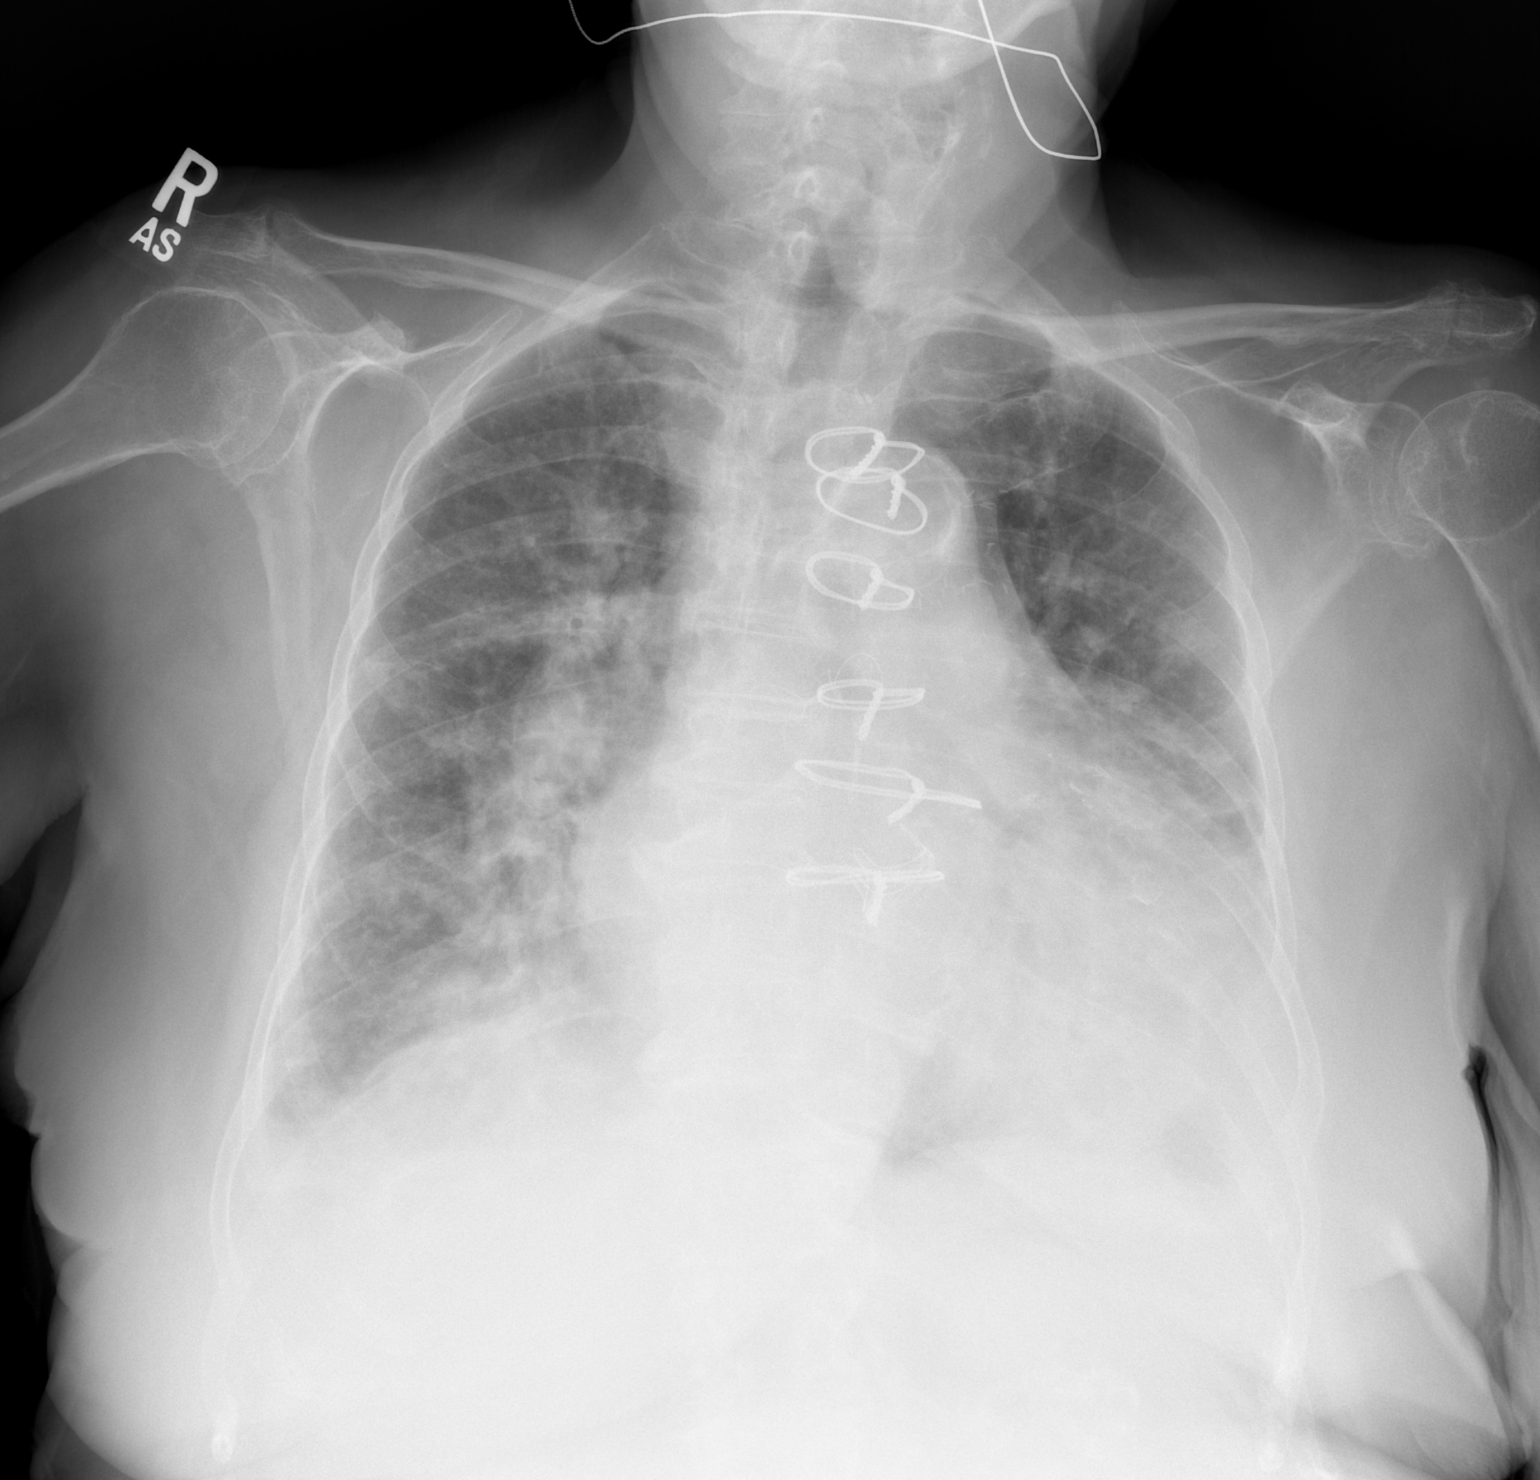

[2 of 2 positions shown; findings below may reference images not displayed]

FINDINGS: Cardiomegaly. Vascular congestion and bilateral airspace opacities
most compatible with edema. Small bilateral effusions. No acute bony
abnormality.
IMPRESSION: Findings most compatible with moderate CHF.

## 2017-06-25 IMAGING — US US RENAL
1 series · 14 of 25 positions shown · non-contrast
Comparison: None.

CLINICAL DATA: Chronic renal disease.  Initial encounter.

EXAM:
RENAL / URINARY TRACT ULTRASOUND COMPLETE

[Series 1: us renal · 0.28mm/px · 14 of 42 slices shown]
[im 1/42]
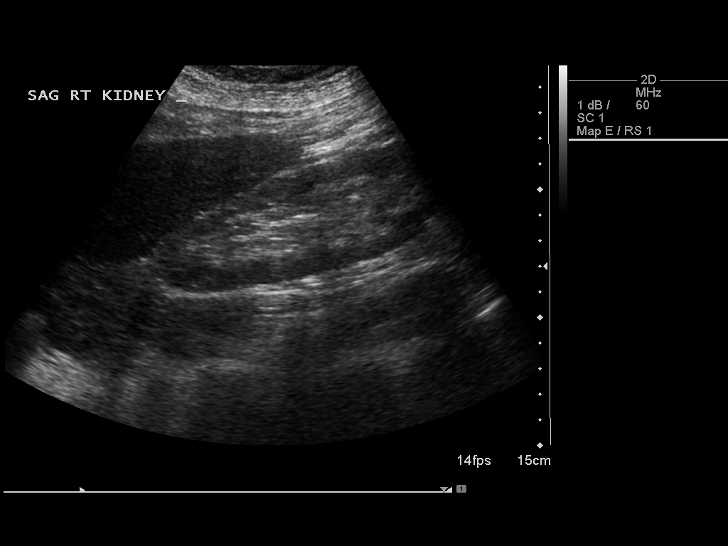
[im 4/42]
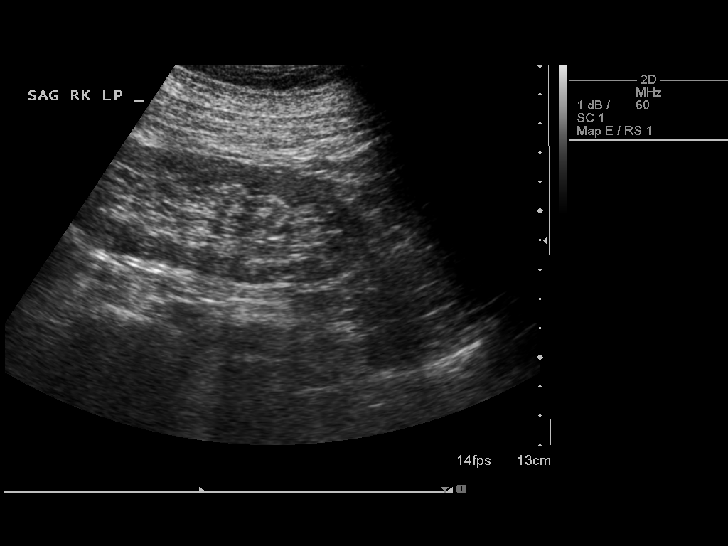
[im 7/42]
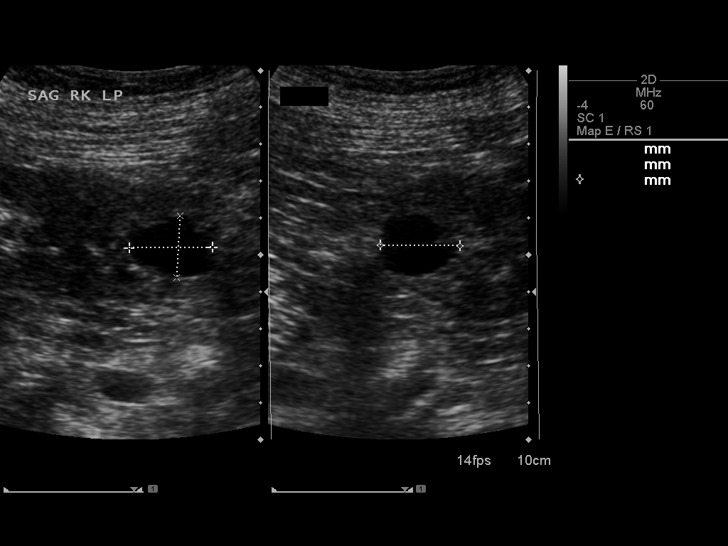
[im 11/42]
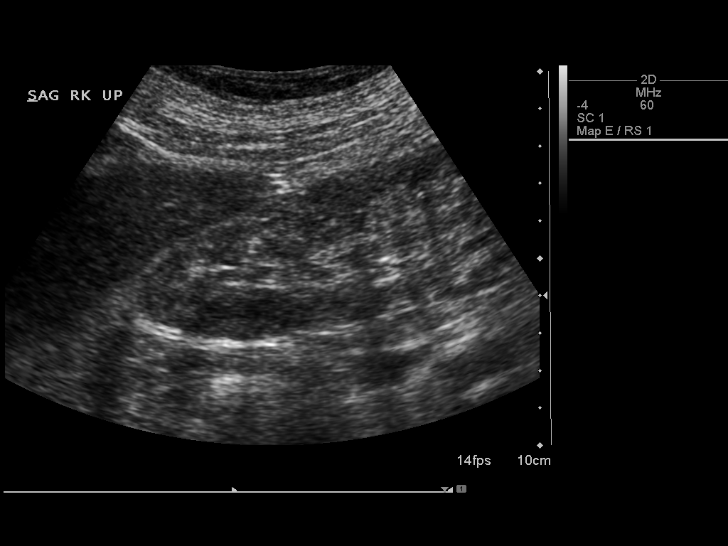
[im 14/42]
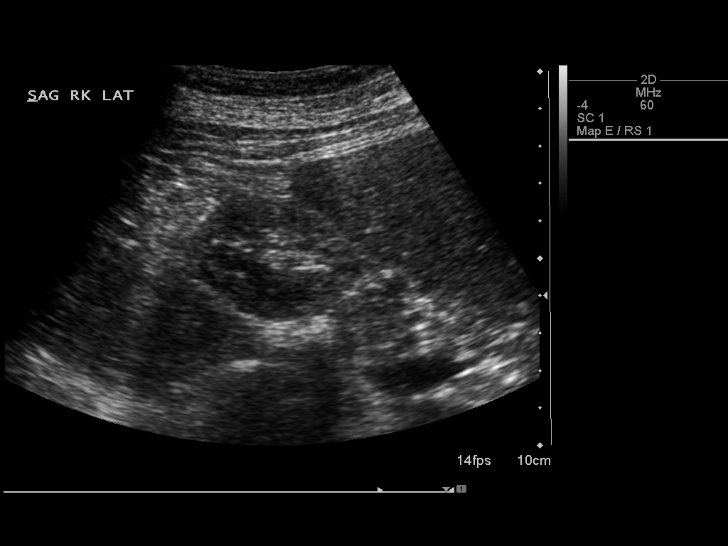
[im 16/42]
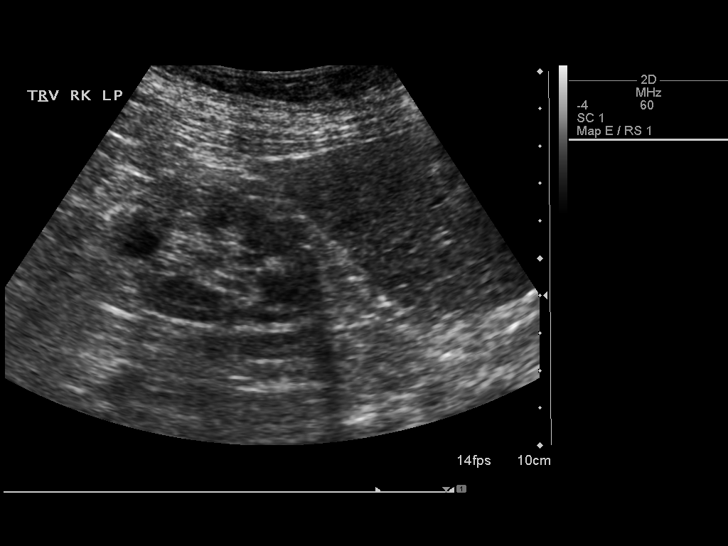
[im 19/42]
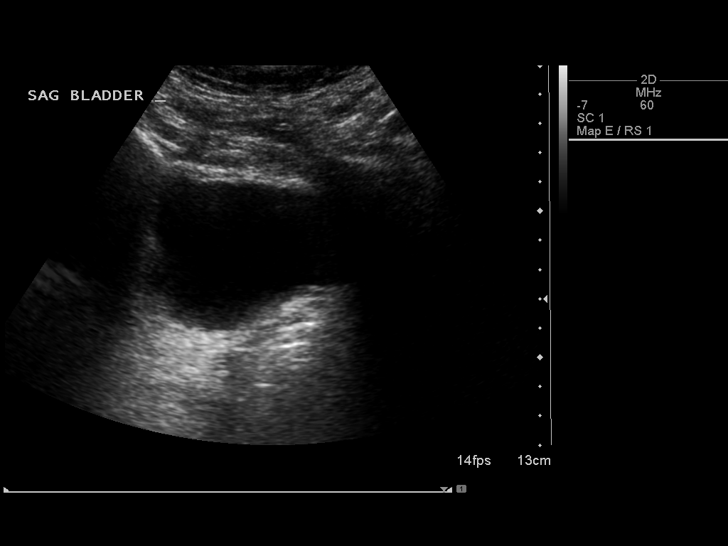
[im 23/42]
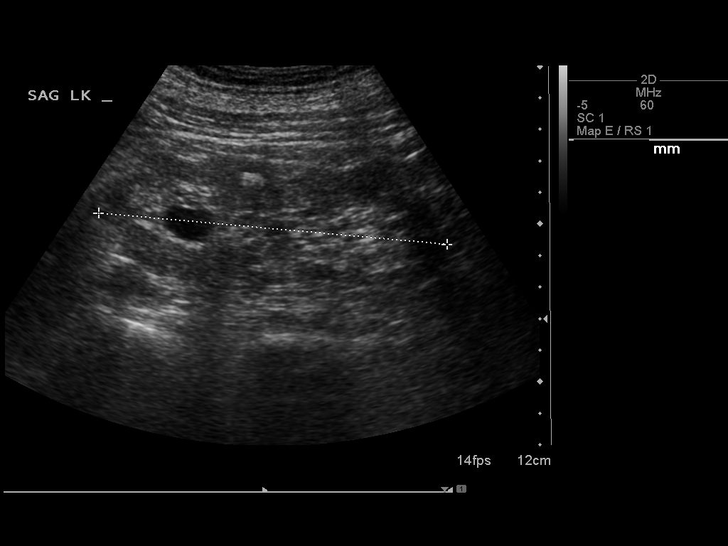
[im 26/42]
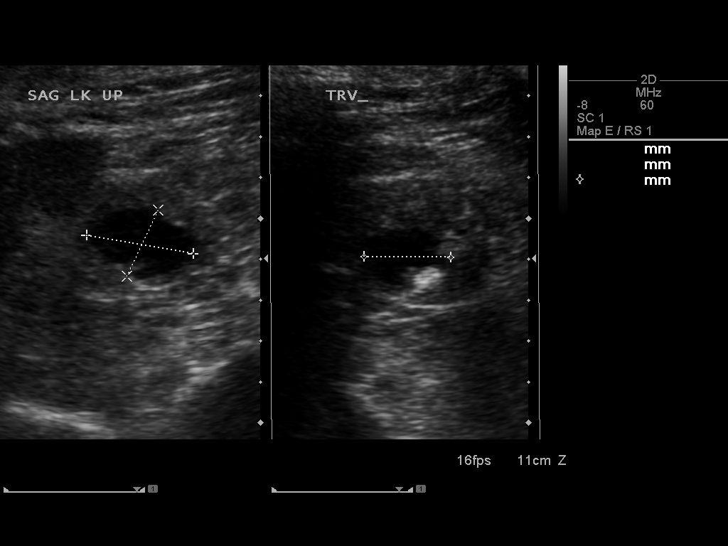
[im 28/42]
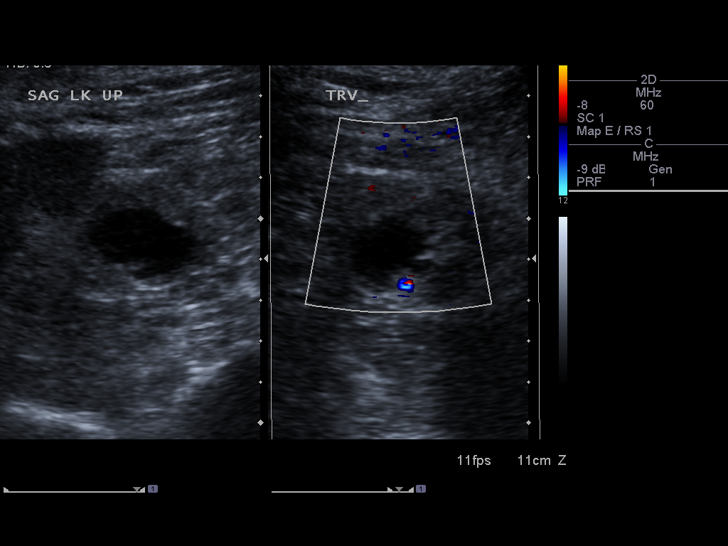
[im 31/42]
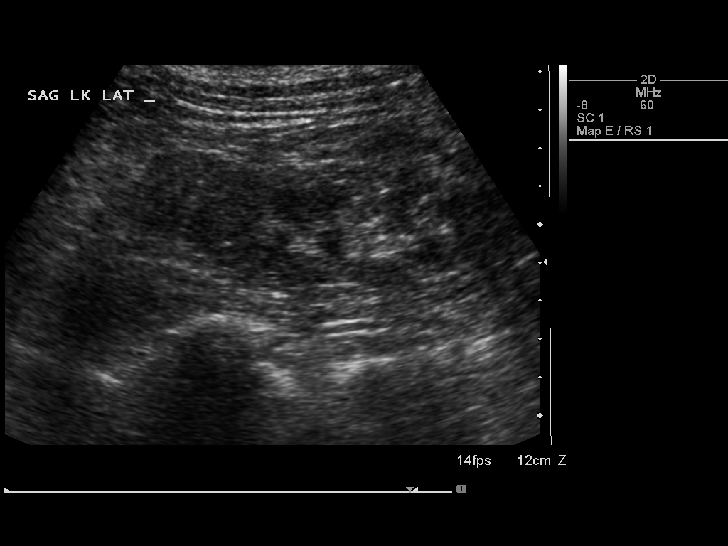
[im 35/42]
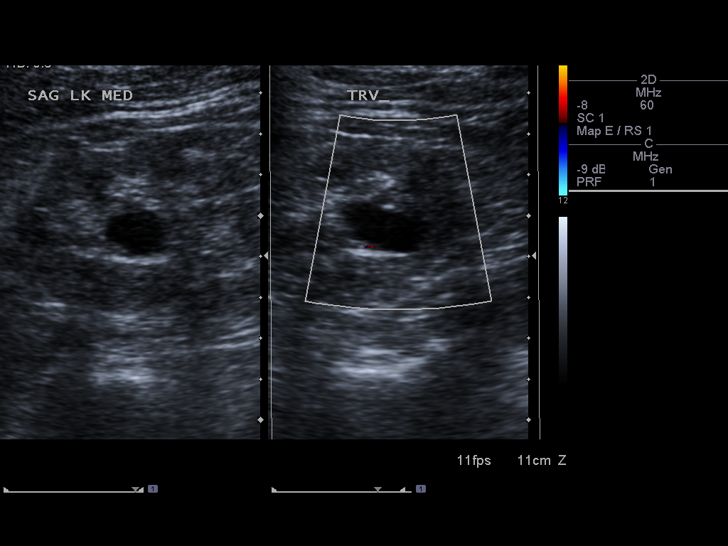
[im 38/42]
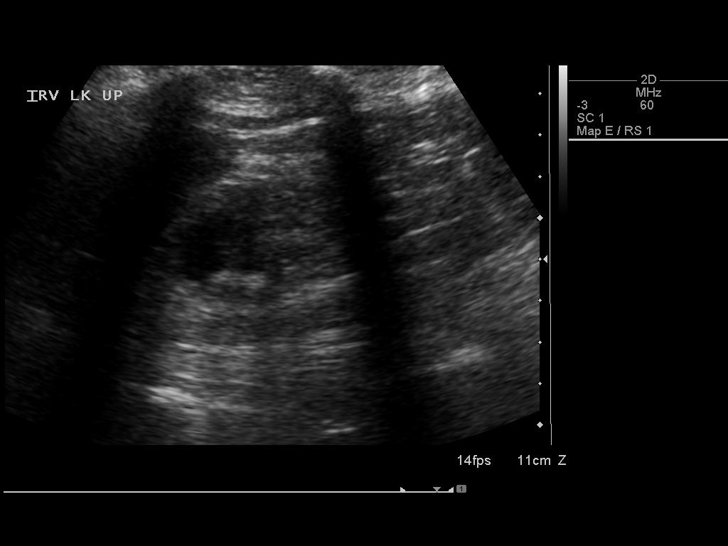
[im 42/42]
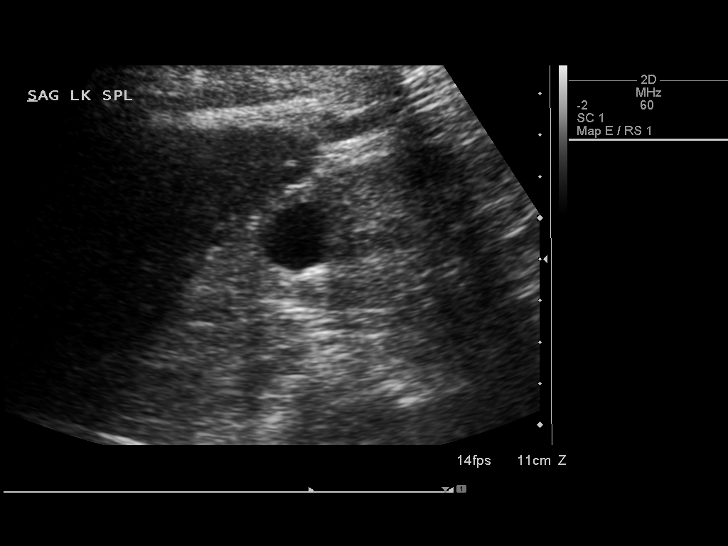

[14 of 25 positions shown; findings below may reference images not displayed]

FINDINGS: Right Kidney:

Length: 11.4 cm. Cortical echogenicity is increased. No
hydronephrosis visualized. Two cysts are identified in the lower
pole. The larger measures 2.3 cm in diameter.

Left Kidney:

Length: 11.7 cm. Echogenicity within normal limits. No
hydronephrosis visualized. Two cysts are identified. The larger
measures 2.7 cm and has a small peripheral calcification.

Bladder:

Appears normal for degree of bladder distention.
IMPRESSION: Negative for hydronephrosis or acute abnormality.

Increased cortical echogenicity bilaterally compatible with medical
renal disease.

## 2017-10-08 IMAGING — CR DG ABDOMEN 1V
1 series · 1 of 1 positions shown · non-contrast
Comparison: Chest x-ray of 11/13/2015 and 02/24/2010

CLINICAL DATA: Nausea for 5 days, weight loss

EXAM:
ABDOMEN - 1 VIEW

[t abdomen supine]
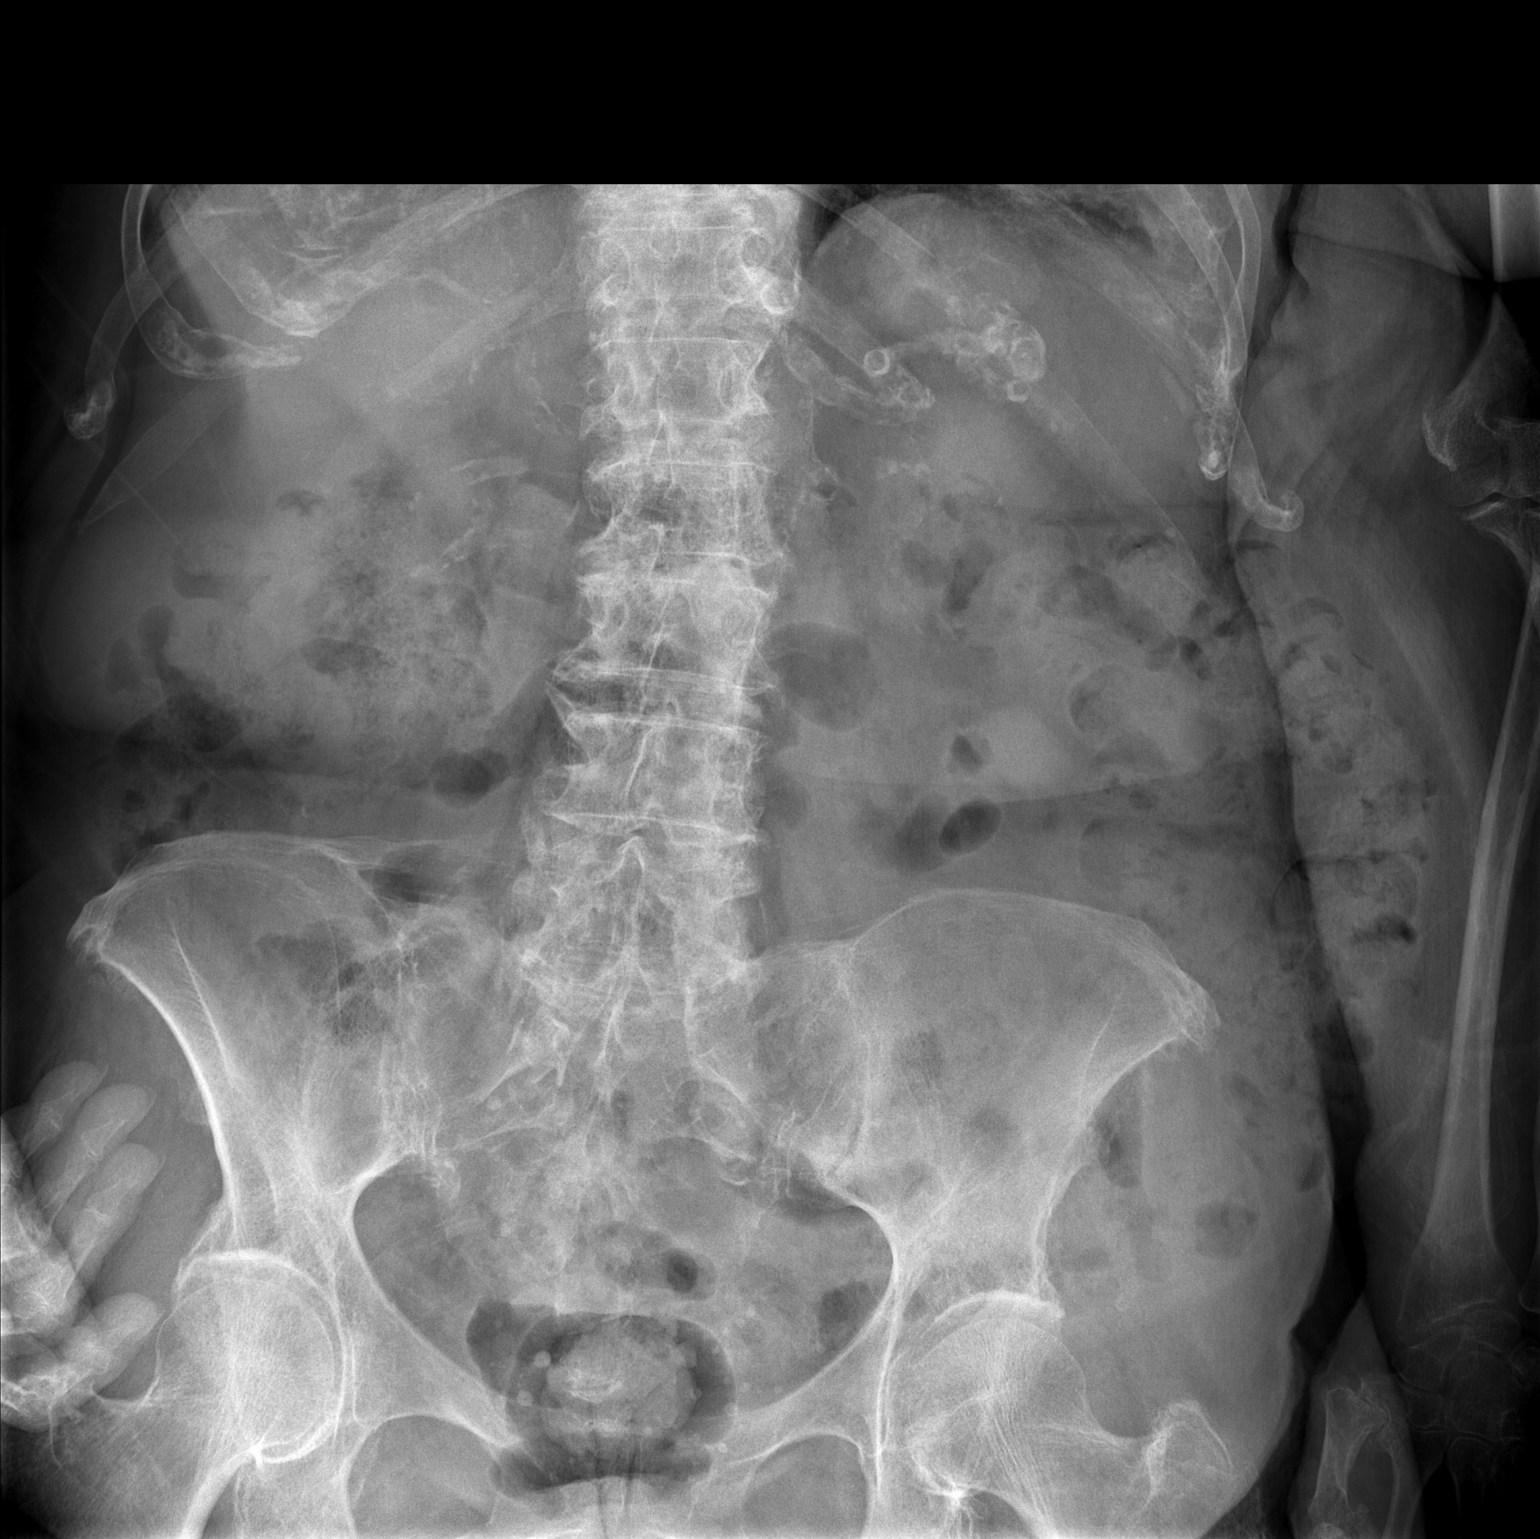

[1 of 1 positions shown; findings below may reference images not displayed]

FINDINGS: A supine film the abdomen shows a moderate amount of feces
throughout the entire colon. No bowel obstruction is seen. No opaque
calculi are noted. There are splenic artery calcifications and
renovascular calcifications present. The bones are osteopenic and
there are degenerative changes throughout the lumbar spine. There is
probable pleural thickening at the lateral left lung base and
correlation with chest x-ray may be helpful.
IMPRESSION: 1. Moderate amount of feces throughout the entire colon. No bowel
obstruction.
2. No opaque calculi.
3. Renovascular and splenic artery calcifications.
4. Left basilar pleural thickening.  Correlate with chest x-ray.

## 2018-05-19 IMAGING — DX DG CHEST 2V
2 series · 2 of 2 positions shown · non-contrast
Comparison: 11/13/2015

CLINICAL DATA: Chest pain, anxiety, history coronary disease post
CABG, hypertension, stroke, acute diastolic heart failure, prior AVR

EXAM:
CHEST  2 VIEW

[chest lat]
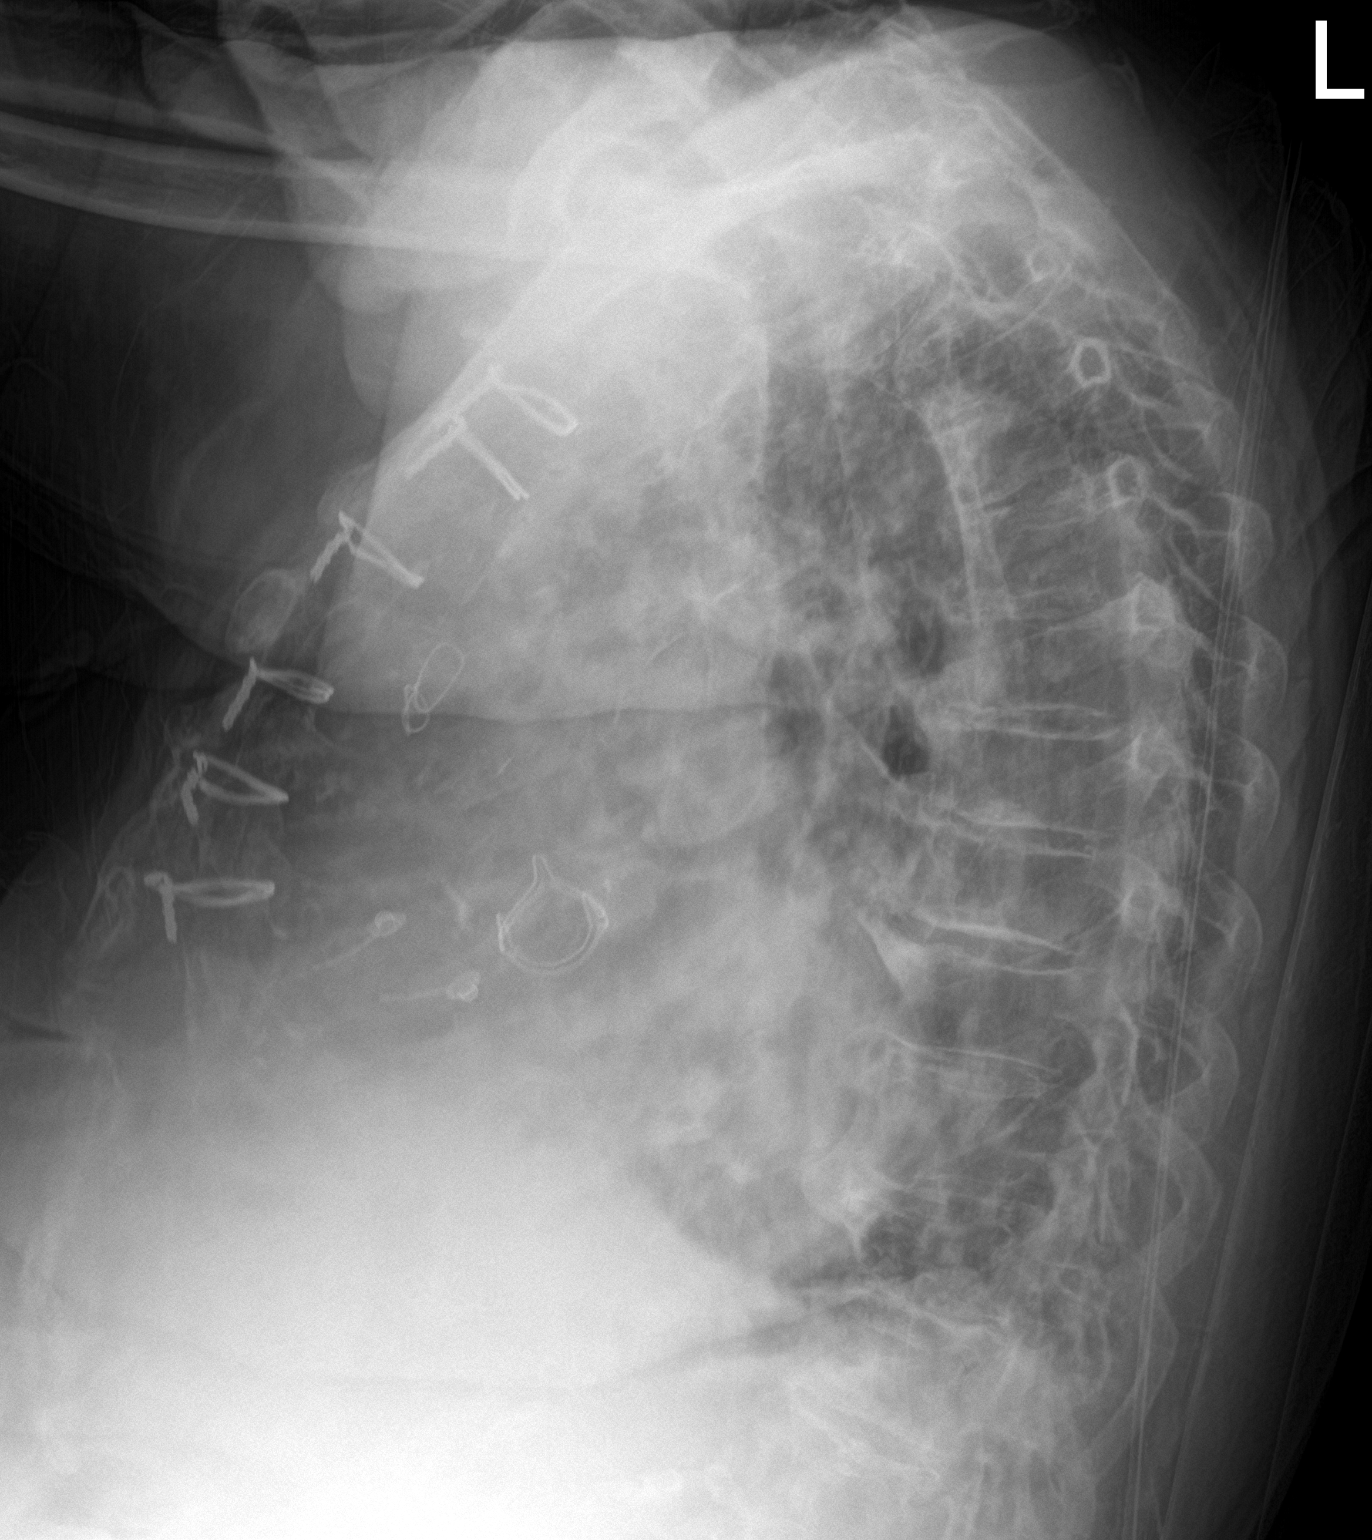

[chest ap]
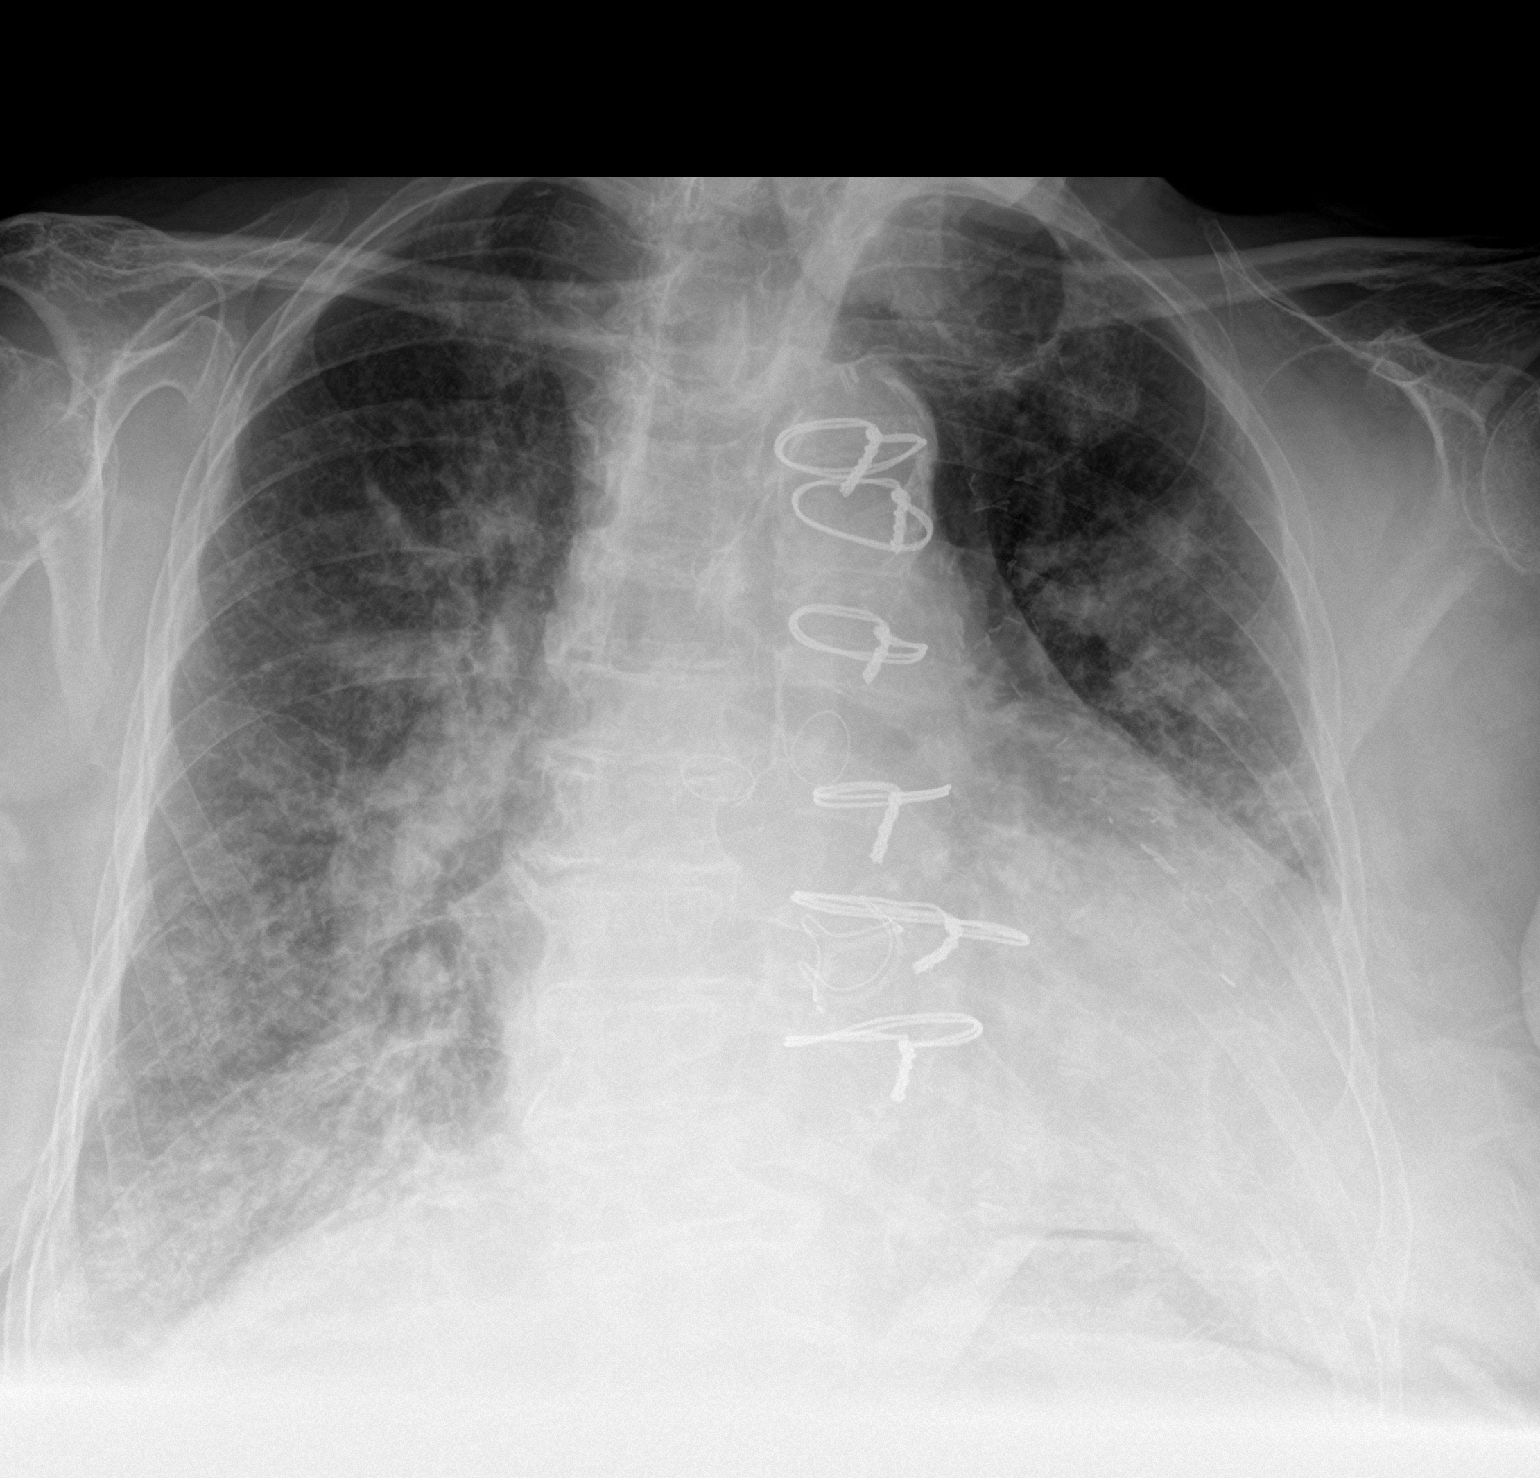

[2 of 2 positions shown; findings below may reference images not displayed]

FINDINGS: Enlargement of cardiac silhouette post CABG and AVR.

Pulmonary vascular congestion.

BILATERAL pulmonary infiltrates favoring pulmonary edema and CHF.

Atherosclerotic calcification aorta.

Rotation to the LEFT on the AP view.

No gross pleural effusion or pneumothorax.

Bones demineralized.
IMPRESSION: Probable CHF.

Post CABG and AVR.

Aortic atherosclerosis.
